# Patient Record
Sex: Female | Born: 1940
Health system: Southern US, Community
[De-identification: ages and names within clinical notes are randomized; demographics above are authoritative.]

## PROBLEM LIST (undated history)

## (undated) DIAGNOSIS — M1990 Unspecified osteoarthritis, unspecified site: Secondary | ICD-10-CM

## (undated) DIAGNOSIS — I10 Essential (primary) hypertension: Secondary | ICD-10-CM

## (undated) DIAGNOSIS — E079 Disorder of thyroid, unspecified: Secondary | ICD-10-CM

## (undated) DIAGNOSIS — Z8739 Personal history of other diseases of the musculoskeletal system and connective tissue: Secondary | ICD-10-CM

## (undated) DIAGNOSIS — I341 Nonrheumatic mitral (valve) prolapse: Secondary | ICD-10-CM

## (undated) DIAGNOSIS — K635 Polyp of colon: Secondary | ICD-10-CM

## (undated) DIAGNOSIS — E785 Hyperlipidemia, unspecified: Secondary | ICD-10-CM

## (undated) DIAGNOSIS — N289 Disorder of kidney and ureter, unspecified: Secondary | ICD-10-CM

## (undated) DIAGNOSIS — E0590 Thyrotoxicosis, unspecified without thyrotoxic crisis or storm: Secondary | ICD-10-CM

## (undated) HISTORY — DX: Disorder of kidney and ureter, unspecified: N28.9

## (undated) HISTORY — PX: CHOLECYSTECTOMY: SHX55

## (undated) HISTORY — DX: Unspecified osteoarthritis, unspecified site: M19.90

## (undated) HISTORY — DX: Hyperlipidemia, unspecified: E78.5

## (undated) HISTORY — DX: Polyp of colon: K63.5

## (undated) HISTORY — DX: Essential (primary) hypertension: I10

## (undated) HISTORY — DX: Disorder of thyroid, unspecified: E07.9

## (undated) HISTORY — DX: Nonrheumatic mitral (valve) prolapse: I34.1

---

## 1999-11-16 ENCOUNTER — Ambulatory Visit (HOSPITAL_COMMUNITY): Admission: RE | Admit: 1999-11-16 | Discharge: 1999-11-16 | Payer: Self-pay | Admitting: Gastroenterology

## 2001-05-20 ENCOUNTER — Encounter: Payer: Self-pay | Admitting: Internal Medicine

## 2001-05-20 ENCOUNTER — Encounter: Admission: RE | Admit: 2001-05-20 | Discharge: 2001-05-20 | Payer: Self-pay | Admitting: Internal Medicine

## 2002-04-05 ENCOUNTER — Other Ambulatory Visit: Admission: RE | Admit: 2002-04-05 | Discharge: 2002-04-05 | Payer: Self-pay | Admitting: Internal Medicine

## 2003-03-22 ENCOUNTER — Other Ambulatory Visit: Admission: RE | Admit: 2003-03-22 | Discharge: 2003-03-22 | Payer: Self-pay | Admitting: Internal Medicine

## 2004-03-27 ENCOUNTER — Other Ambulatory Visit: Admission: RE | Admit: 2004-03-27 | Discharge: 2004-03-27 | Payer: Self-pay | Admitting: Internal Medicine

## 2005-04-02 ENCOUNTER — Other Ambulatory Visit: Admission: RE | Admit: 2005-04-02 | Discharge: 2005-04-02 | Payer: Self-pay | Admitting: Internal Medicine

## 2006-04-10 ENCOUNTER — Other Ambulatory Visit: Admission: RE | Admit: 2006-04-10 | Discharge: 2006-04-10 | Payer: Self-pay | Admitting: Internal Medicine

## 2007-10-24 ENCOUNTER — Encounter: Admission: RE | Admit: 2007-10-24 | Discharge: 2007-10-24 | Payer: Self-pay | Admitting: Internal Medicine

## 2007-11-27 ENCOUNTER — Inpatient Hospital Stay (HOSPITAL_COMMUNITY): Admission: RE | Admit: 2007-11-27 | Discharge: 2007-11-28 | Payer: Self-pay | Admitting: Neurosurgery

## 2007-12-17 HISTORY — PX: NECK SURGERY: SHX720

## 2008-04-29 ENCOUNTER — Encounter: Admission: RE | Admit: 2008-04-29 | Discharge: 2008-04-29 | Payer: Self-pay | Admitting: Internal Medicine

## 2008-07-29 ENCOUNTER — Encounter: Admission: RE | Admit: 2008-07-29 | Discharge: 2008-07-29 | Payer: Self-pay | Admitting: Internal Medicine

## 2008-09-19 ENCOUNTER — Ambulatory Visit: Payer: Self-pay | Admitting: Internal Medicine

## 2008-10-17 ENCOUNTER — Ambulatory Visit: Payer: Self-pay | Admitting: Internal Medicine

## 2009-04-17 ENCOUNTER — Ambulatory Visit: Payer: Self-pay | Admitting: Internal Medicine

## 2009-05-16 ENCOUNTER — Ambulatory Visit: Payer: Self-pay | Admitting: Internal Medicine

## 2009-08-08 ENCOUNTER — Ambulatory Visit: Payer: Self-pay | Admitting: Internal Medicine

## 2009-11-07 ENCOUNTER — Other Ambulatory Visit: Admission: RE | Admit: 2009-11-07 | Discharge: 2009-11-07 | Payer: Self-pay | Admitting: Internal Medicine

## 2009-11-07 ENCOUNTER — Ambulatory Visit: Payer: Self-pay | Admitting: Internal Medicine

## 2009-11-07 LAB — HM PAP SMEAR

## 2009-12-16 HISTORY — PX: BACK SURGERY: SHX140

## 2010-05-08 ENCOUNTER — Encounter: Payer: Self-pay | Admitting: Gastroenterology

## 2010-05-08 ENCOUNTER — Ambulatory Visit: Payer: Self-pay | Admitting: Internal Medicine

## 2010-05-08 ENCOUNTER — Encounter: Admission: RE | Admit: 2010-05-08 | Discharge: 2010-05-08 | Payer: Self-pay | Admitting: Internal Medicine

## 2010-05-18 ENCOUNTER — Ambulatory Visit: Payer: Self-pay | Admitting: Internal Medicine

## 2010-05-20 ENCOUNTER — Encounter: Admission: RE | Admit: 2010-05-20 | Discharge: 2010-05-20 | Payer: Self-pay | Admitting: Internal Medicine

## 2010-06-08 ENCOUNTER — Encounter: Payer: Self-pay | Admitting: Gastroenterology

## 2010-06-08 ENCOUNTER — Inpatient Hospital Stay (HOSPITAL_COMMUNITY): Admission: RE | Admit: 2010-06-08 | Discharge: 2010-06-12 | Payer: Self-pay | Admitting: Neurosurgery

## 2010-12-06 ENCOUNTER — Encounter: Payer: Self-pay | Admitting: Gastroenterology

## 2010-12-11 ENCOUNTER — Ambulatory Visit: Payer: Self-pay | Admitting: Internal Medicine

## 2010-12-11 ENCOUNTER — Encounter: Payer: Self-pay | Admitting: Gastroenterology

## 2010-12-20 ENCOUNTER — Ambulatory Visit
Admission: RE | Admit: 2010-12-20 | Discharge: 2010-12-20 | Payer: Self-pay | Source: Home / Self Care | Attending: Internal Medicine | Admitting: Internal Medicine

## 2010-12-25 ENCOUNTER — Encounter: Payer: Self-pay | Admitting: Gastroenterology

## 2010-12-25 ENCOUNTER — Encounter
Admission: RE | Admit: 2010-12-25 | Discharge: 2010-12-25 | Payer: Self-pay | Source: Home / Self Care | Attending: Internal Medicine | Admitting: Internal Medicine

## 2010-12-26 ENCOUNTER — Encounter
Admission: RE | Admit: 2010-12-26 | Discharge: 2010-12-26 | Payer: Self-pay | Source: Home / Self Care | Attending: Internal Medicine | Admitting: Internal Medicine

## 2010-12-26 ENCOUNTER — Encounter: Payer: Self-pay | Admitting: Gastroenterology

## 2010-12-28 ENCOUNTER — Encounter (INDEPENDENT_AMBULATORY_CARE_PROVIDER_SITE_OTHER): Payer: Self-pay | Admitting: *Deleted

## 2010-12-28 ENCOUNTER — Telehealth (INDEPENDENT_AMBULATORY_CARE_PROVIDER_SITE_OTHER): Payer: Self-pay | Admitting: *Deleted

## 2010-12-28 DIAGNOSIS — R933 Abnormal findings on diagnostic imaging of other parts of digestive tract: Secondary | ICD-10-CM | POA: Insufficient documentation

## 2011-01-03 ENCOUNTER — Encounter: Payer: Self-pay | Admitting: Gastroenterology

## 2011-01-03 ENCOUNTER — Ambulatory Visit (HOSPITAL_COMMUNITY)
Admission: RE | Admit: 2011-01-03 | Discharge: 2011-01-03 | Payer: Self-pay | Source: Home / Self Care | Attending: Gastroenterology | Admitting: Gastroenterology

## 2011-01-16 HISTORY — PX: OTHER SURGICAL HISTORY: SHX169

## 2011-01-17 NOTE — Letter (Signed)
Summary: Date Range: 11-07-09 to 12-11-10/Mary Parke Simmers MD  Date Range: 11-07-09 to 12-11-10/Mary Parke Simmers MD   Imported By: Phillis Knack 01/02/2011 15:19:48  _____________________________________________________________________  External Attachment:    Type:   Image     Comment:   External Document

## 2011-01-17 NOTE — Miscellaneous (Signed)
Summary: rx  Clinical Lists Changes  Medications: Added new medication of CIPROFLOXACIN HCL 500 MG  TABS (CIPROFLOXACIN HCL) Take 1 twice a day for 3 days - Signed Rx of CIPROFLOXACIN HCL 500 MG  TABS (CIPROFLOXACIN HCL) Take 1 twice a day for 3 days;  #6 x 0;  Signed;  Entered by: Milus Banister MD;  Authorized by: Milus Banister MD;  Method used: Print then Give to Patient    Prescriptions: CIPROFLOXACIN HCL 500 MG  TABS (CIPROFLOXACIN HCL) Take 1 twice a day for 3 days  #6 x 0   Entered and Authorized by:   Milus Banister MD   Signed by:   Milus Banister MD on 01/03/2011   Method used:   Print then Give to Patient   RxID:   [id number]

## 2011-01-17 NOTE — Progress Notes (Signed)
Summary: EUS  Phone Note Outgoing Call Call back at Home Phone [phone]   Call placed by: Christian Mate CMA Deborra Medina),  December 28, 2010 1:27 PM Summary of Call: pt scheduled for EUS, reviewed  meds and instructed   she will call with any questions Initial call taken by: Christian Mate CMA ([place]),  December 28, 2010 1:28 PM  New Problems: NONSPECIFIC ABN FINDING RAD & OTH EXAM GI TRACT (ICD-793.4)   New Problems: NONSPECIFIC ABN FINDING RAD & OTH EXAM GI TRACT (ICD-793.4)

## 2011-01-17 NOTE — Procedures (Addendum)
Summary: EUS  ENDOSCOPIC ULTRASOUND PROCEDURE REPORT  PATIENT:  [name], [name]  MR#:  [id number] BIRTHDATE:   1941/12/12   GENDER:   female ENDOSCOPIST:   Milus Banister, MD REFERRED BY: Emeline General, M.D. PROCEDURE DATE:  01/03/2011 PROCEDURE:  Upper EUS w/FNA ASA CLASS:   Class II INDICATIONS: weight loss, anorexia; large solid/cystic mass in pancreatic head  (non-dilated CBD) MEDICATIONS:    Fentanyl 125 mcg IV, Versed 10 mg IV, cipro 400mg  IV  DESCRIPTION OF PROCEDURE:   After the risks benefits and alternatives of the procedure were  explained, informed consent was obtained. The patient was then placed in the left, lateral, decubitus postion and IV sedation was administered. Throughout the procedure, the patient's blood pressure, pulse and oxygen saturations were monitored continuously.  Under direct visualization, the  endoscope was introduced through the mouth and advanced to the second portion of the duodenum.  Water was used as necessary to provide an acoustic interface.  Upon completion of the imaging, water was removed and the patient was sent to the recovery room in satisfactory condition. <<PROCEDUREIMAGES>>              <<OLD IMAGES>>  Endoscopic findings: 1. Normal esophagus, stomach, duodenum  EUS findings: 1. Large solid/cystic mass in region of head of pancreas. This measured at least 7cm across. The solid component was heterogenous, nearly isoechoic, irregular. The cystic component had several thin septea.  The large size of the mass blocked complete visualization of the surrounding blood vessels.  The mass was more vascular than is typical for adenocarcinoma, was sampled with 2 passes with a 22 gauge BS EUS FNA needle. 2. The main pancreatic duct was dilated up to 7.61mm in body of pancreas. 3. Very limited views of liver, spleen, portal and splenic vessels were normal  Impression: Large cystic/solid mass in head of pancreas without significant biliary obstruction or  clear metastatic disease.  The solid component of the mass was not typical appearing for adenocarcinoma (more hyperechoic, more vascular).  FNA performed, await final report.  I am suspicious this is a large IPMN.  I have discussed this with Dr. Renold Genta and will be setting up referral to Dr. Stark Klein to consider resection.  She will complete 3 days of cipro.   _______________________________ Milus Banister, MD  Appended Document: EUS she needs referral to Stark Klein at [facility] [facility] for pancreatic head mass, consider resection  Appended Document: EUS Blanca at [place] not available to speak with I have left her a voicemail asking her to call me with an appointment date and time.  I have faxed the patient records to [facility] [place].  Appended Document: Orders Update/CCS    Clinical Lists Changes  Orders: Added new Test order of [facility] ([facility]) - Signed

## 2011-01-17 NOTE — Letter (Signed)
Summary: Medical Hx/Mary Parke Simmers MD  Medical Hx/Mary Parke Simmers MD   Imported By: Phillis Knack 01/02/2011 15:26:43  _____________________________________________________________________  External Attachment:    Type:   Image     Comment:   External Document

## 2011-01-17 NOTE — Letter (Signed)
Summary: EGD Instructions  [facility]  [place], [place] [address]   Phone: [phone]  Fax: [phone]       Alexandra Martinec    12-Jun-1941    MRN: [id number]       Procedure Day /Date:01/03/11 THURS       Arrival Time: 130 pm     Procedure Time:230 pm     Location of Procedure:                     [facility] ( Outpatient Registration)    PREPARATION FOR ENDOSCOPY   On 01/03/11  THE DAY OF THE PROCEDURE:  1.   No solid foods, milk or milk products are allowed after midnight the night before your procedure.  2.   Do not drink anything colored red or purple.  Avoid juices with pulp.  No orange juice.  3.  You may drink clear liquids until 1030 am , which is 4 hours before your procedure.                                                                                                CLEAR LIQUIDS INCLUDE: Water Jello Ice Popsicles Tea (sugar ok, no milk/cream) Powdered fruit flavored drinks Coffee (sugar ok, no milk/cream) Gatorade Juice: apple, white grape, white cranberry  Lemonade Clear bullion, consomm, broth Carbonated beverages (any kind) Strained chicken noodle soup Hard Candy   MEDICATION INSTRUCTIONS  Unless otherwise instructed, you should take regular prescription medications with a small sip of water as early as possible the morning of your procedure.             OTHER INSTRUCTIONS  You will need a responsible adult at least 71 years of age to accompany you and drive you home.   This person must remain in the waiting room during your procedure.  Wear loose fitting clothing that is easily removed.  Leave jewelry and other valuables at home.  However, you may wish to bring a book to read or an iPod/MP3 player to listen to music as you wait for your procedure to start.  Remove all body piercing jewelry and leave at home.  Total time from sign-in until discharge is approximately 2-3 hours.  You should go  home directly after your procedure and rest.  You can resume normal activities the day after your procedure.  The day of your procedure you should not:   Drive   Make legal decisions   Operate machinery   Drink alcohol   Return to work  You will receive specific instructions about eating, activities and medications before you leave.    The above instructions have been reviewed and explained to me by   _______________________    I fully understand and can verbalize these instructions _____________________________ Date _________

## 2011-01-17 NOTE — Procedures (Signed)
Summary: Endoscopic Ultrasound  Patient: [name] Note: All result statuses are Final unless otherwise noted.  Tests: (1) Endoscopic Ultrasound (EUS)  EUS Endoscopic Ultrasound                             DONE     [facility]     [place], [place]  [id number]           ENDOSCOPIC ULTRASOUND PROCEDURE REPORT           PATIENT:  Gomez, Sierra  MR#:  [id number]     BIRTHDATE:  [birth year]  GENDER:  female     ENDOSCOPIST:  Milus Banister, MD     REFERRED BY:  Emeline General, M.D.     PROCEDURE DATE:  01/03/2011     PROCEDURE:  Upper EUS w/FNA     ASA CLASS:  Class II     INDICATIONS:  weight loss, anorexia; large solid/cystic mass in     pancreatic head  (non-dilated CBD)     MEDICATIONS:   Fentanyl 125 mcg IV, Versed 10 mg IV, cipro 400mg      IV           DESCRIPTION OF PROCEDURE:   After the risks benefits and     alternatives of the procedure were  explained, informed consent     was obtained. The patient was then placed in the left, lateral,     decubitus postion and IV sedation was administered. Throughout the     procedure, the patient's blood pressure, pulse and oxygen     saturations were monitored continuously.  Under direct     visualization, the  endoscope was introduced through the mouth and     advanced to the second portion of the duodenum.  Water was used as     necessary to provide an acoustic interface.  Upon completion of     the imaging, water was removed and the patient was sent to the     recovery room in satisfactory condition.     <<PROCEDUREIMAGES>>           Endoscopic findings:     1. Normal esophagus, stomach, duodenum           EUS findings:     1. Large solid/cystic mass in region of head of pancreas. This     measured at least 7cm across. The solid component was     heterogenous, nearly isoechoic, irregular. The cystic component     had several thin septea.  The large size of the mass blocked     complete  visualization of the surrounding blood vessels.  The mass     was more vascular than is typical for adenocarcinoma, was sampled     with 2 passes with a 22 gauge BS EUS FNA needle.     2. The main pancreatic duct was dilated up to 7.48mm in body of     pancreas.     3. Very limited views of liver, spleen, portal and splenic vessels     were normal           Impression:     Large cystic/solid mass in head of pancreas without significant     biliary obstruction or clear metastatic disease.  The solid     component of the mass was not typical appearing for adenocarcinoma     (  more hyperechoic, more vascular).  FNA performed, await final     report.  I am suspicious this is a large IPMN.  I have discussed     this with Dr. Renold Genta and will be setting up referral to Dr. Stark Klein to consider resection.  She will complete 3 days of cipro.           ______________________________     Milus Banister, MD           n.     eSIGNED:   Milus Banister at 01/03/2011 03:57 PM           Ashby Dawes, [id number]-  Note: An exclamation mark (!) indicates a result that was not dispersed into the flowsheet. Document Creation Date: 01/06/2011 8:24 PM _______________________________________________________________________  (1) Order result status: Final Collection or observation date-time: 01/03/2011 15:35 Requested date-time:  Receipt date-time:  Reported date-time:  Referring Physician:   Ordering Physician: Owens Loffler [phone]) Specimen Source:  Source: Tawanna Cooler Order Number: [id number] Lab site:

## 2011-01-18 ENCOUNTER — Other Ambulatory Visit: Payer: Self-pay | Admitting: General Surgery

## 2011-01-18 ENCOUNTER — Encounter (HOSPITAL_COMMUNITY): Payer: Medicare Other

## 2011-01-18 DIAGNOSIS — K869 Disease of pancreas, unspecified: Secondary | ICD-10-CM | POA: Insufficient documentation

## 2011-01-18 DIAGNOSIS — Z01812 Encounter for preprocedural laboratory examination: Secondary | ICD-10-CM | POA: Insufficient documentation

## 2011-01-18 LAB — COMPREHENSIVE METABOLIC PANEL
ALT: 12 U/L (ref 0–35)
AST: 19 U/L (ref 0–37)
Albumin: 3.7 g/dL (ref 3.5–5.2)
Alkaline Phosphatase: 83 U/L (ref 39–117)
BUN: 20 mg/dL (ref 6–23)
CO2: 28 mEq/L (ref 19–32)
Calcium: 10.7 mg/dL — ABNORMAL HIGH (ref 8.4–10.5)
Chloride: 102 mEq/L (ref 96–112)
Creatinine, Ser: 1.12 mg/dL (ref 0.4–1.2)
GFR calc Af Amer: 58 mL/min — ABNORMAL LOW (ref >60)
GFR calc non Af Amer: 48 mL/min — ABNORMAL LOW (ref >60)
Glucose, Bld: 97 mg/dL (ref 70–99)
Potassium: 4 mEq/L (ref 3.5–5.1)
Sodium: 139 mEq/L (ref 135–145)
Total Bilirubin: 0.8 mg/dL (ref 0.3–1.2)
Total Protein: 6.8 g/dL (ref 6.0–8.3)

## 2011-01-18 LAB — CBC
HCT: 39.3 % (ref 36.0–46.0)
Hemoglobin: 13.7 g/dL (ref 12.0–15.0)
MCH: 30.2 pg (ref 26.0–34.0)
MCHC: 34.9 g/dL (ref 30.0–36.0)
MCV: 86.6 fL (ref 78.0–100.0)
Platelets: 198 10*3/uL (ref 150–400)
RBC: 4.54 MIL/uL (ref 3.87–5.11)
RDW: 13.2 % (ref 11.5–15.5)
WBC: 7 10*3/uL (ref 4.0–10.5)

## 2011-01-18 LAB — DIFFERENTIAL
Basophils Absolute: 0 10*3/uL (ref 0.0–0.1)
Basophils Relative: 0 % (ref 0–1)
Eosinophils Absolute: 0.1 10*3/uL (ref 0.0–0.7)
Eosinophils Relative: 1 % (ref 0–5)
Lymphocytes Relative: 35 % (ref 12–46)
Lymphs Abs: 2.5 10*3/uL (ref 0.7–4.0)
Monocytes Absolute: 0.7 10*3/uL (ref 0.1–1.0)
Monocytes Relative: 9 % (ref 3–12)
Neutro Abs: 3.8 10*3/uL (ref 1.7–7.7)
Neutrophils Relative %: 54 % (ref 43–77)

## 2011-01-18 LAB — PROTIME-INR
INR: 0.96 (ref 0.00–1.49)
Prothrombin Time: 13 seconds (ref 11.6–15.2)

## 2011-01-18 LAB — SURGICAL PCR SCREEN
MRSA, PCR: NEGATIVE
Staphylococcus aureus: NEGATIVE

## 2011-01-18 LAB — APTT: aPTT: 30 seconds (ref 24–37)

## 2011-01-19 LAB — ABO/RH: ABO/RH(D): B POS

## 2011-01-19 LAB — CEA: CEA: 1.4 ng/mL (ref 0.0–5.0)

## 2011-01-19 LAB — CANCER ANTIGEN 19-9: CA 19-9: 201 U/mL — ABNORMAL HIGH (ref <35.0)

## 2011-01-22 ENCOUNTER — Inpatient Hospital Stay (HOSPITAL_COMMUNITY): Payer: Medicare Other

## 2011-01-22 ENCOUNTER — Other Ambulatory Visit: Payer: Self-pay | Admitting: General Surgery

## 2011-01-22 ENCOUNTER — Inpatient Hospital Stay (HOSPITAL_COMMUNITY)
Admission: RE | Admit: 2011-01-22 | Discharge: 2011-02-04 | DRG: 406 | Disposition: A | Discharge status: Skilled Nursing Facility | Payer: Medicare Other | Attending: General Surgery | Admitting: General Surgery

## 2011-01-22 DIAGNOSIS — F3289 Other specified depressive episodes: Secondary | ICD-10-CM | POA: Diagnosis present

## 2011-01-22 DIAGNOSIS — I10 Essential (primary) hypertension: Secondary | ICD-10-CM | POA: Diagnosis present

## 2011-01-22 DIAGNOSIS — Z981 Arthrodesis status: Secondary | ICD-10-CM

## 2011-01-22 DIAGNOSIS — R7309 Other abnormal glucose: Secondary | ICD-10-CM | POA: Diagnosis not present

## 2011-01-22 DIAGNOSIS — E8809 Other disorders of plasma-protein metabolism, not elsewhere classified: Secondary | ICD-10-CM | POA: Diagnosis not present

## 2011-01-22 DIAGNOSIS — K560 Paralytic ileus: Secondary | ICD-10-CM | POA: Diagnosis not present

## 2011-01-22 DIAGNOSIS — K8010 Calculus of gallbladder with chronic cholecystitis without obstruction: Secondary | ICD-10-CM | POA: Diagnosis present

## 2011-01-22 DIAGNOSIS — K869 Disease of pancreas, unspecified: Secondary | ICD-10-CM | POA: Diagnosis present

## 2011-01-22 DIAGNOSIS — D136 Benign neoplasm of pancreas: Principal | ICD-10-CM | POA: Diagnosis present

## 2011-01-22 DIAGNOSIS — E876 Hypokalemia: Secondary | ICD-10-CM | POA: Diagnosis not present

## 2011-01-22 DIAGNOSIS — E039 Hypothyroidism, unspecified: Secondary | ICD-10-CM | POA: Diagnosis present

## 2011-01-22 DIAGNOSIS — Z5189 Encounter for other specified aftercare: Secondary | ICD-10-CM

## 2011-01-22 DIAGNOSIS — Z01812 Encounter for preprocedural laboratory examination: Secondary | ICD-10-CM

## 2011-01-22 DIAGNOSIS — M109 Gout, unspecified: Secondary | ICD-10-CM | POA: Diagnosis present

## 2011-01-22 DIAGNOSIS — D62 Acute posthemorrhagic anemia: Secondary | ICD-10-CM | POA: Diagnosis not present

## 2011-01-22 DIAGNOSIS — I959 Hypotension, unspecified: Secondary | ICD-10-CM | POA: Diagnosis not present

## 2011-01-22 DIAGNOSIS — F329 Major depressive disorder, single episode, unspecified: Secondary | ICD-10-CM | POA: Diagnosis present

## 2011-01-22 LAB — PROTIME-INR
INR: 1.23 (ref 0.00–1.49)
Prothrombin Time: 15.7 seconds — ABNORMAL HIGH (ref 11.6–15.2)

## 2011-01-22 LAB — CBC
HCT: 27.7 % — ABNORMAL LOW (ref 36.0–46.0)
Hemoglobin: 9.5 g/dL — ABNORMAL LOW (ref 12.0–15.0)
MCH: 29.7 pg (ref 26.0–34.0)
MCHC: 34.3 g/dL (ref 30.0–36.0)
MCV: 86.6 fL (ref 78.0–100.0)
Platelets: 138 10*3/uL — ABNORMAL LOW (ref 150–400)
RBC: 3.2 MIL/uL — ABNORMAL LOW (ref 3.87–5.11)
RDW: 13.1 % (ref 11.5–15.5)
WBC: 10.2 10*3/uL (ref 4.0–10.5)

## 2011-01-22 LAB — COMPREHENSIVE METABOLIC PANEL
ALT: 21 U/L (ref 0–35)
AST: 33 U/L (ref 0–37)
Albumin: 2.2 g/dL — ABNORMAL LOW (ref 3.5–5.2)
Alkaline Phosphatase: 50 U/L (ref 39–117)
BUN: 16 mg/dL (ref 6–23)
CO2: 26 mEq/L (ref 19–32)
Calcium: 8.6 mg/dL (ref 8.4–10.5)
Chloride: 110 mEq/L (ref 96–112)
Creatinine, Ser: 1 mg/dL (ref 0.4–1.2)
GFR calc Af Amer: >60 mL/min (ref >60)
GFR calc non Af Amer: 55 mL/min — ABNORMAL LOW (ref >60)
Glucose, Bld: 140 mg/dL — ABNORMAL HIGH (ref 70–99)
Potassium: 3.4 mEq/L — ABNORMAL LOW (ref 3.5–5.1)
Sodium: 140 mEq/L (ref 135–145)
Total Bilirubin: 0.4 mg/dL (ref 0.3–1.2)
Total Protein: 3.8 g/dL — ABNORMAL LOW (ref 6.0–8.3)

## 2011-01-22 LAB — MAGNESIUM: Magnesium: 1.4 mg/dL — ABNORMAL LOW (ref 1.5–2.5)

## 2011-01-22 LAB — GLUCOSE, CAPILLARY: Glucose-Capillary: 116 mg/dL — ABNORMAL HIGH (ref 70–99)

## 2011-01-22 LAB — HEMOGLOBIN A1C
Hgb A1c MFr Bld: 5.8 % — ABNORMAL HIGH (ref <5.7)
Mean Plasma Glucose: 120 mg/dL — ABNORMAL HIGH (ref <117)

## 2011-01-22 LAB — APTT: aPTT: 28 seconds (ref 24–37)

## 2011-01-22 LAB — PHOSPHORUS: Phosphorus: 3.5 mg/dL (ref 2.3–4.6)

## 2011-01-23 LAB — CBC
HCT: 27.3 % — ABNORMAL LOW (ref 36.0–46.0)
HCT: 23.1 % — ABNORMAL LOW (ref 36.0–46.0)
Hemoglobin: 9.3 g/dL — ABNORMAL LOW (ref 12.0–15.0)
Hemoglobin: 7.8 g/dL — ABNORMAL LOW (ref 12.0–15.0)
MCH: 29.8 pg (ref 26.0–34.0)
MCH: 30.1 pg (ref 26.0–34.0)
MCHC: 34.1 g/dL (ref 30.0–36.0)
MCHC: 33.8 g/dL (ref 30.0–36.0)
MCV: 87.5 fL (ref 78.0–100.0)
MCV: 89.2 fL (ref 78.0–100.0)
Platelets: 131 10*3/uL — ABNORMAL LOW (ref 150–400)
Platelets: 112 10*3/uL — ABNORMAL LOW (ref 150–400)
RBC: 3.12 MIL/uL — ABNORMAL LOW (ref 3.87–5.11)
RBC: 2.59 MIL/uL — ABNORMAL LOW (ref 3.87–5.11)
RDW: 13.2 % (ref 11.5–15.5)
RDW: 13.3 % (ref 11.5–15.5)
WBC: 7.5 10*3/uL (ref 4.0–10.5)
WBC: 7 10*3/uL (ref 4.0–10.5)

## 2011-01-23 LAB — COMPREHENSIVE METABOLIC PANEL
ALT: 21 U/L (ref 0–35)
AST: 34 U/L (ref 0–37)
Albumin: 2 g/dL — ABNORMAL LOW (ref 3.5–5.2)
Alkaline Phosphatase: 45 U/L (ref 39–117)
BUN: 12 mg/dL (ref 6–23)
CO2: 28 mEq/L (ref 19–32)
Calcium: 8.5 mg/dL (ref 8.4–10.5)
Chloride: 107 mEq/L (ref 96–112)
Creatinine, Ser: 0.78 mg/dL (ref 0.4–1.2)
GFR calc Af Amer: >60 mL/min (ref >60)
GFR calc non Af Amer: >60 mL/min (ref >60)
Glucose, Bld: 104 mg/dL — ABNORMAL HIGH (ref 70–99)
Potassium: 3.6 mEq/L (ref 3.5–5.1)
Sodium: 140 mEq/L (ref 135–145)
Total Bilirubin: 0.7 mg/dL (ref 0.3–1.2)
Total Protein: 3.8 g/dL — ABNORMAL LOW (ref 6.0–8.3)

## 2011-01-23 LAB — GLUCOSE, CAPILLARY
Glucose-Capillary: 101 mg/dL — ABNORMAL HIGH (ref 70–99)
Glucose-Capillary: 107 mg/dL — ABNORMAL HIGH (ref 70–99)
Glucose-Capillary: 107 mg/dL — ABNORMAL HIGH (ref 70–99)
Glucose-Capillary: 113 mg/dL — ABNORMAL HIGH (ref 70–99)
Glucose-Capillary: 82 mg/dL (ref 70–99)
Glucose-Capillary: 95 mg/dL (ref 70–99)

## 2011-01-23 LAB — PHOSPHORUS: Phosphorus: 3.4 mg/dL (ref 2.3–4.6)

## 2011-01-23 LAB — PROTIME-INR
INR: 1.19 (ref 0.00–1.49)
Prothrombin Time: 15.3 seconds — ABNORMAL HIGH (ref 11.6–15.2)

## 2011-01-23 LAB — MAGNESIUM: Magnesium: 1.5 mg/dL (ref 1.5–2.5)

## 2011-01-23 LAB — APTT: aPTT: 31 seconds (ref 24–37)

## 2011-01-24 LAB — CBC
HCT: 21.5 % — ABNORMAL LOW (ref 36.0–46.0)
HCT: 30.2 % — ABNORMAL LOW (ref 36.0–46.0)
Hemoglobin: 7.4 g/dL — ABNORMAL LOW (ref 12.0–15.0)
Hemoglobin: 10.3 g/dL — ABNORMAL LOW (ref 12.0–15.0)
MCH: 30.6 pg (ref 26.0–34.0)
MCH: 30.1 pg (ref 26.0–34.0)
MCHC: 34.4 g/dL (ref 30.0–36.0)
MCHC: 34.1 g/dL (ref 30.0–36.0)
MCV: 88.8 fL (ref 78.0–100.0)
MCV: 88.3 fL (ref 78.0–100.0)
Platelets: 99 10*3/uL — ABNORMAL LOW (ref 150–400)
Platelets: 106 10*3/uL — ABNORMAL LOW (ref 150–400)
RBC: 2.42 MIL/uL — ABNORMAL LOW (ref 3.87–5.11)
RBC: 3.42 MIL/uL — ABNORMAL LOW (ref 3.87–5.11)
RDW: 13.6 % (ref 11.5–15.5)
RDW: 13.7 % (ref 11.5–15.5)
WBC: 5.8 10*3/uL (ref 4.0–10.5)
WBC: 6.5 10*3/uL (ref 4.0–10.5)

## 2011-01-24 LAB — COMPREHENSIVE METABOLIC PANEL
ALT: 21 U/L (ref 0–35)
AST: 37 U/L (ref 0–37)
Albumin: 2.6 g/dL — ABNORMAL LOW (ref 3.5–5.2)
Alkaline Phosphatase: 37 U/L — ABNORMAL LOW (ref 39–117)
BUN: 8 mg/dL (ref 6–23)
CO2: 26 mEq/L (ref 19–32)
Calcium: 8.9 mg/dL (ref 8.4–10.5)
Chloride: 112 mEq/L (ref 96–112)
Creatinine, Ser: 0.7 mg/dL (ref 0.4–1.2)
GFR calc Af Amer: >60 mL/min (ref >60)
GFR calc non Af Amer: >60 mL/min (ref >60)
Glucose, Bld: 101 mg/dL — ABNORMAL HIGH (ref 70–99)
Potassium: 4.2 mEq/L (ref 3.5–5.1)
Sodium: 140 mEq/L (ref 135–145)
Total Bilirubin: 0.5 mg/dL (ref 0.3–1.2)
Total Protein: 4.1 g/dL — ABNORMAL LOW (ref 6.0–8.3)

## 2011-01-24 LAB — GLUCOSE, CAPILLARY
Glucose-Capillary: 106 mg/dL — ABNORMAL HIGH (ref 70–99)
Glucose-Capillary: 110 mg/dL — ABNORMAL HIGH (ref 70–99)
Glucose-Capillary: 112 mg/dL — ABNORMAL HIGH (ref 70–99)
Glucose-Capillary: 96 mg/dL (ref 70–99)
Glucose-Capillary: 83 mg/dL (ref 70–99)
Glucose-Capillary: 117 mg/dL — ABNORMAL HIGH (ref 70–99)

## 2011-01-25 ENCOUNTER — Inpatient Hospital Stay (HOSPITAL_COMMUNITY): Payer: Medicare Other

## 2011-01-25 LAB — CROSSMATCH
ABO/RH(D): B POS
Antibody Screen: NEGATIVE
Unit division: 0
Unit division: 0
Unit division: 0
Unit division: 0

## 2011-01-25 LAB — CBC
HCT: 31.5 % — ABNORMAL LOW (ref 36.0–46.0)
Hemoglobin: 10.7 g/dL — ABNORMAL LOW (ref 12.0–15.0)
MCH: 29.8 pg (ref 26.0–34.0)
MCHC: 34 g/dL (ref 30.0–36.0)
MCV: 87.7 fL (ref 78.0–100.0)
Platelets: 121 10*3/uL — ABNORMAL LOW (ref 150–400)
RBC: 3.59 MIL/uL — ABNORMAL LOW (ref 3.87–5.11)
RDW: 13.4 % (ref 11.5–15.5)
WBC: 6.9 10*3/uL (ref 4.0–10.5)

## 2011-01-25 LAB — PHOSPHORUS: Phosphorus: 2 mg/dL — ABNORMAL LOW (ref 2.3–4.6)

## 2011-01-25 LAB — BASIC METABOLIC PANEL
BUN: 7 mg/dL (ref 6–23)
CO2: 25 mEq/L (ref 19–32)
Calcium: 9 mg/dL (ref 8.4–10.5)
Chloride: 109 mEq/L (ref 96–112)
Creatinine, Ser: 0.83 mg/dL (ref 0.4–1.2)
GFR calc Af Amer: >60 mL/min (ref >60)
GFR calc non Af Amer: >60 mL/min (ref >60)
Glucose, Bld: 108 mg/dL — ABNORMAL HIGH (ref 70–99)
Potassium: 4 mEq/L (ref 3.5–5.1)
Sodium: 140 mEq/L (ref 135–145)

## 2011-01-25 LAB — GLUCOSE, CAPILLARY
Glucose-Capillary: 102 mg/dL — ABNORMAL HIGH (ref 70–99)
Glucose-Capillary: 115 mg/dL — ABNORMAL HIGH (ref 70–99)
Glucose-Capillary: 139 mg/dL — ABNORMAL HIGH (ref 70–99)
Glucose-Capillary: 107 mg/dL — ABNORMAL HIGH (ref 70–99)
Glucose-Capillary: 102 mg/dL — ABNORMAL HIGH (ref 70–99)
Glucose-Capillary: 108 mg/dL — ABNORMAL HIGH (ref 70–99)

## 2011-01-25 LAB — MAGNESIUM: Magnesium: 1.8 mg/dL (ref 1.5–2.5)

## 2011-01-26 LAB — CBC
HCT: 32.3 % — ABNORMAL LOW (ref 36.0–46.0)
Hemoglobin: 11 g/dL — ABNORMAL LOW (ref 12.0–15.0)
MCH: 30.1 pg (ref 26.0–34.0)
MCHC: 34.1 g/dL (ref 30.0–36.0)
MCV: 88.3 fL (ref 78.0–100.0)
Platelets: 131 10*3/uL — ABNORMAL LOW (ref 150–400)
RBC: 3.66 MIL/uL — ABNORMAL LOW (ref 3.87–5.11)
RDW: 13.3 % (ref 11.5–15.5)
WBC: 6.4 10*3/uL (ref 4.0–10.5)

## 2011-01-26 LAB — BASIC METABOLIC PANEL
BUN: 4 mg/dL — ABNORMAL LOW (ref 6–23)
CO2: 26 mEq/L (ref 19–32)
Calcium: 9.1 mg/dL (ref 8.4–10.5)
Chloride: 111 mEq/L (ref 96–112)
Creatinine, Ser: 0.77 mg/dL (ref 0.4–1.2)
GFR calc Af Amer: >60 mL/min (ref >60)
GFR calc non Af Amer: >60 mL/min (ref >60)
Glucose, Bld: 106 mg/dL — ABNORMAL HIGH (ref 70–99)
Potassium: 4.3 mEq/L (ref 3.5–5.1)
Sodium: 140 mEq/L (ref 135–145)

## 2011-01-26 LAB — GLUCOSE, CAPILLARY
Glucose-Capillary: 104 mg/dL — ABNORMAL HIGH (ref 70–99)
Glucose-Capillary: 106 mg/dL — ABNORMAL HIGH (ref 70–99)
Glucose-Capillary: 107 mg/dL — ABNORMAL HIGH (ref 70–99)
Glucose-Capillary: 109 mg/dL — ABNORMAL HIGH (ref 70–99)
Glucose-Capillary: 112 mg/dL — ABNORMAL HIGH (ref 70–99)
Glucose-Capillary: 116 mg/dL — ABNORMAL HIGH (ref 70–99)

## 2011-01-26 LAB — MAGNESIUM: Magnesium: 1.7 mg/dL (ref 1.5–2.5)

## 2011-01-26 LAB — PHOSPHORUS: Phosphorus: 2.7 mg/dL (ref 2.3–4.6)

## 2011-01-27 LAB — GLUCOSE, CAPILLARY
Glucose-Capillary: 100 mg/dL — ABNORMAL HIGH (ref 70–99)
Glucose-Capillary: 102 mg/dL — ABNORMAL HIGH (ref 70–99)
Glucose-Capillary: 103 mg/dL — ABNORMAL HIGH (ref 70–99)
Glucose-Capillary: 105 mg/dL — ABNORMAL HIGH (ref 70–99)
Glucose-Capillary: 107 mg/dL — ABNORMAL HIGH (ref 70–99)
Glucose-Capillary: 108 mg/dL — ABNORMAL HIGH (ref 70–99)
Glucose-Capillary: 99 mg/dL (ref 70–99)

## 2011-01-28 LAB — COMPREHENSIVE METABOLIC PANEL
ALT: 12 U/L (ref 0–35)
AST: 17 U/L (ref 0–37)
Albumin: 2.4 g/dL — ABNORMAL LOW (ref 3.5–5.2)
Alkaline Phosphatase: 57 U/L (ref 39–117)
BUN: 5 mg/dL — ABNORMAL LOW (ref 6–23)
CO2: 25 mEq/L (ref 19–32)
Calcium: 9.5 mg/dL (ref 8.4–10.5)
Chloride: 110 mEq/L (ref 96–112)
Creatinine, Ser: 0.74 mg/dL (ref 0.4–1.2)
GFR calc Af Amer: >60 mL/min (ref >60)
GFR calc non Af Amer: >60 mL/min (ref >60)
Glucose, Bld: 105 mg/dL — ABNORMAL HIGH (ref 70–99)
Potassium: 4.5 mEq/L (ref 3.5–5.1)
Sodium: 140 mEq/L (ref 135–145)
Total Bilirubin: 0.4 mg/dL (ref 0.3–1.2)
Total Protein: 4.6 g/dL — ABNORMAL LOW (ref 6.0–8.3)

## 2011-01-28 LAB — GLUCOSE, CAPILLARY
Glucose-Capillary: 108 mg/dL — ABNORMAL HIGH (ref 70–99)
Glucose-Capillary: 101 mg/dL — ABNORMAL HIGH (ref 70–99)
Glucose-Capillary: 113 mg/dL — ABNORMAL HIGH (ref 70–99)
Glucose-Capillary: 100 mg/dL — ABNORMAL HIGH (ref 70–99)
Glucose-Capillary: 90 mg/dL (ref 70–99)

## 2011-01-28 LAB — CBC
HCT: 33 % — ABNORMAL LOW (ref 36.0–46.0)
Hemoglobin: 11.1 g/dL — ABNORMAL LOW (ref 12.0–15.0)
MCH: 30 pg (ref 26.0–34.0)
MCHC: 33.6 g/dL (ref 30.0–36.0)
MCV: 89.2 fL (ref 78.0–100.0)
Platelets: 141 10*3/uL — ABNORMAL LOW (ref 150–400)
RBC: 3.7 MIL/uL — ABNORMAL LOW (ref 3.87–5.11)
RDW: 13 % (ref 11.5–15.5)
WBC: 5.5 10*3/uL (ref 4.0–10.5)

## 2011-01-28 LAB — PREALBUMIN: Prealbumin: 11.7 mg/dL — ABNORMAL LOW (ref 17.0–34.0)

## 2011-01-29 LAB — GLUCOSE, CAPILLARY
Glucose-Capillary: 101 mg/dL — ABNORMAL HIGH (ref 70–99)
Glucose-Capillary: 101 mg/dL — ABNORMAL HIGH (ref 70–99)
Glucose-Capillary: 102 mg/dL — ABNORMAL HIGH (ref 70–99)
Glucose-Capillary: 114 mg/dL — ABNORMAL HIGH (ref 70–99)
Glucose-Capillary: 116 mg/dL — ABNORMAL HIGH (ref 70–99)
Glucose-Capillary: 60 mg/dL — ABNORMAL LOW (ref 70–99)
Glucose-Capillary: 96 mg/dL (ref 70–99)

## 2011-01-29 LAB — CBC
HCT: 32.3 % — ABNORMAL LOW (ref 36.0–46.0)
Hemoglobin: 10.8 g/dL — ABNORMAL LOW (ref 12.0–15.0)
MCH: 29.7 pg (ref 26.0–34.0)
MCHC: 33.4 g/dL (ref 30.0–36.0)
MCV: 88.7 fL (ref 78.0–100.0)
Platelets: 162 10*3/uL (ref 150–400)
RBC: 3.64 MIL/uL — ABNORMAL LOW (ref 3.87–5.11)
RDW: 13.1 % (ref 11.5–15.5)
WBC: 4.8 10*3/uL (ref 4.0–10.5)

## 2011-01-29 LAB — PHOSPHORUS: Phosphorus: 3.2 mg/dL (ref 2.3–4.6)

## 2011-01-29 LAB — BASIC METABOLIC PANEL
BUN: 7 mg/dL (ref 6–23)
CO2: 25 mEq/L (ref 19–32)
Calcium: 9.3 mg/dL (ref 8.4–10.5)
Chloride: 110 mEq/L (ref 96–112)
Creatinine, Ser: 0.83 mg/dL (ref 0.4–1.2)
GFR calc Af Amer: >60 mL/min (ref >60)
GFR calc non Af Amer: >60 mL/min (ref >60)
Glucose, Bld: 98 mg/dL (ref 70–99)
Potassium: 4 mEq/L (ref 3.5–5.1)
Sodium: 140 mEq/L (ref 135–145)

## 2011-01-29 LAB — MAGNESIUM: Magnesium: 1.6 mg/dL (ref 1.5–2.5)

## 2011-01-30 LAB — CBC
HCT: 31.8 % — ABNORMAL LOW (ref 36.0–46.0)
Hemoglobin: 10.6 g/dL — ABNORMAL LOW (ref 12.0–15.0)
MCH: 29.8 pg (ref 26.0–34.0)
MCHC: 33.3 g/dL (ref 30.0–36.0)
MCV: 89.3 fL (ref 78.0–100.0)
Platelets: 175 10*3/uL (ref 150–400)
RBC: 3.56 MIL/uL — ABNORMAL LOW (ref 3.87–5.11)
RDW: 13 % (ref 11.5–15.5)
WBC: 5 10*3/uL (ref 4.0–10.5)

## 2011-01-30 LAB — GLUCOSE, CAPILLARY
Glucose-Capillary: 110 mg/dL — ABNORMAL HIGH (ref 70–99)
Glucose-Capillary: 118 mg/dL — ABNORMAL HIGH (ref 70–99)
Glucose-Capillary: 102 mg/dL — ABNORMAL HIGH (ref 70–99)
Glucose-Capillary: 109 mg/dL — ABNORMAL HIGH (ref 70–99)

## 2011-01-30 LAB — COMPREHENSIVE METABOLIC PANEL
ALT: 13 U/L (ref 0–35)
AST: 16 U/L (ref 0–37)
Albumin: 2.4 g/dL — ABNORMAL LOW (ref 3.5–5.2)
Alkaline Phosphatase: 70 U/L (ref 39–117)
BUN: 7 mg/dL (ref 6–23)
CO2: 25 mEq/L (ref 19–32)
Calcium: 9.4 mg/dL (ref 8.4–10.5)
Chloride: 111 mEq/L (ref 96–112)
Creatinine, Ser: 0.83 mg/dL (ref 0.4–1.2)
GFR calc Af Amer: >60 mL/min (ref >60)
GFR calc non Af Amer: >60 mL/min (ref >60)
Glucose, Bld: 104 mg/dL — ABNORMAL HIGH (ref 70–99)
Potassium: 4 mEq/L (ref 3.5–5.1)
Sodium: 141 mEq/L (ref 135–145)
Total Bilirubin: 0.4 mg/dL (ref 0.3–1.2)
Total Protein: 4.7 g/dL — ABNORMAL LOW (ref 6.0–8.3)

## 2011-01-31 LAB — BASIC METABOLIC PANEL
BUN: 5 mg/dL — ABNORMAL LOW (ref 6–23)
CO2: 28 mEq/L (ref 19–32)
Calcium: 9.9 mg/dL (ref 8.4–10.5)
Chloride: 111 mEq/L (ref 96–112)
Creatinine, Ser: 0.91 mg/dL (ref 0.4–1.2)
GFR calc Af Amer: >60 mL/min (ref >60)
GFR calc non Af Amer: >60 mL/min (ref >60)
Glucose, Bld: 94 mg/dL (ref 70–99)
Potassium: 4.4 mEq/L (ref 3.5–5.1)
Sodium: 146 mEq/L — ABNORMAL HIGH (ref 135–145)

## 2011-01-31 LAB — CBC
HCT: 33 % — ABNORMAL LOW (ref 36.0–46.0)
Hemoglobin: 11 g/dL — ABNORMAL LOW (ref 12.0–15.0)
MCH: 29.6 pg (ref 26.0–34.0)
MCHC: 33.3 g/dL (ref 30.0–36.0)
MCV: 88.7 fL (ref 78.0–100.0)
Platelets: 197 10*3/uL (ref 150–400)
RBC: 3.72 MIL/uL — ABNORMAL LOW (ref 3.87–5.11)
RDW: 12.9 % (ref 11.5–15.5)
WBC: 5.9 10*3/uL (ref 4.0–10.5)

## 2011-01-31 LAB — GLUCOSE, CAPILLARY
Glucose-Capillary: 98 mg/dL (ref 70–99)
Glucose-Capillary: 110 mg/dL — ABNORMAL HIGH (ref 70–99)
Glucose-Capillary: 129 mg/dL — ABNORMAL HIGH (ref 70–99)
Glucose-Capillary: 105 mg/dL — ABNORMAL HIGH (ref 70–99)

## 2011-02-01 LAB — GLUCOSE, CAPILLARY
Glucose-Capillary: 124 mg/dL — ABNORMAL HIGH (ref 70–99)
Glucose-Capillary: 102 mg/dL — ABNORMAL HIGH (ref 70–99)
Glucose-Capillary: 96 mg/dL (ref 70–99)
Glucose-Capillary: 100 mg/dL — ABNORMAL HIGH (ref 70–99)

## 2011-02-02 LAB — GLUCOSE, CAPILLARY
Glucose-Capillary: 105 mg/dL — ABNORMAL HIGH (ref 70–99)
Glucose-Capillary: 113 mg/dL — ABNORMAL HIGH (ref 70–99)
Glucose-Capillary: 136 mg/dL — ABNORMAL HIGH (ref 70–99)
Glucose-Capillary: 111 mg/dL — ABNORMAL HIGH (ref 70–99)

## 2011-02-02 LAB — POTASSIUM: Potassium: 3.7 mEq/L (ref 3.5–5.1)

## 2011-02-03 LAB — GLUCOSE, CAPILLARY
Glucose-Capillary: 113 mg/dL — ABNORMAL HIGH (ref 70–99)
Glucose-Capillary: 123 mg/dL — ABNORMAL HIGH (ref 70–99)
Glucose-Capillary: 119 mg/dL — ABNORMAL HIGH (ref 70–99)
Glucose-Capillary: 117 mg/dL — ABNORMAL HIGH (ref 70–99)

## 2011-02-03 NOTE — Op Note (Signed)
NAME:  [name], [name]                  ACCOUNT NO.:  [id number]  MEDICAL RECORD NO.:  [id number]           PATIENT TYPE:  I  LOCATION:  0011                         FACILITY:  [facility]  PHYSICIAN:  Stark Klein, MD       DATE OF BIRTH:  1941-12-16  DATE OF PROCEDURE: DATE OF DISCHARGE:                              OPERATIVE REPORT   PREOPERATIVE DIAGNOSIS:  Pancreatic head mass.  POSTOPERATIVE DIAGNOSIS:  Pancreatic head mass.  PROCEDURE:  Pancreaticoduodenectomy, On-Q pain pump placement, pancreatic stent, biopsy of mesenteric nodule.  SURGEON:  Stark Klein, MD  ASSISTANT:  Darene Lamer. Hoxworth, MD  ANESTHESIA:  General and local.  FINDINGS:  Large cystic and solid pancreatic head mass peeled easily off the lateral border of the portal vein.  SPECIMEN:  Pancreaticoduodenectomy, gallbladder, omentum, and mesenteric nodule to Pathology.  FINDINGS:  Pancreatic duct margin clear of malignancy.  ESTIMATED BLOOD LOSS:  500 mL.  COMPLICATIONS:  None known.  PROCEDURE:  Ms. Mcalpine was identified in the holding area and taken to operating room where she was placed supine on the operating room table. General anesthesia was induced.  A central line had been placed in the preop holding area, and an arterial line was placed and a Foley catheter was placed.  The patient's abdomen was then prepped and draped in a sterile fashion.  Time-out was performed according to surgical safety check list.  When all was correct, we continued.  A midline incision was made from the xiphoid to just above the umbilicus.  The subcutaneous tissues were divided with the cautery, and the fascia was opened in the midline.  The peritoneum was held up with 2 tonsils and opened with the Metzenbaum scissors.  The peritoneum was opened up to the length of the fascial incision.  The liver was palpated as well as the diaphragm. There was no evidence of mesenteric nodules or liver nodules.  The incision was teed  off to the right with an incision as well with #10 blade.  All the muscle layers were opened with the cautery.  The skin flap was then sown up to the abdominal wall for retraction.  The Bookwalter was placed then for visualization.    The mass was noted to be mobile and elevated off the back of the retroperitoneum.  The gallbladder was clasped with a Kelly clamp and taken off the liver with cautery.  The Overholt was used to assist.  The cystic artery was divided with the Harmonic scalpel to stretch out the cystic duct.  The duodenum was then Kocherized and came up easily off the IVC.  Attention was then directed to the porta hepatis.  The common bile duct was skeletonized at the distal end and taken off the portal vein.  The gastroduodenal artery was located and isolated.  This was test-clamped, and a Doppler was used to confirm that there was flow to the right and left hepatic arteries.  The gastroduodenal artery was then doubly tied with 2-0 silks and then ligated, which I then divided.  A 4-0 Prolene was then used to suture ligate the gastroduodenal  stump, and this was then clipped as well with a locking hemoclip.  The anterior surface of the portal vein was immediately visible.  The hepatic artery was taken up off the anterior surface of portal vein.    The lesser sac was entered, and the posterior surface of the stomach was not adherent to the pancreas.  The inferior border of the pancreas was then opened up with the Overholt and the cautery.  The middle colic vein was identified going down to the superior mesenteric vein.  A Kelly clamp passed easily underneath the pancreas.  Decision was made to definitely proceed with a Whipple.  The ligament of Treitz was identified and 10 cm past the ligament of Treitz was isolated and stapled with a GIA.  The staple line of the distal jejunum was oversewn with a 3-0 PDS.  The Harmonic scalpel was then used to take down the mesenteric  attachments to the proximal jejunum, and the ligament Treitz was taken down with the cautery.  Once all the attachments were taken on this side, the duodenum was made sure it was fully Kocherized on the opposite side and the proximal jejunum pulled through to the right upper quadrant.  The stomach was then divided just proximal to the antrum with 2 loads of the GIA stapler. The proximal staple line was oversewn with the 3-0 PDS.  The hepatoduodenal ligament was taken down.  The common bile duct was clamped with a bulldog, and this was divided at a grossly negative site. Once this was fully flipped up, it was evident that there was a replaced right hepatic artery just lateral to the portal vein.  This did curve back toward the SMA at the top of the pancreas and did not go through the pancreas.    At this point, the pancreas was divided with a #10 blade while protecting the portal vein with the Kelly clamp.  The inferior border of the pancreas did require a silk stitch for hemostasis.  The other side was cauterized.  Progressively from caudal to cranial, the pancreas was taken off the SMA and the portal vein by isolating a small portion with the Overholt and then using a purple Hem-o-Lok.  The Harmonic scalpel was then used to divide the pancreatic parenchyma at the uncinate.  This was rotated under, and then was painstakingly divided in this way.  The smaller veins coming off the portal vein were clamped with green Hem-o- Loks.  Once the pancreas was completely off the portal vein, this was marked with a short stitch on the common bile duct, a long stitch on the pancreatic duct and a single stitch on the retroperitoneal margin.  This was passed off and sent for the pancreatic duct margin frozen section.   The right upper quadrant was then irrigated and packed with a sponge. This was then reexamined, and the distal jejunum was pulled through the area where the duodenum used to go through  and this laid in place.  The appropriate site for a choledochojejunostomy was identified and the jejunum was opened with the cautery.  This was approximately 1.2 cm in diameter.  This anastomosis was created with interrupted 4-0 PDS sutures.  The medial and lateral stitch were placed, and then the posterior corner stitches were taken with the knots on the outside.  An additional 2 stitches were placed posteriorly, full-thickness with tying down on the inside of the common bile duct.  These were all tied down, and the inner sutures were  cut.  The 2 corner sutures were left in place.  The anterior layer of the anastomosis was then created with an additional 4 sutures of interrupted 4-0 PDS and tied down.  After this was created, some anti-tension sutures were placed securing the jejunum up to the gallbladder fossa and the retroperitoneum.  The frozen section on the pancreatic duct was still not back, so the gastrojejunostomy was created next.  The jejunum was identified around 20 cm past the new ligament of Treitz.  This was secured to the posterior wall of the stomach with two 3-0 Vicryl sutures.  A gastrotomy and a jejunotomy were made with the cautery, and the 75-mm GIA was used to create anastomosis. The NG tube was pulled through and placed into the afferent limb of the jejunum.  This was secured into place.  A 3-0 PDS was then used to close the defect.  By this time, the frozen section margin had come back as negative for malignancy, and so the pancreaticojejunostomy was created. There was a small opening made at the side of the duct, which is approximately 6 mm.  The posterior layer was taken with interrupted 2-0 silk sutures.  These were tied down.  A 5-French pediatric feeding tube was used as a pancreatic stent, and this was placed.  Five interrupted 5- 0 PDS sutures were then used to create the pancreaticojejunostomy.  The anterior layer of 2-0 silk was then placed.  This was seen  to be hemostatic.  The right upper quadrant was irrigated, and gloves were changed.  Tisseel was placed over the pancreatic and biliary anastomoses.  Two drains were placed into the right upper quadrant with the lateral-most drain going posteriorly and the medial drain lying over the top of the biliary and pancreatic anastomoses.  The On-Q pain pump tunnelers were then placed on both sides of the incision.    The abdomen was then copiously irrigated, and the omentum was seen to be bleeding. This was then taken just for hemostasis.  There was still a reasonable portion of omentum still in place.  At this point, three #1 looped PDS sutures were used to close the fascia.  The lateral portion of the incision was done in 2 layers and the midline incision was done in 1 layer.  The skin was then irrigated and then closed with staples.  The drains were secured with 2-0 nylon sutures.  The On-Q pain pump catheters were advanced through the tunneler sheaths and secured in place with Steri-Strips.  The wounds were then cleaned, dried, and dressed with Covaderm, and the On-Q pain pump catheters were dressed with Tegaderm.  Needle, sponge, and instrument counts were correct x3. The patient was awakened from anesthesia and taken to the PACU in stable condition.     Stark Klein, MD     FB/MEDQ  D:  01/22/2011  T:  01/22/2011  Job:  [id number]  Electronically Signed by Stark Klein MD on 02/03/2011 12:49:14 AM

## 2011-02-04 LAB — GLUCOSE, CAPILLARY
Glucose-Capillary: 104 mg/dL — ABNORMAL HIGH (ref 70–99)
Glucose-Capillary: 102 mg/dL — ABNORMAL HIGH (ref 70–99)

## 2011-02-21 NOTE — Discharge Summary (Signed)
  NAMEElynn, [name] [place]                  ACCOUNT NO.:  [id number]  MEDICAL RECORD NO.:  [id number]           PATIENT TYPE:  I  LOCATION:  [id number]                         FACILITY[facility]  [facility]  PHYSICIAN:  Stark Klein, MD       DATE OF BIRTH:  Jul 10, 1941  DATE OF ADMISSION:  01/22/2011 DATE OF DISCHARGE:  02/04/2011                              DISCHARGE SUMMARY   PRINCIPAL DIAGNOSES:  Serous microcystic adenoma of the pancreas, chronic cholecystitis and cholelithiasis.  DISCHARGE MEDICATIONS: 1. Sertraline 50 mg once a day. 2. Allopurinol 100 mg once a day. 3. Atenolol 50 mg once a day. 4. Amlodipine 10 mg once a day. 5. Lasix 40 mg once a day. 6. Synthroid 112 mcg once a day. 7. Percocet 5/325 mg 1-2 tablets p.o. q.4 h. p.r.n. pain. 8. Pantoprazole 40 mg p.o. once a day.  DISCHARGE CONDITION:  Improved.  PRINCIPAL PROCEDURES:  Pancreaticoduodenectomy, pancreatic stent on January 22, 2011.  PRINCIPAL LABORATORY DATA:  [name] did have a low hemoglobin of 7.4 secondary to bleeding and hemodilution, this came up to 11 prior to discharge with a 2-unit blood transfusion.  White count 5.9.  She did have some elevated blood sugars at first, which are now in the 100-120 range.  Creatinine has been normal and stayed normal at 0.9 prior to discharge.  She had some brief hypokalemia that resolved with as low as 3.4, now at 3.7, and her phosphorus was also low at 1.2 up to 3.2 prior to discharge.  HOSPITAL COURSE:  [name] was admitted to the ICU following her Whipple and had low urine output the first night.  She got a bolus of albumin, then her hematocrit continued to drop.  She did receive 2 units of packed cells for this.  She was kept on a beta-blocker for cardiac prophylaxis and for hypertension and kept on her Synthroid for hypothyroidism.  She did quite well other than being fairly weak.  She had no issues.  Her ileus resolved and she was advanced to a carbohydrate controlled  diet without difficulty.  She was able to void without the Foley.  She will be discharged to [facility] facility in improved condition.  She was going there because she has no one to help her at home and will need to be there until she gets back on her feet.    Stark Klein, MD    FB/MEDQ  D:  02/04/2011  T:  02/04/2011  Job:  [id number]  Electronically Signed by Stark Klein MD on 02/20/2011 12:21:07 PM

## 2011-03-03 LAB — TYPE AND SCREEN
ABO/RH(D): B POS
Antibody Screen: NEGATIVE

## 2011-03-03 LAB — CBC
HCT: 38.2 % (ref 36.0–46.0)
Hemoglobin: 13.1 g/dL (ref 12.0–15.0)
MCH: 31 pg (ref 26.0–34.0)
MCHC: 34.3 g/dL (ref 30.0–36.0)
MCV: 90.5 fL (ref 78.0–100.0)
Platelets: 274 10*3/uL (ref 150–400)
RBC: 4.22 MIL/uL (ref 3.87–5.11)
RDW: 14.4 % (ref 11.5–15.5)
WBC: 11.6 10*3/uL — ABNORMAL HIGH (ref 4.0–10.5)

## 2011-03-03 LAB — BASIC METABOLIC PANEL
BUN: 26 mg/dL — ABNORMAL HIGH (ref 6–23)
CO2: 24 mEq/L (ref 19–32)
Calcium: 9.7 mg/dL (ref 8.4–10.5)
Chloride: 102 mEq/L (ref 96–112)
Creatinine, Ser: 1.48 mg/dL — ABNORMAL HIGH (ref 0.4–1.2)
GFR calc Af Amer: 42 mL/min — ABNORMAL LOW (ref >60)
GFR calc non Af Amer: 35 mL/min — ABNORMAL LOW (ref >60)
Glucose, Bld: 103 mg/dL — ABNORMAL HIGH (ref 70–99)
Potassium: 3.4 mEq/L — ABNORMAL LOW (ref 3.5–5.1)
Sodium: 137 mEq/L (ref 135–145)

## 2011-03-03 LAB — SURGICAL PCR SCREEN
MRSA, PCR: NEGATIVE
Staphylococcus aureus: NEGATIVE

## 2011-03-03 LAB — ABO/RH: ABO/RH(D): B POS

## 2011-03-04 ENCOUNTER — Ambulatory Visit: Payer: Medicare Other | Admitting: Internal Medicine

## 2011-03-15 DIAGNOSIS — I10 Essential (primary) hypertension: Secondary | ICD-10-CM

## 2011-03-15 DIAGNOSIS — Z483 Aftercare following surgery for neoplasm: Secondary | ICD-10-CM

## 2011-03-18 ENCOUNTER — Ambulatory Visit (INDEPENDENT_AMBULATORY_CARE_PROVIDER_SITE_OTHER): Payer: Medicare Other | Admitting: Internal Medicine

## 2011-03-18 DIAGNOSIS — D136 Benign neoplasm of pancreas: Secondary | ICD-10-CM

## 2011-03-18 DIAGNOSIS — R6889 Other general symptoms and signs: Secondary | ICD-10-CM

## 2011-03-18 DIAGNOSIS — R7301 Impaired fasting glucose: Secondary | ICD-10-CM

## 2011-03-18 DIAGNOSIS — I10 Essential (primary) hypertension: Secondary | ICD-10-CM

## 2011-04-02 ENCOUNTER — Ambulatory Visit (INDEPENDENT_AMBULATORY_CARE_PROVIDER_SITE_OTHER): Payer: Medicare Other | Admitting: Internal Medicine

## 2011-04-02 DIAGNOSIS — I10 Essential (primary) hypertension: Secondary | ICD-10-CM

## 2011-04-15 ENCOUNTER — Ambulatory Visit (INDEPENDENT_AMBULATORY_CARE_PROVIDER_SITE_OTHER): Payer: Medicare Other | Admitting: Internal Medicine

## 2011-04-15 DIAGNOSIS — E039 Hypothyroidism, unspecified: Secondary | ICD-10-CM

## 2011-04-15 DIAGNOSIS — I10 Essential (primary) hypertension: Secondary | ICD-10-CM

## 2011-04-24 ENCOUNTER — Encounter (INDEPENDENT_AMBULATORY_CARE_PROVIDER_SITE_OTHER): Payer: Self-pay | Admitting: General Surgery

## 2011-04-30 NOTE — Op Note (Signed)
NAMESereen, [name] [name]                  ACCOUNT NO.:  [id number]   MEDICAL RECORD NO.:  [id number]          PATIENT TYPE:  INP   LOCATION:  2899                         FACILITY:  [facility]   PHYSICIAN:  Marchia Meiers. Vertell Limber, M.D.  DATE OF BIRTH:  [birth year]   DATE OF PROCEDURE:  11/27/2007  DATE OF DISCHARGE:                               OPERATIVE REPORT   PREOPERATIVE DIAGNOSIS:  Herniated cervical disk with  spondylosis,degenerative disk disease and radiculopathy, C5-6 and C6-7  levels.   POSTOPERATIVE DIAGNOSIS:  Herniated cervical disk with  spondylosis,degenerative disk disease and radiculopathy, C5-6 and C6-7  levels.   PROCEDURE:  Anterior cervical decompression and fusion, C5-6 and C6-7,  with allograft bone graft along with bone autograft and anterior  cervical plate.   SURGEON:  Marchia Meiers. Vertell Limber, M.D.   ASSISTANTS:  1. Verdis Prime, R.N.  2. Leeroy Cha, M.D.   ANESTHESIA:  General endotracheal anesthesia.   ESTIMATED BLOOD LOSS:  Minimal.   COMPLICATIONS:  None.   DISPOSITION:  To Recovery.   INDICATIONS:  [name] with bad left arm pain  and left arm weakness.  She has a large amount of spondylitic nerve root  compression, most marked at the C6-7 level on the left, but also to a  lesser degree at the C5-6 level on the left.  It was elected to take her  to surgery for anterior cervical decompression and fusion at the C5-6  and C6-7 levels.   PROCEDURE:  [name] was brought to the operating room.  Following the  satisfactory and uncomplicated induction of general endotracheal  anesthesia and placement of intravenous lines, the patient was placed in  supine position on the operating table.  Her neck was placed in neutral  alignment.  She was placed in 5 pounds of halter traction.  Her anterior  neck was then prepped and draped in the usual sterile fashion.  The area  of planned incision was infiltrated with 0.25% Marcaine and 0.5%  lidocaine with 1:200,000 epinephrine.  Incision was made from the  midline to the anterior border of the sternocleidomastoid muscle and  carried sharply through the platysma layer.  Subplatysmal dissection was  performed, exposing the anterior border of the sternocleidomastoid  muscle.  Using blunt dissection, the carotid sheath was kept lateral,  trachea and esophagus kept medial, exposing the anterior cervical spine.  A bent spinal needle was placed was felt at what was felt to be the C5-6  level and this was confirmed on intraoperative x-ray.  Subsequently, the  longus colli muscles were taken down from the anterior cervical spine  from C5 through C7 bilaterally using electrocautery and Key elevator.  A  self-retaining Shadowline retractor was placed along with up and down  retractor.  Ventral osteophytes were removed with a Leksell rongeur.  Bone was cleaned of investing soft tissue and saved for later use with  bone grafting.  Subsequently, the interspaces at C5-6 and C6-7 were then  incised with a 15 blade.  Disk material was removed in a piecemeal  fashion and endplates  were stripped of residual disk material.  Disk  distraction pins were placed initially at C6-C7 and using gentle  distraction, the interspace was opened.  Endplates were decorticated  with a high-speed drill and uncinate spurs were drilled down with a  smaller drill bit.  Bone was retained for later use with bone grafting.  Under loupe magnification, the endplates were stripped of residual disk  material.  The uncinate spurs were removed with a 2-mm gold-tipped  Kerrison rongeur, as was the posterior longitudinal ligament.  Both the  C7 nerve roots were widely decompressed as they extended out the neural  foramina.  Hemostasis was assured with Gelfoam soaked in thrombin and  after trial-sizing, a 7-mm allograft bone was selected, fashioned with a  high-speed drill, pack with morselized bone autograft and inserted  into  the interspace and countersunk appropriately.  Attention was then turned  to the C5-6 level, where a similar decompression was performed.  Endplates were decorticated with a high-speed drill and posterior  longitudinal ligament was removed along and the C6 nerve roots and  spinal cord dura were decompressed widely.  Hemostasis was again assured  and after trial-sizing, a similarly sized bone graft was fashioned,  packed with autograft and inserted in the interspace and countersunk  appropriately.  A 30-mm Trestle anterior cervical plate was then affixed  to the anterior cervical spine after removing the traction weight.  The  14-mm variable-angle screws were placed, 2 at C5, two at C6 and 2 at C7;  all screws had excellent purchase.  Locking mechanisms were engaged.  Final x-ray demonstrated well-positioned upper aspect of the construct  and correct levels.  Hemostasis was assured.  Soft tissues were  inspected and found to be in good repair.  The platysma layer was closed  with 3-0 Vicryl sutures, subcutaneous tissues were approximated with 3-0  Vicryl subcuticular stitch and wound was dressed with Dermabond.  The  patient was extubated in the operating room and taken to Recovery,  having tolerated the procedure well.      Marchia Meiers. Vertell Limber, M.D.  Electronically Signed     JDS/MEDQ  D:  11/27/2007  T:  11/29/2007  Job:  [id number]

## 2011-05-27 ENCOUNTER — Telehealth: Payer: Self-pay | Admitting: Internal Medicine

## 2011-05-28 ENCOUNTER — Telehealth: Payer: Self-pay | Admitting: Internal Medicine

## 2011-05-28 ENCOUNTER — Ambulatory Visit: Payer: Medicare Other | Admitting: Internal Medicine

## 2011-05-28 NOTE — Telephone Encounter (Signed)
Cortisone injections are OK now. I would hold off on hip replacement surgery for a few months

## 2011-05-28 NOTE — Telephone Encounter (Signed)
Advised pt too soon to have hip surgery.  See MJB in about 4 wks.

## 2011-06-11 ENCOUNTER — Other Ambulatory Visit: Payer: Self-pay | Admitting: Internal Medicine

## 2011-08-09 ENCOUNTER — Encounter: Payer: Self-pay | Admitting: Internal Medicine

## 2011-08-15 ENCOUNTER — Other Ambulatory Visit: Payer: Medicare Other | Admitting: Internal Medicine

## 2011-08-15 DIAGNOSIS — E785 Hyperlipidemia, unspecified: Secondary | ICD-10-CM

## 2011-08-15 DIAGNOSIS — E119 Type 2 diabetes mellitus without complications: Secondary | ICD-10-CM

## 2011-08-15 LAB — CBC WITH DIFFERENTIAL/PLATELET
Basophils Absolute: 0 10*3/uL (ref 0.0–0.1)
Basophils Relative: 0 % (ref 0–1)
Eosinophils Absolute: 0.1 10*3/uL (ref 0.0–0.7)
Eosinophils Relative: 1 % (ref 0–5)
HCT: 39.6 % (ref 36.0–46.0)
Hemoglobin: 12.7 g/dL (ref 12.0–15.0)
Lymphocytes Relative: 15 % (ref 12–46)
Lymphs Abs: 1.1 10*3/uL (ref 0.7–4.0)
MCH: 29.5 pg (ref 26.0–34.0)
MCHC: 32.1 g/dL (ref 30.0–36.0)
MCV: 92.1 fL (ref 78.0–100.0)
Monocytes Absolute: 0.7 10*3/uL (ref 0.1–1.0)
Monocytes Relative: 9 % (ref 3–12)
Neutro Abs: 5.6 10*3/uL (ref 1.7–7.7)
Neutrophils Relative %: 75 % (ref 43–77)
Platelets: 170 10*3/uL (ref 150–400)
RBC: 4.3 MIL/uL (ref 3.87–5.11)
RDW: 13.8 % (ref 11.5–15.5)
WBC: 7.4 10*3/uL (ref 4.0–10.5)

## 2011-08-15 LAB — COMPREHENSIVE METABOLIC PANEL
ALT: 30 U/L (ref 0–35)
AST: 21 U/L (ref 0–37)
Albumin: 4.1 g/dL (ref 3.5–5.2)
Alkaline Phosphatase: 79 U/L (ref 39–117)
BUN: 17 mg/dL (ref 6–23)
CO2: 27 mEq/L (ref 19–32)
Calcium: 10 mg/dL (ref 8.4–10.5)
Chloride: 107 mEq/L (ref 96–112)
Creat: 0.88 mg/dL (ref 0.50–1.10)
Glucose, Bld: 96 mg/dL (ref 70–99)
Potassium: 4.6 mEq/L (ref 3.5–5.3)
Sodium: 144 mEq/L (ref 135–145)
Total Bilirubin: 0.7 mg/dL (ref 0.3–1.2)
Total Protein: 6.1 g/dL (ref 6.0–8.3)

## 2011-08-15 LAB — LIPID PANEL
Cholesterol: 183 mg/dL (ref 0–200)
HDL: 71 mg/dL (ref >39)
LDL Cholesterol: 91 mg/dL (ref 0–99)
Total CHOL/HDL Ratio: 2.6 Ratio
Triglycerides: 103 mg/dL (ref <150)
VLDL: 21 mg/dL (ref 0–40)

## 2011-08-15 LAB — TSH: TSH: 1.141 u[IU]/mL (ref 0.350–4.500)

## 2011-08-15 LAB — HEMOGLOBIN A1C
Hgb A1c MFr Bld: 5.4 % (ref <5.7)
Mean Plasma Glucose: 108 mg/dL (ref <117)

## 2011-08-16 ENCOUNTER — Ambulatory Visit (INDEPENDENT_AMBULATORY_CARE_PROVIDER_SITE_OTHER): Payer: Medicare Other | Admitting: Internal Medicine

## 2011-08-16 ENCOUNTER — Encounter: Payer: Self-pay | Admitting: Internal Medicine

## 2011-08-16 DIAGNOSIS — I10 Essential (primary) hypertension: Secondary | ICD-10-CM

## 2011-08-16 DIAGNOSIS — D136 Benign neoplasm of pancreas: Secondary | ICD-10-CM

## 2011-08-16 DIAGNOSIS — M1990 Unspecified osteoarthritis, unspecified site: Secondary | ICD-10-CM

## 2011-08-17 ENCOUNTER — Encounter: Payer: Self-pay | Admitting: Internal Medicine

## 2011-08-17 DIAGNOSIS — M1990 Unspecified osteoarthritis, unspecified site: Secondary | ICD-10-CM | POA: Insufficient documentation

## 2011-08-17 DIAGNOSIS — D136 Benign neoplasm of pancreas: Secondary | ICD-10-CM | POA: Insufficient documentation

## 2011-08-17 DIAGNOSIS — I10 Essential (primary) hypertension: Secondary | ICD-10-CM | POA: Insufficient documentation

## 2011-08-17 NOTE — Progress Notes (Signed)
  Subjective:    Patient ID: [name], female    DOB: 1941-02-27, 70 y.o.   MRN: [id number]  HPI  and 70 year old white female with history of hypertension hyperlipidemia osteoarthritis of her hips had mellitus polyps renal insufficiency right thyroid nodule remote history of mitral valve prolapse in today for recheck visit. She had a pancreaticoduodenectomy and pancreatic stent placed February 2012. She had a Whipple procedure. Has taken a long time to get her strength back. She is now on Pancrease which helped a lot of her postprandial discomfort. Whipple was required because of a serous microcystic adenoma of the pancreas. She lost a tremendous amount of weight. 2008 she weighed 185 pounds but by January 2012 had lost to 132 pounds.  She ambulates with a walker. Has severe osteoarthritis of hips and knees and is looking at joint replacement sometime after the first of the year. Dr. Natale Milch with her orthopedist. Patient has had a recent lab work including CBC CVAT TSH hemoglobin A1c and fasting lipid panel all of which is reviewed with her today and is entirely within normal limits. Patient had colonoscopy 2006 Pneumovax vaccine 2006 tetanus immunization 2003. Gets annual influenza immunization. Last mammogram may 2011. Also had lumbar fusion L4-L5 June 2011. Had anterior cervical decompression C5-C6 C6-C7 with fusion 2008.    Review of Systems     Objective:   Physical Exam chest clear to auscultation; cardiac exam regular rate and rhythm normal S1 and [id number] click heard; extremities without edema. She appears frail and thin but is doing remarkably well        Assessment & Plan:  Hypertension  Hyperlipidemia  Osteoarthritis hips and knees  History of adenomatous polyps  Status post Whipple procedure for benign tumor  Patient is to return February 2013 for reevaluation with anticipation of joint replacement surgery February or March 2013

## 2011-09-23 LAB — COMPREHENSIVE METABOLIC PANEL
ALT: 20
AST: 20
Albumin: 3.9
Alkaline Phosphatase: 51
BUN: 27 — ABNORMAL HIGH
CO2: 25
Calcium: 9.5
Chloride: 108
Creatinine, Ser: 1.56 — ABNORMAL HIGH
GFR calc Af Amer: 40 — ABNORMAL LOW
GFR calc non Af Amer: 33 — ABNORMAL LOW
Glucose, Bld: 91
Potassium: 4.2
Sodium: 140
Total Bilirubin: 0.7
Total Protein: 6.2

## 2011-09-23 LAB — CBC
HCT: 35.5 — ABNORMAL LOW
Hemoglobin: 12.2
MCHC: 34.4
MCV: 90
Platelets: 255
RBC: 3.95
RDW: 12.8
WBC: 8.3

## 2011-10-02 ENCOUNTER — Other Ambulatory Visit: Payer: Self-pay | Admitting: Internal Medicine

## 2011-10-16 ENCOUNTER — Encounter: Payer: Self-pay | Admitting: Internal Medicine

## 2011-10-17 ENCOUNTER — Encounter: Payer: Self-pay | Admitting: Internal Medicine

## 2011-11-21 ENCOUNTER — Ambulatory Visit (INDEPENDENT_AMBULATORY_CARE_PROVIDER_SITE_OTHER): Payer: Medicare Other | Admitting: Internal Medicine

## 2011-11-21 DIAGNOSIS — Z23 Encounter for immunization: Secondary | ICD-10-CM

## 2011-12-04 ENCOUNTER — Other Ambulatory Visit: Payer: Self-pay | Admitting: Internal Medicine

## 2011-12-23 DIAGNOSIS — M169 Osteoarthritis of hip, unspecified: Secondary | ICD-10-CM | POA: Diagnosis not present

## 2011-12-23 NOTE — Progress Notes (Signed)
H&P performed 12/23/2011 Dictation # [id number]

## 2011-12-24 NOTE — H&P (Signed)
NAMESherby, [name]                  ACCOUNT NO.:  [id number]  MEDICAL RECORD NO.:  [id number]  LOCATION:  PERIO                        FACILITY:  [facility]  PHYSICIAN:  Gaynelle Arabian, M.D.    DATE OF BIRTH:  01-28-1941  DATE OF ADMISSION:  01/15/2012 DATE OF DISCHARGE:                             HISTORY & PHYSICAL   ADMITTING DIAGNOSIS:  End-stage osteoarthritis, right hip.  HISTORY OF PRESENT ILLNESS:  This is a 71 year old lady with a history of end-stage osteoarthritis of her right hip that has failed conservative treatment.  After discussion of treatments, benefits, risks, and options, the patient is now scheduled for total hip arthroplasty of the right hip.  Note that her medical doctor is Dr. Emeline General and her specialty physician surgeon is Dr. Stark Klein. She plans on going home after the surgery as long as she is doing well.  PAST MEDICAL HISTORY:  Drug allergy to CODEINE with headache, but she can take hydrocodone and oxycodone without difficulty.  She is not latex allergic.  CURRENT MEDICATIONS: 1. Amlodipine 10 mg a day. 2. Allopurinol 100 mg a day. 3. Atenolol 50 mg a day. 4. Sertraline 50 mg a day. 5. Synthroid 112 mcg a day. 6. Zenpep DR 10,000 units capsule 1-4 a day with meals and snacks.  SERIOUS MEDICAL ILLNESSES: 1. Hypertension. 2. Gout. 3. Depression. 4. Hypothyroidism. 5. Also history of valvular heart disease. 6. Hypercholesterolemia, but no medications for that currently.  PREVIOUS SURGERIES:  Cervical fusion, lumbar laminectomy, and abdominal surgery to remove a mass from stomach and pancreas which was noncancerous.  FAMILY HISTORY:  Positive for heart attack and dementia.  SOCIAL HISTORY:  The patient is widowed.  She is retired.  She does not smoke or drink.  She plans on going home after surgery.  She lives with her niece.  REVIEW OF SYSTEMS:  CENTRAL NERVOUS SYSTEM:  Negative for headache, blurred vision, or dizziness.  PULMONARY:   Negative for shortness of breath, PND, and orthopnea.  CARDIOVASCULAR:  Positive for hypertension and elevated cholesterol.  GI:  Positive for history of mass on the pancreas and stomach, noncancerous.  No sequelae from that.  Negative for ulcers or hepatitis.  GU:  Negative for urinary tract difficulty. MUSCULOSKELETAL:  Positive in HPI.  PHYSICAL EXAMINATION:  VITAL SIGNS:  Blood pressure 160/84, pulse 62 and regular, respirations 16. HEENT:  Head, normocephalic.  Nose patent.  Ears patent.  Pupils equal, round, and reactive to light.  Throat without injection. NECK:  Supple without adenopathy.  Carotids 2+ without bruit. CHEST:  Clear to auscultation.  No rales or rhonchi.  Respirations 16. HEART:  Regular rate and rhythm at 62 beats per minute without murmur. ABDOMEN:  Soft.  Active bowel sounds.  No masses, organomegaly. NEUROLOGIC:  Patient alert and oriented to time, place, and person. Cranial nerves II through XII grossly intact. EXTREMITIES:  Right hip with markedly diminished range of motion with 5 degrees from full extension, further flexion to 85 degrees, external rotation of 25 degrees, internal rotation of -5.  Sensation and circulation are intact.  PLAN:  Total hip arthroplasty, right hip.     Judith Part. Shell Yandow,  P.A.   ______________________________ Gaynelle Arabian, M.D.    SJC/MEDQ  D:  12/23/2011  T:  12/24/2011  Job:  [id number]

## 2011-12-31 ENCOUNTER — Other Ambulatory Visit: Payer: Self-pay | Admitting: Internal Medicine

## 2012-01-01 ENCOUNTER — Encounter (HOSPITAL_COMMUNITY): Payer: Self-pay | Admitting: Pharmacy Technician

## 2012-01-02 ENCOUNTER — Ambulatory Visit: Payer: Medicare Other | Admitting: Internal Medicine

## 2012-01-06 ENCOUNTER — Ambulatory Visit (INDEPENDENT_AMBULATORY_CARE_PROVIDER_SITE_OTHER): Payer: Medicare Other | Admitting: Internal Medicine

## 2012-01-06 ENCOUNTER — Encounter: Payer: Self-pay | Admitting: Internal Medicine

## 2012-01-06 DIAGNOSIS — Z9889 Other specified postprocedural states: Secondary | ICD-10-CM

## 2012-01-06 DIAGNOSIS — Z8639 Personal history of other endocrine, nutritional and metabolic disease: Secondary | ICD-10-CM | POA: Diagnosis not present

## 2012-01-06 DIAGNOSIS — Z8679 Personal history of other diseases of the circulatory system: Secondary | ICD-10-CM | POA: Diagnosis not present

## 2012-01-06 DIAGNOSIS — M1990 Unspecified osteoarthritis, unspecified site: Secondary | ICD-10-CM | POA: Diagnosis not present

## 2012-01-06 DIAGNOSIS — D136 Benign neoplasm of pancreas: Secondary | ICD-10-CM

## 2012-01-06 DIAGNOSIS — I10 Essential (primary) hypertension: Secondary | ICD-10-CM | POA: Diagnosis not present

## 2012-01-06 DIAGNOSIS — F32A Depression, unspecified: Secondary | ICD-10-CM

## 2012-01-06 DIAGNOSIS — Z862 Personal history of diseases of the blood and blood-forming organs and certain disorders involving the immune mechanism: Secondary | ICD-10-CM | POA: Diagnosis not present

## 2012-01-06 DIAGNOSIS — F329 Major depressive disorder, single episode, unspecified: Secondary | ICD-10-CM

## 2012-01-06 NOTE — Patient Instructions (Signed)
Continue same medications. Return in 6 months for physical examination.

## 2012-01-06 NOTE — Progress Notes (Signed)
  Subjective:    Patient ID: [name], female    DOB: 1941-07-05, 71 y.o.   MRN: [id number]  HPI 71 year old white female to have right hip replacement soon. Patient ambulates with a walker. Long-standing history of osteoarthritis of the hips. Says plans to have left hip replacement some 6 or 8 months from now. Currently her niece is attending [employer] and living with her which is helpful. History of hypertension, adenomatous colon polyps, renal insufficiency. History of hyperlipidemia but that seemed to resolve with weight loss status post pancreatic surgery for microcystic adenoma. She underwent pancreaticoduodenectomy with cholecystectomy February 2012. History of lumbar fusion L4-L5 June 2011. History of anterior cervical decompression/fusion C5-C6 and C6-C7 December 2008. Has been slow to recover from pancreatic surgery. Ambulates with a walker because of hip issues. Feels more steady with a walker. Had been 185 pounds August 2010 and lost down to 118 pounds April 2012 2 months after her surgery. Able to equal foods nail with less intolerance. Has gained 7 a half pounds since April 2012. History of gout. Maintained on allopurinol. History of mild depression maintained on Zoloft 50 mg daily. History of thyroid replacement because of history of thyroid nodule. She's been on this for many years even prior to my seeing her as a new patient in 45. History of mitral valve prolapse diagnosed by Dr. Mcarthur Rossetti and has been on Tenormin for many years.    Review of Systems     Objective:   Physical Exam neck no carotid bruits; no thyromegaly; chest clear to auscultation; cardiac exam regular rate and rhythm normal S1 and S2 without clicks or murmurs; extremities without edema        Assessment & Plan:  Osteoarthritis both hips to have right hip replacement in the near future.  History of lumbar disc disease  History of cervical disc disease  History of microcystic pancreatic adenoma resected via  Whipple procedure with cholecystectomy February 2012.  History of hypertension  History of hyperlipidemia prior to extensive weight loss with pancreatic surgery  History of renal insufficiency  History of gout  History of depression  Plan: Recommend EKG prior to surgery along with preop labs. Patient will return in 6 months for physical examination and fasting lab work.

## 2012-01-07 ENCOUNTER — Ambulatory Visit (INDEPENDENT_AMBULATORY_CARE_PROVIDER_SITE_OTHER): Payer: Medicare Other | Admitting: General Surgery

## 2012-01-07 ENCOUNTER — Encounter (INDEPENDENT_AMBULATORY_CARE_PROVIDER_SITE_OTHER): Payer: Self-pay | Admitting: General Surgery

## 2012-01-07 DIAGNOSIS — D136 Benign neoplasm of pancreas: Secondary | ICD-10-CM

## 2012-01-07 DIAGNOSIS — K8681 Exocrine pancreatic insufficiency: Secondary | ICD-10-CM | POA: Insufficient documentation

## 2012-01-07 DIAGNOSIS — K8689 Other specified diseases of pancreas: Secondary | ICD-10-CM | POA: Diagnosis not present

## 2012-01-07 NOTE — Assessment & Plan Note (Signed)
Taking pancreatic enzymes.    Titrated to 4-6 caps Creon per day. Usually one with meals unless she is eating something spicy or greasy.    Gassiness and loose stools are mostly gone.

## 2012-01-07 NOTE — Patient Instructions (Signed)
Continue pancreatic enzymes as you are doing  Good luck with hip replacement!!

## 2012-01-07 NOTE — Assessment & Plan Note (Signed)
Benign mass. No need for follow up scans.

## 2012-01-07 NOTE — Progress Notes (Signed)
HISTORY: Pt doing well.  Her pancreatic enzymes have helped significantly.  She is no longer having GI distress.  She is having a hip replacement next week.  She has no abdominal pain.  She does experience nausea if she tries to eat too much.      EXAM: Head: Normocephalic and atraumatic.  Eyes:  Conjunctivae are normal. Pupils are equal, round, and reactive to light. No scleral icterus.  Neck:  Normal range of motion. Neck supple. No tracheal deviation present. No thyromegaly present.  Resp: No respiratory distress, normal effort. Abd:  Abdomen is soft, non distended and non tender. No masses are palpable.  There is no rebound and no guarding.  Neurological: Alert and oriented to person, place, and time. Coordination normal.  Skin: Skin is warm and dry. No rash noted. No diaphoretic. No erythema. No pallor.  Psychiatric: Normal mood and affect. Normal behavior. Judgment and thought content normal.      ASSESSMENT AND PLAN:   Pancreatic adenoma Benign mass. No need for follow up scans.    Exocrine pancreatic insufficiency Taking pancreatic enzymes.    Titrated to 4-6 caps Creon per day. Usually one with meals unless she is eating something spicy or greasy.    Gassiness and loose stools are mostly gone.       Milus Height, MD Surgical Oncology, [facility]  Elby Showers, MD, MD Elby Showers, MD

## 2012-01-08 ENCOUNTER — Encounter (HOSPITAL_COMMUNITY): Payer: Self-pay

## 2012-01-08 ENCOUNTER — Other Ambulatory Visit: Payer: Self-pay

## 2012-01-08 ENCOUNTER — Encounter (HOSPITAL_COMMUNITY)
Admission: RE | Admit: 2012-01-08 | Discharge: 2012-01-08 | Disposition: A | Discharge status: Home / Self Care | Payer: Medicare Other | Source: Ambulatory Visit | Attending: Orthopedic Surgery | Admitting: Orthopedic Surgery

## 2012-01-08 ENCOUNTER — Ambulatory Visit (HOSPITAL_COMMUNITY)
Admission: RE | Admit: 2012-01-08 | Discharge: 2012-01-08 | Disposition: A | Discharge status: Home / Self Care | Payer: Medicare Other | Source: Ambulatory Visit | Attending: Orthopedic Surgery | Admitting: Orthopedic Surgery

## 2012-01-08 ENCOUNTER — Other Ambulatory Visit: Payer: Self-pay | Admitting: Internal Medicine

## 2012-01-08 DIAGNOSIS — Z01818 Encounter for other preprocedural examination: Secondary | ICD-10-CM | POA: Insufficient documentation

## 2012-01-08 DIAGNOSIS — I700 Atherosclerosis of aorta: Secondary | ICD-10-CM | POA: Diagnosis not present

## 2012-01-08 DIAGNOSIS — Z01811 Encounter for preprocedural respiratory examination: Secondary | ICD-10-CM | POA: Diagnosis not present

## 2012-01-08 DIAGNOSIS — Z01812 Encounter for preprocedural laboratory examination: Secondary | ICD-10-CM | POA: Insufficient documentation

## 2012-01-08 DIAGNOSIS — M169 Osteoarthritis of hip, unspecified: Secondary | ICD-10-CM | POA: Insufficient documentation

## 2012-01-08 DIAGNOSIS — Z01810 Encounter for preprocedural cardiovascular examination: Secondary | ICD-10-CM | POA: Diagnosis not present

## 2012-01-08 DIAGNOSIS — M1610 Unilateral primary osteoarthritis, unspecified hip: Secondary | ICD-10-CM | POA: Insufficient documentation

## 2012-01-08 LAB — COMPREHENSIVE METABOLIC PANEL
ALT: 14 U/L (ref 0–35)
AST: 18 U/L (ref 0–37)
Albumin: 4 g/dL (ref 3.5–5.2)
Alkaline Phosphatase: 83 U/L (ref 39–117)
BUN: 25 mg/dL — ABNORMAL HIGH (ref 6–23)
CO2: 23 mEq/L (ref 19–32)
Calcium: 10.3 mg/dL (ref 8.4–10.5)
Chloride: 108 mEq/L (ref 96–112)
Creatinine, Ser: 0.83 mg/dL (ref 0.50–1.10)
GFR calc Af Amer: 80 mL/min — ABNORMAL LOW (ref >90)
GFR calc non Af Amer: 69 mL/min — ABNORMAL LOW (ref >90)
Glucose, Bld: 88 mg/dL (ref 70–99)
Potassium: 3.4 mEq/L — ABNORMAL LOW (ref 3.5–5.1)
Sodium: 142 mEq/L (ref 135–145)
Total Bilirubin: 0.4 mg/dL (ref 0.3–1.2)
Total Protein: 6.8 g/dL (ref 6.0–8.3)

## 2012-01-08 LAB — CBC
HCT: 40.3 % (ref 36.0–46.0)
Hemoglobin: 13.7 g/dL (ref 12.0–15.0)
MCH: 29.5 pg (ref 26.0–34.0)
MCHC: 34 g/dL (ref 30.0–36.0)
MCV: 86.7 fL (ref 78.0–100.0)
Platelets: 165 10*3/uL (ref 150–400)
RBC: 4.65 MIL/uL (ref 3.87–5.11)
RDW: 13.1 % (ref 11.5–15.5)
WBC: 8.7 10*3/uL (ref 4.0–10.5)

## 2012-01-08 LAB — URINE MICROSCOPIC-ADD ON

## 2012-01-08 LAB — DIFFERENTIAL
Basophils Absolute: 0 10*3/uL (ref 0.0–0.1)
Basophils Relative: 0 % (ref 0–1)
Eosinophils Absolute: 0 10*3/uL (ref 0.0–0.7)
Eosinophils Relative: 0 % (ref 0–5)
Lymphocytes Relative: 17 % (ref 12–46)
Lymphs Abs: 1.4 10*3/uL (ref 0.7–4.0)
Monocytes Absolute: 0.6 10*3/uL (ref 0.1–1.0)
Monocytes Relative: 7 % (ref 3–12)
Neutro Abs: 6.6 10*3/uL (ref 1.7–7.7)
Neutrophils Relative %: 76 % (ref 43–77)

## 2012-01-08 LAB — SURGICAL PCR SCREEN
MRSA, PCR: NEGATIVE
Staphylococcus aureus: NEGATIVE

## 2012-01-08 LAB — URINALYSIS, ROUTINE W REFLEX MICROSCOPIC
Bilirubin Urine: NEGATIVE
Glucose, UA: NEGATIVE mg/dL
Hgb urine dipstick: NEGATIVE
Ketones, ur: NEGATIVE mg/dL
Nitrite: NEGATIVE
Protein, ur: NEGATIVE mg/dL
Specific Gravity, Urine: 1.014 (ref 1.005–1.030)
Urobilinogen, UA: 0.2 mg/dL (ref 0.0–1.0)
pH: 6 (ref 5.0–8.0)

## 2012-01-08 LAB — PROTIME-INR
INR: 0.98 (ref 0.00–1.49)
Prothrombin Time: 13.2 seconds (ref 11.6–15.2)

## 2012-01-08 LAB — APTT: aPTT: 30 seconds (ref 24–37)

## 2012-01-08 MED ORDER — ALLOPURINOL 100 MG PO TABS: 100.0000 mg | ORAL_TABLET | Freq: Every day | ORAL | Status: DC

## 2012-01-08 NOTE — Patient Instructions (Signed)
[place]  01/08/2012   Your procedure is scheduled on:  Wednesday 01/15/2012 at 130pm  Report to [facility] at 1130 AM.  Call this number if you have problems the morning of surgery: [phone]   Remember:   Do not eat food: after midnight.  May have clear liquids:up to 6 Hours before arrival.  Clear liquids include soda, tea, black coffee, apple or grape juice, broth.  Take these medicines the morning of surgery with A SIP OF WATER: Norvasc, Atenolol   Do not wear jewelry, make-up or nail polish.  Do not wear lotions, powders, or perfumes.   Do not shave 48 hours prior to surgery.(women only- shaving legs)  Do not bring valuables to the hospital.  Contacts, dentures or bridgework may not be worn into surgery.  Leave suitcase in the car. After surgery it may be brought to your room.  For patients admitted to the hospital, checkout time is 11:00 AM the day of discharge.   Patients discharged the day of surgery will not be allowed to drive home.  Name and phone number of your driver:   Special Instructions: CHG Shower Use Special Wash: 1/2 bottle night before surgery and 1/2 bottle morning of surgery.   Please read over the following fact sheets that you were given: MRSA Information

## 2012-01-15 ENCOUNTER — Inpatient Hospital Stay (HOSPITAL_COMMUNITY): Payer: Medicare Other

## 2012-01-15 ENCOUNTER — Encounter (HOSPITAL_COMMUNITY): Payer: Self-pay | Admitting: Anesthesiology

## 2012-01-15 ENCOUNTER — Encounter (HOSPITAL_COMMUNITY): Payer: Self-pay

## 2012-01-15 ENCOUNTER — Inpatient Hospital Stay (HOSPITAL_COMMUNITY): Payer: Medicare Other | Admitting: Anesthesiology

## 2012-01-15 ENCOUNTER — Encounter (HOSPITAL_COMMUNITY): Payer: Self-pay | Admitting: *Deleted

## 2012-01-15 ENCOUNTER — Encounter (HOSPITAL_COMMUNITY)
Admission: RE | Disposition: A | Discharge status: Skilled Nursing Facility | Payer: Self-pay | Source: Ambulatory Visit | Attending: Orthopedic Surgery

## 2012-01-15 ENCOUNTER — Inpatient Hospital Stay (HOSPITAL_COMMUNITY)
Admission: RE | Admit: 2012-01-15 | Discharge: 2012-01-20 | DRG: 470 | Disposition: A | Discharge status: Skilled Nursing Facility | Payer: Medicare Other | Source: Ambulatory Visit | Attending: Orthopedic Surgery | Admitting: Orthopedic Surgery

## 2012-01-15 DIAGNOSIS — D62 Acute posthemorrhagic anemia: Secondary | ICD-10-CM | POA: Diagnosis not present

## 2012-01-15 DIAGNOSIS — M1610 Unilateral primary osteoarthritis, unspecified hip: Secondary | ICD-10-CM | POA: Diagnosis not present

## 2012-01-15 DIAGNOSIS — E7800 Pure hypercholesterolemia, unspecified: Secondary | ICD-10-CM | POA: Diagnosis present

## 2012-01-15 DIAGNOSIS — D126 Benign neoplasm of colon, unspecified: Secondary | ICD-10-CM | POA: Diagnosis not present

## 2012-01-15 DIAGNOSIS — I10 Essential (primary) hypertension: Secondary | ICD-10-CM | POA: Diagnosis present

## 2012-01-15 DIAGNOSIS — Z9289 Personal history of other medical treatment: Secondary | ICD-10-CM

## 2012-01-15 DIAGNOSIS — I059 Rheumatic mitral valve disease, unspecified: Secondary | ICD-10-CM | POA: Diagnosis present

## 2012-01-15 DIAGNOSIS — E039 Hypothyroidism, unspecified: Secondary | ICD-10-CM | POA: Diagnosis present

## 2012-01-15 DIAGNOSIS — Z981 Arthrodesis status: Secondary | ICD-10-CM

## 2012-01-15 DIAGNOSIS — F3289 Other specified depressive episodes: Secondary | ICD-10-CM | POA: Diagnosis present

## 2012-01-15 DIAGNOSIS — F329 Major depressive disorder, single episode, unspecified: Secondary | ICD-10-CM | POA: Diagnosis present

## 2012-01-15 DIAGNOSIS — M25559 Pain in unspecified hip: Secondary | ICD-10-CM | POA: Diagnosis not present

## 2012-01-15 DIAGNOSIS — R031 Nonspecific low blood-pressure reading: Secondary | ICD-10-CM | POA: Diagnosis not present

## 2012-01-15 DIAGNOSIS — M169 Osteoarthritis of hip, unspecified: Secondary | ICD-10-CM | POA: Diagnosis present

## 2012-01-15 DIAGNOSIS — Z96649 Presence of unspecified artificial hip joint: Secondary | ICD-10-CM | POA: Diagnosis not present

## 2012-01-15 DIAGNOSIS — Z471 Aftercare following joint replacement surgery: Secondary | ICD-10-CM | POA: Diagnosis not present

## 2012-01-15 DIAGNOSIS — Z79899 Other long term (current) drug therapy: Secondary | ICD-10-CM

## 2012-01-15 DIAGNOSIS — S79919A Unspecified injury of unspecified hip, initial encounter: Secondary | ICD-10-CM | POA: Diagnosis not present

## 2012-01-15 DIAGNOSIS — D649 Anemia, unspecified: Secondary | ICD-10-CM | POA: Diagnosis not present

## 2012-01-15 DIAGNOSIS — Z5189 Encounter for other specified aftercare: Secondary | ICD-10-CM | POA: Diagnosis not present

## 2012-01-15 DIAGNOSIS — M109 Gout, unspecified: Secondary | ICD-10-CM | POA: Diagnosis present

## 2012-01-15 DIAGNOSIS — Z86010 Personal history of colonic polyps: Secondary | ICD-10-CM | POA: Diagnosis not present

## 2012-01-15 DIAGNOSIS — M1990 Unspecified osteoarthritis, unspecified site: Secondary | ICD-10-CM | POA: Diagnosis not present

## 2012-01-15 DIAGNOSIS — D136 Benign neoplasm of pancreas: Secondary | ICD-10-CM | POA: Diagnosis not present

## 2012-01-15 HISTORY — PX: TOTAL HIP ARTHROPLASTY: SHX124

## 2012-01-15 SURGERY — ARTHROPLASTY, HIP, TOTAL,POSTERIOR APPROACH
Anesthesia: General | Site: Hip | Laterality: Right | Wound class: Clean

## 2012-01-15 MED ORDER — SERTRALINE HCL 50 MG PO TABS
50.0000 mg | ORAL_TABLET | Freq: Every day | ORAL | Status: DC
  Administered 2012-01-15 – 2012-01-20 (×6): 50 mg via ORAL
  Filled 2012-01-15 (×6): qty 1

## 2012-01-15 MED ORDER — METOCLOPRAMIDE HCL 5 MG/ML IJ SOLN: 5.0000 mg | Freq: Three times a day (TID) | INTRAMUSCULAR | Status: DC | PRN

## 2012-01-15 MED ORDER — LIDOCAINE HCL (CARDIAC) 20 MG/ML IV SOLN
INTRAVENOUS | Status: DC | PRN
  Administered 2012-01-15 (×2): 40 mg via INTRAVENOUS

## 2012-01-15 MED ORDER — FENTANYL CITRATE 0.05 MG/ML IJ SOLN
INTRAMUSCULAR | Status: DC | PRN
  Administered 2012-01-15 (×7): 50 ug via INTRAVENOUS

## 2012-01-15 MED ORDER — POTASSIUM CHLORIDE IN NACL 20-0.45 MEQ/L-% IV SOLN
INTRAVENOUS | Status: DC
  Administered 2012-01-15: 18:00:00 via INTRAVENOUS
  Filled 2012-01-15 (×4): qty 1000

## 2012-01-15 MED ORDER — ACETAMINOPHEN 10 MG/ML IV SOLN
1000.0000 mg | Freq: Four times a day (QID) | INTRAVENOUS | Status: AC
  Administered 2012-01-15 – 2012-01-16 (×4): 1000 mg via INTRAVENOUS
  Filled 2012-01-15 (×6): qty 100

## 2012-01-15 MED ORDER — PROMETHAZINE HCL 25 MG/ML IJ SOLN: 6.2500 mg | INTRAMUSCULAR | Status: DC | PRN

## 2012-01-15 MED ORDER — ONDANSETRON HCL 4 MG/2ML IJ SOLN
INTRAMUSCULAR | Status: DC | PRN
  Administered 2012-01-15: 4 mg via INTRAVENOUS

## 2012-01-15 MED ORDER — PANCRELIPASE (LIP-PROT-AMYL) 12000-38000 UNITS PO CPEP
1.0000 | ORAL_CAPSULE | Freq: Three times a day (TID) | ORAL | Status: DC
  Administered 2012-01-16 – 2012-01-20 (×14): 1 via ORAL
  Filled 2012-01-15 (×15): qty 1

## 2012-01-15 MED ORDER — CEFAZOLIN SODIUM 1-5 GM-% IV SOLN
1.0000 g | INTRAVENOUS | Status: AC
  Administered 2012-01-15: 1 g via INTRAVENOUS

## 2012-01-15 MED ORDER — METHOCARBAMOL 500 MG PO TABS
500.0000 mg | ORAL_TABLET | Freq: Four times a day (QID) | ORAL | Status: DC | PRN
  Administered 2012-01-16 – 2012-01-18 (×3): 500 mg via ORAL
  Filled 2012-01-15 (×3): qty 1

## 2012-01-15 MED ORDER — HYDROMORPHONE HCL PF 1 MG/ML IJ SOLN
0.2500 mg | INTRAMUSCULAR | Status: DC | PRN
  Administered 2012-01-15 (×2): 0.5 mg via INTRAVENOUS

## 2012-01-15 MED ORDER — NEOSTIGMINE METHYLSULFATE 1 MG/ML IJ SOLN
INTRAMUSCULAR | Status: DC | PRN
  Administered 2012-01-15: 3 mg via INTRAVENOUS

## 2012-01-15 MED ORDER — DOCUSATE SODIUM 100 MG PO CAPS
100.0000 mg | ORAL_CAPSULE | Freq: Two times a day (BID) | ORAL | Status: DC
  Administered 2012-01-15 – 2012-01-20 (×10): 100 mg via ORAL
  Filled 2012-01-15 (×11): qty 1

## 2012-01-15 MED ORDER — MORPHINE SULFATE 2 MG/ML IJ SOLN: 1.0000 mg | INTRAMUSCULAR | Status: DC | PRN

## 2012-01-15 MED ORDER — RIVAROXABAN 10 MG PO TABS
10.0000 mg | ORAL_TABLET | Freq: Every day | ORAL | Status: DC
  Administered 2012-01-16 – 2012-01-20 (×5): 10 mg via ORAL
  Filled 2012-01-15 (×5): qty 1

## 2012-01-15 MED ORDER — GLYCOPYRROLATE 0.2 MG/ML IJ SOLN
INTRAMUSCULAR | Status: DC | PRN
  Administered 2012-01-15: .2 mg via INTRAVENOUS
  Administered 2012-01-15: .4 mg via INTRAVENOUS

## 2012-01-15 MED ORDER — BUPIVACAINE LIPOSOME 1.3 % IJ SUSP
20.0000 mL | Freq: Once | INTRAMUSCULAR | Status: AC
  Administered 2012-01-15: 20 mL
  Filled 2012-01-15: qty 20

## 2012-01-15 MED ORDER — LACTATED RINGERS IV SOLN
INTRAVENOUS | Status: DC
  Administered 2012-01-15: 1000 mL via INTRAVENOUS

## 2012-01-15 MED ORDER — METHOCARBAMOL 100 MG/ML IJ SOLN
500.0000 mg | Freq: Four times a day (QID) | INTRAMUSCULAR | Status: DC | PRN
  Administered 2012-01-15: 500 mg via INTRAVENOUS
  Filled 2012-01-15: qty 5

## 2012-01-15 MED ORDER — ACETAMINOPHEN 10 MG/ML IV SOLN
INTRAVENOUS | Status: DC | PRN
  Administered 2012-01-15: 1000 mg via INTRAVENOUS

## 2012-01-15 MED ORDER — LACTATED RINGERS IV SOLN
INTRAVENOUS | Status: DC | PRN
  Administered 2012-01-15: 13:00:00 via INTRAVENOUS

## 2012-01-15 MED ORDER — OXYCODONE HCL 5 MG PO TABS
5.0000 mg | ORAL_TABLET | ORAL | Status: DC | PRN
  Administered 2012-01-15 – 2012-01-17 (×3): 5 mg via ORAL
  Administered 2012-01-18 – 2012-01-19 (×2): 10 mg via ORAL
  Filled 2012-01-15: qty 1
  Filled 2012-01-15: qty 2
  Filled 2012-01-15 (×2): qty 1
  Filled 2012-01-15: qty 2

## 2012-01-15 MED ORDER — METOCLOPRAMIDE HCL 10 MG PO TABS: 5.0000 mg | ORAL_TABLET | Freq: Three times a day (TID) | ORAL | Status: DC | PRN

## 2012-01-15 MED ORDER — MENTHOL 3 MG MT LOZG
1.0000 | LOZENGE | OROMUCOSAL | Status: DC | PRN
  Filled 2012-01-15: qty 9

## 2012-01-15 MED ORDER — AMLODIPINE BESYLATE 10 MG PO TABS
10.0000 mg | ORAL_TABLET | Freq: Every day | ORAL | Status: DC
  Filled 2012-01-15: qty 1

## 2012-01-15 MED ORDER — POLYETHYLENE GLYCOL 3350 17 G PO PACK
17.0000 g | PACK | Freq: Every day | ORAL | Status: DC | PRN
  Administered 2012-01-19: 17 g via ORAL
  Filled 2012-01-15 (×2): qty 1

## 2012-01-15 MED ORDER — PROPOFOL 10 MG/ML IV BOLUS
INTRAVENOUS | Status: DC | PRN
  Administered 2012-01-15: 150 mg via INTRAVENOUS

## 2012-01-15 MED ORDER — ROCURONIUM BROMIDE 100 MG/10ML IV SOLN
INTRAVENOUS | Status: DC | PRN
  Administered 2012-01-15: 30 mg via INTRAVENOUS

## 2012-01-15 MED ORDER — ALLOPURINOL 100 MG PO TABS
100.0000 mg | ORAL_TABLET | Freq: Every day | ORAL | Status: DC
  Administered 2012-01-15 – 2012-01-20 (×6): 100 mg via ORAL
  Filled 2012-01-15 (×6): qty 1

## 2012-01-15 MED ORDER — ONDANSETRON HCL 4 MG/2ML IJ SOLN
4.0000 mg | Freq: Four times a day (QID) | INTRAMUSCULAR | Status: DC | PRN
  Administered 2012-01-16 (×2): 4 mg via INTRAVENOUS
  Filled 2012-01-15 (×2): qty 2

## 2012-01-15 MED ORDER — CHLORHEXIDINE GLUCONATE 4 % EX LIQD: 60.0000 mL | Freq: Once | CUTANEOUS | Status: DC

## 2012-01-15 MED ORDER — ALLOPURINOL 100 MG PO TABS: 100.0000 mg | ORAL_TABLET | Freq: Every day | ORAL | Status: DC

## 2012-01-15 MED ORDER — DIPHENHYDRAMINE HCL 12.5 MG/5ML PO ELIX: 12.5000 mg | ORAL_SOLUTION | ORAL | Status: DC | PRN

## 2012-01-15 MED ORDER — TEMAZEPAM 15 MG PO CAPS: 15.0000 mg | ORAL_CAPSULE | Freq: Every evening | ORAL | Status: DC | PRN

## 2012-01-15 MED ORDER — ONDANSETRON HCL 4 MG PO TABS: 4.0000 mg | ORAL_TABLET | Freq: Four times a day (QID) | ORAL | Status: DC | PRN

## 2012-01-15 MED ORDER — ACETAMINOPHEN 325 MG PO TABS: 650.0000 mg | ORAL_TABLET | Freq: Four times a day (QID) | ORAL | Status: DC | PRN

## 2012-01-15 MED ORDER — PHENOL 1.4 % MT LIQD
1.0000 | OROMUCOSAL | Status: DC | PRN
  Filled 2012-01-15: qty 177

## 2012-01-15 MED ORDER — CEFAZOLIN SODIUM 1-5 GM-% IV SOLN
1.0000 g | Freq: Four times a day (QID) | INTRAVENOUS | Status: AC
  Administered 2012-01-15 – 2012-01-16 (×3): 1 g via INTRAVENOUS
  Filled 2012-01-15 (×3): qty 50

## 2012-01-15 MED ORDER — FLEET ENEMA 7-19 GM/118ML RE ENEM: 1.0000 | ENEMA | Freq: Once | RECTAL | Status: AC | PRN

## 2012-01-15 MED ORDER — ATENOLOL 50 MG PO TABS
50.0000 mg | ORAL_TABLET | Freq: Every day | ORAL | Status: DC
  Filled 2012-01-15: qty 1

## 2012-01-15 MED ORDER — LEVOTHYROXINE SODIUM 112 MCG PO TABS
112.0000 ug | ORAL_TABLET | Freq: Every day | ORAL | Status: DC
  Administered 2012-01-16 – 2012-01-20 (×5): 112 ug via ORAL
  Filled 2012-01-15 (×5): qty 1

## 2012-01-15 MED ORDER — ACETAMINOPHEN 650 MG RE SUPP: 650.0000 mg | Freq: Four times a day (QID) | RECTAL | Status: DC | PRN

## 2012-01-15 MED ORDER — HYDROMORPHONE HCL PF 1 MG/ML IJ SOLN
INTRAMUSCULAR | Status: DC | PRN
  Administered 2012-01-15 (×2): 0.5 mg via INTRAVENOUS

## 2012-01-15 MED ORDER — BISACODYL 10 MG RE SUPP: 10.0000 mg | Freq: Every day | RECTAL | Status: DC | PRN

## 2012-01-15 MED ORDER — EPHEDRINE SULFATE 50 MG/ML IJ SOLN
INTRAMUSCULAR | Status: DC | PRN
  Administered 2012-01-15: 5 mg via INTRAVENOUS

## 2012-01-15 SURGICAL SUPPLY — 51 items
BAG SPEC THK2 15X12 ZIP CLS (MISCELLANEOUS) ×1
BAG ZIPLOCK 12X15 (MISCELLANEOUS) ×2 IMPLANT
BIT DRILL 2.8X128 (BIT) ×2 IMPLANT
BLADE EXTENDED COATED 6.5IN (ELECTRODE) ×2 IMPLANT
BLADE SAW SAG 73X25 THK (BLADE) ×1
BLADE SAW SGTL 73X25 THK (BLADE) ×1 IMPLANT
CLOSURE STERI STRIP 1/2 X4 (GAUZE/BANDAGES/DRESSINGS) ×1 IMPLANT
CLOTH BEACON ORANGE TIMEOUT ST (SAFETY) ×2 IMPLANT
DECANTER SPIKE VIAL GLASS SM (MISCELLANEOUS) ×2 IMPLANT
DRAPE INCISE IOBAN 66X45 STRL (DRAPES) ×2 IMPLANT
DRAPE ORTHO SPLIT 77X108 STRL (DRAPES) ×4
DRAPE POUCH INSTRU U-SHP 10X18 (DRAPES) ×2 IMPLANT
DRAPE SURG ORHT 6 SPLT 77X108 (DRAPES) ×2 IMPLANT
DRAPE U-SHAPE 47X51 STRL (DRAPES) ×2 IMPLANT
DRSG ADAPTIC 3X8 NADH LF (GAUZE/BANDAGES/DRESSINGS) ×2 IMPLANT
DRSG MEPILEX BORDER 4X4 (GAUZE/BANDAGES/DRESSINGS) ×2 IMPLANT
DRSG MEPILEX BORDER 4X8 (GAUZE/BANDAGES/DRESSINGS) ×2 IMPLANT
DURAPREP 26ML APPLICATOR (WOUND CARE) ×2 IMPLANT
ELECT REM PT RETURN 9FT ADLT (ELECTROSURGICAL) ×2
ELECTRODE REM PT RTRN 9FT ADLT (ELECTROSURGICAL) ×1 IMPLANT
EVACUATOR 1/8 PVC DRAIN (DRAIN) ×2 IMPLANT
FACESHIELD LNG OPTICON STERILE (SAFETY) ×8 IMPLANT
GLOVE BIO SURGEON STRL SZ7.5 (GLOVE) ×2 IMPLANT
GLOVE BIO SURGEON STRL SZ8 (GLOVE) ×2 IMPLANT
GLOVE BIOGEL PI IND STRL 8 (GLOVE) ×2 IMPLANT
GLOVE BIOGEL PI INDICATOR 8 (GLOVE) ×2
GOWN STRL NON-REIN LRG LVL3 (GOWN DISPOSABLE) ×2 IMPLANT
GOWN STRL REIN XL XLG (GOWN DISPOSABLE) ×2 IMPLANT
IMMOBILIZER KNEE 20 (SOFTGOODS)
IMMOBILIZER KNEE 20 THIGH 36 (SOFTGOODS) IMPLANT
KIT BASIN OR (CUSTOM PROCEDURE TRAY) ×2 IMPLANT
MANIFOLD NEPTUNE II (INSTRUMENTS) ×2 IMPLANT
NDL SAFETY ECLIPSE 18X1.5 (NEEDLE) ×1 IMPLANT
NEEDLE HYPO 18GX1.5 SHARP (NEEDLE) ×2
NS IRRIG 1000ML POUR BTL (IV SOLUTION) ×2 IMPLANT
PACK TOTAL JOINT (CUSTOM PROCEDURE TRAY) ×2 IMPLANT
PASSER SUT SWANSON 36MM LOOP (INSTRUMENTS) ×2 IMPLANT
POSITIONER SURGICAL ARM (MISCELLANEOUS) ×2 IMPLANT
SPONGE GAUZE 4X4 12PLY (GAUZE/BANDAGES/DRESSINGS) ×2 IMPLANT
STRIP CLOSURE SKIN 1/2X4 (GAUZE/BANDAGES/DRESSINGS) ×4 IMPLANT
SUT ETHIBOND NAB CT1 #1 30IN (SUTURE) ×4 IMPLANT
SUT MNCRL AB 4-0 PS2 18 (SUTURE) ×2 IMPLANT
SUT VIC AB 1 CT1 27 (SUTURE) ×6
SUT VIC AB 1 CT1 27XBRD ANTBC (SUTURE) ×3 IMPLANT
SUT VIC AB 2-0 CT1 27 (SUTURE) ×6
SUT VIC AB 2-0 CT1 TAPERPNT 27 (SUTURE) ×3 IMPLANT
SYR 50ML LL SCALE MARK (SYRINGE) ×2 IMPLANT
TOWEL OR 17X26 10 PK STRL BLUE (TOWEL DISPOSABLE) ×4 IMPLANT
TOWEL OR NON WOVEN STRL DISP B (DISPOSABLE) ×2 IMPLANT
TRAY FOLEY CATH 14FRSI W/METER (CATHETERS) ×2 IMPLANT
WATER STERILE IRR 1500ML POUR (IV SOLUTION) ×2 IMPLANT

## 2012-01-15 NOTE — Anesthesia Preprocedure Evaluation (Signed)
Anesthesia Evaluation  Patient identified by MRN, date of birth, ID band Patient awake    Reviewed: Allergy & Precautions, H&P , NPO status , Patient's Chart, lab work & pertinent test results  Airway Mallampati: II TM Distance: >3 FB Neck ROM: Full    Dental No notable dental hx.    Pulmonary neg pulmonary ROS,  clear to auscultation  Pulmonary exam normal       Cardiovascular hypertension, Pt. on medications and Pt. on home beta blockers Regular Normal    Neuro/Psych Negative Neurological ROS  Negative Psych ROS   GI/Hepatic negative GI ROS, Neg liver ROS,   Endo/Other  Negative Endocrine ROS  Renal/GU negative Renal ROS  Genitourinary negative   Musculoskeletal negative musculoskeletal ROS (+)   Abdominal   Peds negative pediatric ROS (+)  Hematology negative hematology ROS (+)   Anesthesia Other Findings   Reproductive/Obstetrics negative OB ROS                           Anesthesia Physical Anesthesia Plan  ASA: II  Anesthesia Plan: General   Post-op Pain Management:    Induction: Intravenous  Airway Management Planned:   Additional Equipment:   Intra-op Plan:   Post-operative Plan: Extubation in OR  Informed Consent: I have reviewed the patients History and Physical, chart, labs and discussed the procedure including the risks, benefits and alternatives for the proposed anesthesia with the patient or authorized representative who has indicated his/her understanding and acceptance.   Dental advisory given  Plan Discussed with: CRNA  Anesthesia Plan Comments:         Anesthesia Quick Evaluation

## 2012-01-15 NOTE — Anesthesia Postprocedure Evaluation (Signed)
  Anesthesia Post-op Note  Patient: [name]  Procedure(s) Performed:  TOTAL HIP ARTHROPLASTY  Patient Location: PACU  Anesthesia Type: General  Level of Consciousness: awake and alert   Airway and Oxygen Therapy: Patient Spontanous Breathing  Post-op Pain: mild  Post-op Assessment: Post-op Vital signs reviewed, Patient's Cardiovascular Status Stable, Respiratory Function Stable, Patent Airway and No signs of Nausea or vomiting  Post-op Vital Signs: stable  Complications: No apparent anesthesia complications

## 2012-01-15 NOTE — Progress Notes (Signed)
PORTABLE AP PELVIS AND AP RIGHT HIP X-RAYS DONE

## 2012-01-15 NOTE — Interval H&P Note (Signed)
History and Physical Interval Note:  01/15/2012 2:03 PM  [name]  has presented today for surgery, with the diagnosis of Osteoarthritis of the Right Hip  The various methods of treatment have been discussed with the patient and family. After consideration of risks, benefits and other options for treatment, the patient has consented to  Procedure(s): TOTAL HIP ARTHROPLASTY as a surgical intervention .  The patients' history has been reviewed, patient examined, no change in status, stable for surgery.  I have reviewed the patients' chart and labs.  Questions were answered to the patient's satisfaction.     Gearlean Alf

## 2012-01-15 NOTE — Progress Notes (Deleted)
Portable ap pelvis and ap right hip x-rays done

## 2012-01-15 NOTE — Progress Notes (Signed)
X-ray results- left hip prosthesis without acute abnormality

## 2012-01-15 NOTE — Progress Notes (Signed)
Dr. Delma Post in- made aware of patient's heart rates being in the 12s

## 2012-01-15 NOTE — Op Note (Signed)
Pre-operative diagnosis- Osteoarthritis Right hip  Post-operative diagnosis- Osteoarthritis  Right hip  Procedure-  RightTotal Hip Arthroplasty  Surgeon- Dione Plover. Rosela Supak, MD  Assistant- Arlee Muslim, PA-C   Anesthesia  General  EBL- 300   Drain Hemovac   Complication- None  Condition-PACU - hemodynamically stable.   Brief Clinical Note-  [name] is a 71 y.o. female with end stage arthritis of her right hip with progressively worsening pain and dysfunction. Pain occurs with activity and rest including pain at night. She has tried analgesics, protected weight bearing and rest without benefit. Pain is too severe to attempt physical therapy. Radiographs demonstrate bone on bone arthritis with subchondral cyst formation. She has severe erosion of her femoral head and superior acetabulum with massive osteophytes and near ankylosis of the hip joint. She presents now for right THA.  Procedure in detail-   The patient is brought into the operating room and placed on the operating table. After successful administration of General  anesthesia, the patient is placed in the  Left lateral decubitus position with the  Right side up and held in place with the hip positioner. The lower extremity is isolated from the perineum with plastic drapes and time-out is performed by the surgical team. The lower extremity is then prepped and draped in the usual sterile fashion. A short posterolateral incision is made with a ten blade through the subcutaneous tissue to the level of the fascia lata which is incised in line with the skin incision. The sciatic nerve is palpated and protected and the short external rotators and capsule are isolated from the femur. The hip is then dislocated and the center of the femoral head is marked. A trial prosthesis is placed such that the trial head corresponds to the center of the patients' native femoral head. The resection level is marked on the femoral neck and the resection  is made with an oscillating saw. The femoral head is removed and femoral retractors placed to gain access to the femoral canal.      The canal finder is passed into the femoral canal and the canal is thoroughly irrigated with sterile saline to remove the fatty contents. Axial reaming is performed to 13.5  mm, proximal reaming to 18D  and the sleeve machined to a small. A  18D small trial sleeve is placed into the proximal femur.      The femur is then retracted anteriorly to gain acetabular exposure. Acetabular retractors are placed and the labrum and osteophytes are removed, Acetabular reaming is performed to 53  mm and a 54  mm Pinnacle acetabular shell is placed in anatomic position with excellent purchase. A massive rim of osteophytes was removed from the rim of the acetabulum circumferentially.  Additional dome screws were placed. An apex hole eliminator is placed and the permanent 36 mm neutral + 4 Marathon liner is placed into the acetabular shell.      The trial femur is then placed into the femoral canal. The size is 18 x 13  stem with a 36 + 8  neck and a 36 + 0 head with the neck version matching the patients' native anteversion. The hip is reduced with excellent stability with full extension and full external rotation, 70 degrees flexion with 40 degrees adduction and 90 degrees internal rotation and 90 degrees of flexion with 70 degrees of internal rotation. The operative leg is placed on top of the non-operative leg and the leg lengths are found to be equal.  The trials are then removed and the permanent implant of the same size is impacted into the femoral canal. The ceramic femoral head of the same size as the trial is placed and the hip is reduced with the same stability parameters. The operative leg is again placed on top of the non-operative leg and the leg lengths are found to be equal.      The wound is then copiously irrigated with saline solution and the capsule and short external rotators  are re-attached to the femur through drill holes with Ethibond suture. The fascia lata is closed over a hemovac drain with #1 vicryl suture and the fascia lata, gluteal muscles and subcutaneous tissues are injected with Exparel 78ml diluted with saline 30ml. The subcutaneous tissues are closed with #1 and2-0 vicryl and the subcuticular layer closed with running 4-0 Monocryl. The drain is hooked to suction, incision cleaned and dried, and steri-srips and a bulky sterile dressing applied. The limb is placed into a knee immobilizer and the patient is awakened and transported to recovery in stable condition.      Please note that a surgical assistant was a medical necessity for this procedure in order to perform it in a safe and expeditious manner. The assistant was necessary to provide retraction to the vital neurovascular structures and to retract and position the limb to allow for anatomic placement of the prosthetic components.  Dione Plover Sierra Kisner, MD    01/15/2012, 3:43 PM

## 2012-01-15 NOTE — Preoperative (Signed)
Beta Blockers   Reason not to administer Beta Blockers:Not Applicable, pt not on home bb

## 2012-01-15 NOTE — Anesthesia Procedure Notes (Signed)
Procedure Name: Intubation Date/Time: 01/15/2012 2:34 PM Performed by: Heide Scales CATHERINE PAYNE Pre-anesthesia Checklist: Patient identified, Emergency Drugs available, Suction available and Patient being monitored Patient Re-evaluated:Patient Re-evaluated prior to inductionOxygen Delivery Method: Circle System Utilized Preoxygenation: Pre-oxygenation with 100% oxygen Intubation Type: IV induction Ventilation: Mask ventilation without difficulty Laryngoscope Size: Mac and 3 Grade View: Grade III Tube type: Oral Tube size: 7.5 mm Number of attempts: 1 Airway Equipment and Method: stylet Placement Confirmation: positive ETCO2,  CO2 detector and breath sounds checked- equal and bilateral Secured at: 21 cm Tube secured with: Tape Dental Injury: Teeth and Oropharynx as per pre-operative assessment

## 2012-01-15 NOTE — Transfer of Care (Signed)
Immediate Anesthesia Transfer of Care Note  Patient: [name]  Procedure(s) Performed:  TOTAL HIP ARTHROPLASTY  Patient Location: PACU  Anesthesia Type: General  Level of Consciousness: awake, alert , oriented and patient cooperative  Airway & Oxygen Therapy: Patient Spontanous Breathing and Patient connected to face mask oxygen  Post-op Assessment: Report given to PACU RN and Post -op Vital signs reviewed and stable  Post vital signs: Reviewed and stable  Complications: No apparent anesthesia complications

## 2012-01-16 ENCOUNTER — Encounter (HOSPITAL_COMMUNITY): Payer: Self-pay | Admitting: Orthopedic Surgery

## 2012-01-16 LAB — CBC
HCT: 26.1 % — ABNORMAL LOW (ref 36.0–46.0)
Hemoglobin: 8.9 g/dL — ABNORMAL LOW (ref 12.0–15.0)
MCH: 29.8 pg (ref 26.0–34.0)
MCHC: 34.1 g/dL (ref 30.0–36.0)
MCV: 87.3 fL (ref 78.0–100.0)
Platelets: 110 10*3/uL — ABNORMAL LOW (ref 150–400)
RBC: 2.99 MIL/uL — ABNORMAL LOW (ref 3.87–5.11)
RDW: 13.2 % (ref 11.5–15.5)
WBC: 7.7 10*3/uL (ref 4.0–10.5)

## 2012-01-16 LAB — BASIC METABOLIC PANEL
BUN: 19 mg/dL (ref 6–23)
CO2: 24 mEq/L (ref 19–32)
Calcium: 8.8 mg/dL (ref 8.4–10.5)
Chloride: 107 mEq/L (ref 96–112)
Creatinine, Ser: 0.79 mg/dL (ref 0.50–1.10)
GFR calc Af Amer: >90 mL/min (ref >90)
GFR calc non Af Amer: 82 mL/min — ABNORMAL LOW (ref >90)
Glucose, Bld: 115 mg/dL — ABNORMAL HIGH (ref 70–99)
Potassium: 4.3 mEq/L (ref 3.5–5.1)
Sodium: 135 mEq/L (ref 135–145)

## 2012-01-16 MED ORDER — AMLODIPINE BESYLATE 10 MG PO TABS
10.0000 mg | ORAL_TABLET | Freq: Every day | ORAL | Status: DC
  Administered 2012-01-19 – 2012-01-20 (×2): 10 mg via ORAL
  Filled 2012-01-16 (×4): qty 1

## 2012-01-16 MED ORDER — SODIUM CHLORIDE 0.9 % IV BOLUS (SEPSIS)
500.0000 mL | Freq: Once | INTRAVENOUS | Status: AC
  Administered 2012-01-16: 500 mL via INTRAVENOUS

## 2012-01-16 MED ORDER — ATENOLOL 50 MG PO TABS
50.0000 mg | ORAL_TABLET | Freq: Every day | ORAL | Status: DC
  Administered 2012-01-18 – 2012-01-20 (×3): 50 mg via ORAL
  Filled 2012-01-16 (×4): qty 1

## 2012-01-16 MED ORDER — POLYSACCHARIDE IRON 150 MG PO CAPS
150.0000 mg | ORAL_CAPSULE | Freq: Every day | ORAL | Status: DC
  Administered 2012-01-16 – 2012-01-20 (×5): 150 mg via ORAL
  Filled 2012-01-16 (×5): qty 1

## 2012-01-16 MED ORDER — SODIUM CHLORIDE 0.9 % IV BOLUS (SEPSIS)
250.0000 mL | Freq: Once | INTRAVENOUS | Status: AC
  Administered 2012-01-16: 250 mL via INTRAVENOUS

## 2012-01-16 MED FILL — Sodium Chloride Inj 0.9%: INTRAMUSCULAR | Qty: 50 | Status: AC

## 2012-01-16 NOTE — Progress Notes (Signed)
Physical Therapy Treatment Patient Details Name: [name] MRN: [id number] DOB: 10-25-1941 Today's Date: 01/16/2012 Time: [id number] Charge; TE, TA PT Assessment/Plan  PT - Assessment/Plan Comments on Treatment Session: Pt continues to have low BP so did not attempt ambulation this afternoon for pt safety.  Pt assist back to bed and performed exercises.  Pt only able to recall 1/3 hip precautions and verbalized PWB without cues. PT Plan: Discharge plan remains appropriate;Frequency remains appropriate PT Frequency: 7X/week Follow Up Recommendations: Home health PT;Supervision for mobility/OOB Equipment Recommended: None recommended by PT PT Goals  Acute Rehab PT Goals PT Goal Formulation: With patient Time For Goal Achievement: 7 days Pt will go Supine/Side to Sit: with modified independence PT Goal: Supine/Side to Sit - Progress: Goal set today Pt will go Sit to Supine/Side: with modified independence PT Goal: Sit to Supine/Side - Progress: Progressing toward goal Pt will go Sit to Stand: with modified independence PT Goal: Sit to Stand - Progress: Progressing toward goal Pt will go Stand to Sit: with modified independence PT Goal: Stand to Sit - Progress: Progressing toward goal Pt will Ambulate: >150 feet;with supervision;with rolling walker PT Goal: Ambulate - Progress: Goal set today Pt will Perform Home Exercise Program: with supervision, verbal cues required/provided PT Goal: Perform Home Exercise Program - Progress: Progressing toward goal  PT Treatment Precautions/Restrictions  Precautions Precautions: Posterior Hip Restrictions Weight Bearing Restrictions: Yes RLE Weight Bearing: Partial weight bearing RLE Partial Weight Bearing Percentage or Pounds: 25-50% Mobility (including Balance) Bed Mobility Bed Mobility: Yes Supine to Sit: 3: Mod assist;HOB elevated (Comment degrees) Supine to Sit Details (indicate cue type and reason): assist for R LE and trunk, verbal  cues for technique Sitting - Scoot to Edge of Bed: 4: Min assist Sitting - Scoot to [employer] of Bed Details (indicate cue type and reason): assist with bed pad to help R hip to EOB Sit to Supine: 4: Min assist Sit to Supine - Details (indicate cue type and reason): assist for R LE, verbal cues for technique Transfers Transfers: Yes Sit to Stand: 4: Min assist;With upper extremity assist;From chair/3-in-1;With armrests Sit to Stand Details (indicate cue type and reason): min/guard, verbal cue for R LE forward and hand placement, vc for straightening R LE Stand to Sit: With upper extremity assist;4: Min assist;To bed Stand to Sit Details:   min/guard, verbal cue for R LE forward and hand placement  Stand Pivot Transfers: 4: Min assist Stand Pivot Transfer Details (indicate cue type and reason): min/guard, verbal cue for turning keeping precautions Ambulation/Gait Ambulation/Gait: No (deferred again 2* low BP (86/40))    Exercise  Total Joint Exercises Ankle Circles/Pumps: AROM;Both;20 reps;Supine Quad Sets: AROM;Strengthening;Both;20 reps;Supine Gluteal Sets: AROM;Strengthening;Both;Other reps (comment);Supine (20 reps) Short Arc Quad: AAROM;Strengthening;Right;Other reps (comment);Supine (20 reps) Heel Slides: AAROM;Strengthening;Right;20 reps;Supine (within precautions) Hip ABduction/ADduction: AAROM;Strengthening;Right;20 reps;Supine End of Session PT - End of Session Equipment Utilized During Treatment: Gait belt Activity Tolerance: Patient tolerated treatment well Patient left: in bed;with call bell in reach;with family/visitor present Nurse Communication: Other (comment) (notified of BP) General Behavior During Session: [facility] for tasks performed Cognition: [facility] for tasks performed  Lashae Wollenberg,KATHrine E 01/16/2012, 3:09 PM Pager: [phone]

## 2012-01-16 NOTE — Progress Notes (Signed)
Utilization review completed.  

## 2012-01-16 NOTE — Evaluation (Signed)
Physical Therapy Evaluation Patient Details Name: [name] MRN: [id number] DOB: [birth year] Today's Date: 01/16/2012 Time: [id number] Charge: EVII  Problem List:  Patient Active Problem List  Diagnoses  . NONSPECIFIC ABN FINDING RAD & OTH EXAM GI TRACT  . Hyperlipidemia  . Hypertension  . Osteoarthritis  . Pancreatic adenoma  . Exocrine pancreatic insufficiency  . Osteoarthritis of hip    Past Medical History:  Past Medical History  Diagnosis Date  . Hypertension   . Hyperlipidemia   . Thyroid disease   . Colon polyp   . Renal insufficiency   . Arthritis   . MVP (mitral valve prolapse)    Past Surgical History:  Past Surgical History  Procedure Date  . Back surgery 2011    LOWER BACK FUSION L4-5  . Neck surgery 2009    NECK FUSION C5-6; C6-7  . Cholecystectomy   . Microcystic adenoma 02/12    pancreas    PT Assessment/Plan/Recommendation PT Assessment Clinical Impression Statement: Pt s/p R THR.  Pt would benefit from acute PT services to improve independence with transfers and ambulation and increasing knowledge of mobility while maintaining PWB and posterior hip precautions.  Pt with low BP this morning so deferred ambulation for pt safety.  Pt would like to d/c home and reports her neighbor can stay with her while her niece is working.  Pt will likely progress well but understands if she needs for assist that d/c may be to SNF.  Will continue to assess d/c needs. PT Recommendation/Assessment: Patient will need skilled PT in the acute care venue PT Problem List: Decreased strength;Decreased activity tolerance;Decreased mobility;Decreased knowledge of precautions;Decreased safety awareness;Decreased knowledge of use of DME PT Therapy Diagnosis : Difficulty walking PT Plan PT Frequency: 7X/week PT Treatment/Interventions: DME instruction;Gait training;Functional mobility training;Therapeutic activities;Therapeutic exercise;Neuromuscular re-education;Patient/family  education PT Recommendation Follow Up Recommendations: Home health PT;Supervision for mobility/OOB (vs. SNF) Equipment Recommended: None recommended by PT PT Goals  Acute Rehab PT Goals PT Goal Formulation: With patient Time For Goal Achievement: 7 days Pt will go Supine/Side to Sit: with modified independence PT Goal: Supine/Side to Sit - Progress: Goal set today Pt will go Sit to Supine/Side: with modified independence PT Goal: Sit to Supine/Side - Progress: Goal set today Pt will go Sit to Stand: with modified independence PT Goal: Sit to Stand - Progress: Goal set today Pt will go Stand to Sit: with modified independence PT Goal: Stand to Sit - Progress: Goal set today Pt will Ambulate: >150 feet;with supervision;with rolling walker PT Goal: Ambulate - Progress: Goal set today Pt will Perform Home Exercise Program: with supervision, verbal cues required/provided PT Goal: Perform Home Exercise Program - Progress: Goal set today  PT Evaluation Precautions/Restrictions  Precautions Precautions: Posterior Hip Restrictions Weight Bearing Restrictions: Yes RLE Weight Bearing: Partial weight bearing RLE Partial Weight Bearing Percentage or Pounds: 25-50% Prior Functioning  Home Living Lives With:  (niece) Receives Help From: Neighbor Type of Home: House Home Layout: One level Home Access: Level entry Home Adaptive Equipment: Walker - rolling;Raised toilet seat with rails;Shower chair with back;Hand-held shower hose;Other (comment) (3 prong cane) Additional Comments: Pt reports she has a housekeeper Prior Function Level of Independence: Requires assistive device for independence Comments: uses RW for mobility Cognition Cognition Arousal/Alertness: Awake/alert Overall Cognitive Status: Appears within functional limits for tasks assessed Orientation Level: Oriented X4 Sensation/Coordination   Extremity Assessment RLE Assessment RLE Assessment: Not tested (limited movement  against gravity observed) LLE Assessment LLE Assessment: Within Functional Limits (  per functional observation) Mobility (including Balance) Bed Mobility Bed Mobility: Yes Supine to Sit: 3: Mod assist;HOB elevated (Comment degrees) Supine to Sit Details (indicate cue type and reason): assist for R LE and trunk, verbal cues for technique Sitting - Scoot to Edge of Bed: 4: Min assist Sitting - Scoot to [employer] of Bed Details (indicate cue type and reason): assist with bed pad to help R hip to EOB Transfers Transfers: Yes Sit to Stand: 4: Min assist;From elevated surface;With upper extremity assist;From bed Sit to Stand Details (indicate cue type and reason): min/guard, verbal cue for R LE forward and hand placement Stand to Sit: With upper extremity assist;To chair/3-in-1;With armrests;4: Min assist Stand to Sit Details: min/guard, verbal cue for R LE forward and hand placement Stand Pivot Transfers: 4: Min assist Stand Pivot Transfer Details (indicate cue type and reason): min/guard, verbal cue for turning keeping precautions Ambulation/Gait Ambulation/Gait: No (deferred 2* low BP this morning)    Exercise    End of Session PT - End of Session Equipment Utilized During Treatment: Gait belt Activity Tolerance: Patient tolerated treatment well Patient left: in chair;with call bell in reach Nurse Communication:  (hip precautions handout in room) General Behavior During Session: [facility] for tasks performed Cognition: [facility] for tasks performed  Braun Rocca,KATHrine E 01/16/2012, 11:56 AM Pager: [phone]

## 2012-01-16 NOTE — Progress Notes (Signed)
Subjective: 1 Day Post-Op Procedure(s) (LRB): TOTAL HIP ARTHROPLASTY (Right) Patient reports pain as mild.   Patient seen in rounds with Dr. Wynelle Link. Patient has complaints of nausea this AM.  Also noted to have low pressures.  Will treat nausea and give fluids.  Monitor I&O's. We will start therapy today. Plan is to go home after hospital stay but will also look into SNF as a backup.  Objective: Vital signs in last 24 hours: Temp:  [96.4 F (35.8 C)-98.2 F (36.8 C)] 98.2 F (36.8 C) (01/31 0145) Pulse Rate:  [45-68] 59  (01/31 0653) Resp:  [7-20] 12  (01/31 0530) BP: (72-136)/(40-76) 81/59 mmHg (01/31 0653) SpO2:  [99 %-100 %] 100 % (01/31 0420) Weight:  [55.339 kg (122 lb)] 55.339 kg (122 lb) (01/30 1941)  Intake/Output from previous day:  Intake/Output Summary (Last 24 hours) at 01/16/12 0741 Last data filed at 01/16/12 [id number]  Gross per 24 hour  Intake 3330.5 ml  Output   1625 ml  Net 1705.5 ml    Intake/Output this shift: UOP 300 this shift  Labs:  [facility] 01/16/12 0439  HGB 8.9*    Basename 01/16/12 0439  WBC 7.7  RBC 2.99*  HCT 26.1*  PLT 110*    Basename 01/16/12 0439  NA 135  K 4.3  CL 107  CO2 24  BUN 19  CREATININE 0.79  GLUCOSE 115*  CALCIUM 8.8   No results found for this basename: LABPT:2,INR:2 in the last 72 hours  Exam - Neurovascular intact Sensation intact distally Dressing - clean, dry, no drainage Motor function intact - moving foot and toes well on exam.  Hemovac pulled without difficulty.  Past Medical History  Diagnosis Date  . Hypertension   . Hyperlipidemia   . Thyroid disease   . Colon polyp   . Renal insufficiency   . Arthritis   . MVP (mitral valve prolapse)     Assessment/Plan: 1 Day Post-Op Procedure(s) (LRB): TOTAL HIP ARTHROPLASTY (Right) Principal Problem:  *Osteoarthritis of hip   Advance diet Up with therapy Discharge home with home health Discharge to SNF in not able to go home.  DVT Prophylaxis  - Xarelto  Protocol Partial-Weight Bearing 25-50% right Leg D/C Knee Immobilizer Hemovac Pulled Begin Therapy Hip Preacutions Keep foley until tomorrow. No vaccines.  [name] 01/16/2012, 7:41 AM

## 2012-01-16 NOTE — Progress Notes (Signed)
Blood pressure low and pt reports light headness and nausea. Notify Gill Clark,P.A. Meds, amlodipine and atenolol place on hold for today. Will continue to monitor blood pressure and give med for nausea.

## 2012-01-17 LAB — CBC
HCT: 19.6 % — ABNORMAL LOW (ref 36.0–46.0)
HCT: 28.4 % — ABNORMAL LOW (ref 36.0–46.0)
Hemoglobin: 6.7 g/dL — CL (ref 12.0–15.0)
Hemoglobin: 9.6 g/dL — ABNORMAL LOW (ref 12.0–15.0)
MCH: 29.9 pg (ref 26.0–34.0)
MCH: 28.8 pg (ref 26.0–34.0)
MCHC: 34.2 g/dL (ref 30.0–36.0)
MCHC: 33.8 g/dL (ref 30.0–36.0)
MCV: 87.5 fL (ref 78.0–100.0)
MCV: 85.3 fL (ref 78.0–100.0)
Platelets: 96 10*3/uL — ABNORMAL LOW (ref 150–400)
Platelets: 104 10*3/uL — ABNORMAL LOW (ref 150–400)
RBC: 2.24 MIL/uL — ABNORMAL LOW (ref 3.87–5.11)
RBC: 3.33 MIL/uL — ABNORMAL LOW (ref 3.87–5.11)
RDW: 13.2 % (ref 11.5–15.5)
RDW: 14.6 % (ref 11.5–15.5)
WBC: 8.7 10*3/uL (ref 4.0–10.5)
WBC: 9.2 10*3/uL (ref 4.0–10.5)

## 2012-01-17 LAB — BASIC METABOLIC PANEL
BUN: 16 mg/dL (ref 6–23)
CO2: 23 mEq/L (ref 19–32)
Calcium: 8.6 mg/dL (ref 8.4–10.5)
Chloride: 108 mEq/L (ref 96–112)
Creatinine, Ser: 0.87 mg/dL (ref 0.50–1.10)
GFR calc Af Amer: 76 mL/min — ABNORMAL LOW (ref >90)
GFR calc non Af Amer: 65 mL/min — ABNORMAL LOW (ref >90)
Glucose, Bld: 112 mg/dL — ABNORMAL HIGH (ref 70–99)
Potassium: 3.9 mEq/L (ref 3.5–5.1)
Sodium: 136 mEq/L (ref 135–145)

## 2012-01-17 LAB — PREPARE RBC (CROSSMATCH)

## 2012-01-17 MED ORDER — ACETAMINOPHEN 325 MG PO TABS
650.0000 mg | ORAL_TABLET | Freq: Once | ORAL | Status: AC
  Administered 2012-01-17: 650 mg via ORAL
  Filled 2012-01-17: qty 2

## 2012-01-17 MED ORDER — DIPHENHYDRAMINE HCL 25 MG PO CAPS
25.0000 mg | ORAL_CAPSULE | Freq: Once | ORAL | Status: AC
  Administered 2012-01-17: 25 mg via ORAL
  Filled 2012-01-17: qty 1

## 2012-01-17 NOTE — Progress Notes (Signed)
Update Note - Patient receiving blood to day.  The low HGB this morning was 6.7.  She very well may need more blood.  Patient agreeable to SNF and is requesting [facility] and states that she has been there before. CSW completed FL-2 and faxed to [facility].  She will not be ready on Saturday.  Dr. Wynelle Link states that she should be ready on Monday and will keep until then.  HGB to be rechecked.  Therapy note: Pt still requiring increased assist and has not yet ambulated so updated d/c recommendations to SNF. Patient was not even able to get OOB for any ambulation on Friday, so they will not be ready tomorrow.  Keep thru the weekend. Arlee Muslim, PA-C

## 2012-01-17 NOTE — Progress Notes (Signed)
Physical Therapy Treatment Patient Details Name: [name] MRN: [id number] DOB: August 10, 1941 Today's Date: 01/17/2012 Time: [id number] Charge: TE PT Assessment/Plan  PT - Assessment/Plan Comments on Treatment Session: Pt back in bed upon return visit and did not wish for OOB activity reporting sat up too long in recliner today but agreeable to exercises. PT Plan: Discharge plan remains appropriate Follow Up Recommendations: Skilled nursing facility Equipment Recommended: Defer to next venue PT Goals  Acute Rehab PT Goals PT Goal: Perform Home Exercise Program - Progress: Progressing toward goal  PT Treatment Precautions/Restrictions  Precautions Precautions: Posterior Hip Restrictions Weight Bearing Restrictions: Yes RLE Weight Bearing: Partial weight bearing RLE Partial Weight Bearing Percentage or Pounds: 25-50% Mobility (including Balance)      Exercise  Total Joint Exercises Ankle Circles/Pumps: AROM;Both;20 reps;Supine Quad Sets: AROM;Strengthening;Both;20 reps;Supine Gluteal Sets: AROM;Strengthening;Both;Other reps (comment);Supine (20 reps) Short Arc Quad: Strengthening;Right;Other reps (comment);Supine;AROM (20 reps) Heel Slides: AAROM;Strengthening;Right;20 reps;Supine (within precautions) Hip ABduction/ADduction: AAROM;Strengthening;Right;20 reps;Supine End of Session PT - End of Session Activity Tolerance: Patient tolerated treatment well Patient left: in bed;with call bell in reach;with family/visitor present General Behavior During Session: [facility] for tasks performed Cognition: [facility] for tasks performed  Carlean Crowl,KATHrine E 01/17/2012, 4:32 PM Pager: [phone]

## 2012-01-17 NOTE — Progress Notes (Signed)
OT Note:  Pt is receiving blood:  HgB 6.7.  Will likely see her tomorrow.  Tuttle, OTR/L [id number] 01/17/2012

## 2012-01-17 NOTE — Progress Notes (Signed)
Physical Therapy Treatment Patient Details Name: [name] MRN: [id number] DOB: 1941-11-03 Today's Date: 01/17/2012 Time: [id number] Charge: TA PT Assessment/Plan  PT - Assessment/Plan Comments on Treatment Session: Pt s/p one unit PRBC and better BP today.  Pt reports too much R hip pain to ambulate this morning but assist to recliner.  Pt still requiring increased assist and has not yet ambulated so updated d/c recommendations to SNF.  [name] brother also pulled me aside in hallway and reports her niece will not be able to care for her at home at current level. PT Plan: Discharge plan needs to be updated Follow Up Recommendations: Skilled nursing facility Equipment Recommended: Defer to next venue PT Goals  Acute Rehab PT Goals PT Goal: Supine/Side to Sit - Progress: Progressing toward goal PT Goal: Sit to Stand - Progress: Progressing toward goal PT Goal: Stand to Sit - Progress: Progressing toward goal  PT Treatment Precautions/Restrictions  Precautions Precautions: Posterior Hip Restrictions Weight Bearing Restrictions: Yes RLE Weight Bearing: Partial weight bearing RLE Partial Weight Bearing Percentage or Pounds: 25-50% Mobility (including Balance) Bed Mobility Bed Mobility: Yes Supine to Sit: 3: Mod assist;HOB elevated (Comment degrees) Supine to Sit Details (indicate cue type and reason):   assist for R LE and trunk, verbal cues for technique  Sitting - Scoot to Edge of Bed: 4: Min assist Sitting - Scoot to [employer] of Bed Details (indicate cue type and reason): assist with bed pad to help R hip to EOB Transfers Transfers: Yes Sit to Stand: 3: Mod assist;From elevated surface;With upper extremity assist;From bed Sit to Stand Details (indicate cue type and reason): +2 for safety, assist for weakness, verbal cues for placement of upper and lower extremities Stand to Sit: With upper extremity assist;4: Min assist;To chair/3-in-1;With armrests Stand to Sit Details: +2 for safety,  verbal cues for R LE forward and hand placement Stand Pivot Transfers: 4: Min assist Stand Pivot Transfer Details (indicate cue type and reason): with RW, verbal cues for sequence as pt took a couple steps to chair Ambulation/Gait Ambulation/Gait: No (pt reports too much pain to ambulate)    Exercise    End of Session PT - End of Session Equipment Utilized During Treatment: Gait belt Activity Tolerance: Patient limited by pain Patient left: in chair;with call bell in reach General Behavior During Session: [facility] for tasks performed Cognition: [facility] for tasks performed  Iza Preston,KATHrine E 01/17/2012, 12:00 PM Pager: [phone]

## 2012-01-17 NOTE — Progress Notes (Signed)
CSW met with patient. Patient is alert and oriented. Discussed need for SNF. Patient agreeable to SNF and is requesting [facility] and states that she has been there before. CSW completed FL-2 and faxed to [facility]. They can accept on Saturday if discharge summary is completed and faxed to them today. CSW communicated same to [place] on [address]. She will attempt to get a discharge summary completed. Psychosocial assessment completed and placed in shadow chart.  Chelsea Pedretti C. Lozano MSW, [place]

## 2012-01-17 NOTE — Progress Notes (Signed)
Subjective: 2 Days Post-Op Procedure(s) (LRB): TOTAL HIP ARTHROPLASTY (Right) Patient reports pain as mild.   Patient has complaints of dizziness yesterday but has not been up yet this morning.  She feels weak today.  HGB is down to 6.7.  Discussed blood.  Two units this morning and recheck level.  Plan is to go home after the hospital stay.  Objective: Vital signs in last 24 hours: Temp:  [97.9 F (36.6 C)-99.4 F (37.4 C)] 98 F (36.7 C) (02/01 0615) Pulse Rate:  [58-79] 79  (02/01 0615) Resp:  [14-16] 14  (02/01 0615) BP: (72-104)/(38-62) 94/57 mmHg (02/01 0615) SpO2:  [97 %-99 %] 99 % (02/01 0615)  Intake/Output from previous day:  Intake/Output Summary (Last 24 hours) at 01/17/12 0752 Last data filed at 01/17/12 0700  Gross per 24 hour  Intake 2192.25 ml  Output   1600 ml  Net 592.25 ml    Intake/Output this shift:    Labs:  Basename 01/17/12 0420 01/16/12 0439  HGB 6.7* 8.9*    Basename 01/17/12 0420 01/16/12 0439  WBC 8.7 7.7  RBC 2.24* 2.99*  HCT 19.6* 26.1*  PLT 96* 110*    Basename 01/17/12 0420 01/16/12 0439  NA 136 135  K 3.9 4.3  CL 108 107  CO2 23 24  BUN 16 19  CREATININE 0.87 0.79  GLUCOSE 112* 115*  CALCIUM 8.6 8.8   No results found for this basename: LABPT:2,INR:2 in the last 72 hours  Exam - Neurovascular intact Sensation intact distally Dressing/Incision - clean, dry, no drainage Motor function intact - moving foot and toes well on exam.   Past Medical History  Diagnosis Date  . Hypertension   . Hyperlipidemia   . Thyroid disease   . Colon polyp   . Renal insufficiency   . Arthritis   . MVP (mitral valve prolapse)     Assessment/Plan: 2 Days Post-Op Procedure(s) (LRB): TOTAL HIP ARTHROPLASTY (Right) Principal Problem:  *Osteoarthritis of hip  Advance diet Discharge home with home health Hold therapy until at least one unit of blood. Keep foley until after blood completed. Wants to go home but may need SNF if does not  progress.  Will see how see does. DVT Prophylaxis - Xarelto  Protocol Partial-Weight Bearing 25-50% right Leg  PERKINS, ALEXZANDREW 01/17/2012, 7:52 AM

## 2012-01-17 NOTE — Progress Notes (Signed)
  CARE MANAGEMENT NOTE 01/17/2012  Patient:  [name], [name]   Account Number:  [id number]  Date Initiated:  01/16/2012  Documentation initiated by:  Sherrin Daisy  Subjective/Objective Assessment:   dx osteoarthritis rt hip: total hip replacemnt     Action/Plan:   Pt initially voiced that she plans to go home with spouse as caregiver and use [facility] services.Plan changed to SNF nex day, CSW notified   Anticipated DC Date:  01/19/2012   Anticipated DC Plan:  SKILLED NURSING FACILITY  In-house referral  Clinical Social Worker      DC Planning Services  CM consult      [facility] Choice  NA   Choice offered to / List presented to:  NA   DME arranged  NA      DME agency  NA     [place] arranged  NA      [place] agency  NA   Status of service:  Completed, signed off

## 2012-01-18 LAB — TYPE AND SCREEN
ABO/RH(D): B POS
Antibody Screen: NEGATIVE
Unit division: 0
Unit division: 0

## 2012-01-18 LAB — CBC
HCT: 25.7 % — ABNORMAL LOW (ref 36.0–46.0)
Hemoglobin: 8.8 g/dL — ABNORMAL LOW (ref 12.0–15.0)
MCH: 29.3 pg (ref 26.0–34.0)
MCHC: 34.2 g/dL (ref 30.0–36.0)
MCV: 85.7 fL (ref 78.0–100.0)
Platelets: 105 10*3/uL — ABNORMAL LOW (ref 150–400)
RBC: 3 MIL/uL — ABNORMAL LOW (ref 3.87–5.11)
RDW: 14.6 % (ref 11.5–15.5)
WBC: 9 10*3/uL (ref 4.0–10.5)

## 2012-01-18 LAB — BASIC METABOLIC PANEL
BUN: 15 mg/dL (ref 6–23)
CO2: 25 mEq/L (ref 19–32)
Calcium: 8.6 mg/dL (ref 8.4–10.5)
Chloride: 105 mEq/L (ref 96–112)
Creatinine, Ser: 0.84 mg/dL (ref 0.50–1.10)
GFR calc Af Amer: 79 mL/min — ABNORMAL LOW (ref >90)
GFR calc non Af Amer: 68 mL/min — ABNORMAL LOW (ref >90)
Glucose, Bld: 106 mg/dL — ABNORMAL HIGH (ref 70–99)
Potassium: 3.8 mEq/L (ref 3.5–5.1)
Sodium: 135 mEq/L (ref 135–145)

## 2012-01-18 MED ORDER — ACETAMINOPHEN 10 MG/ML IV SOLN: 1000.0000 mg | Freq: Once | INTRAVENOUS | Status: DC

## 2012-01-18 NOTE — Progress Notes (Signed)
Subjective: Patient laying in bed comfortable denies any complaints or any problems   Objective: Vital signs in last 24 hours: Temp:  [97.9 F (36.6 C)-100.2 F (37.9 C)] 98.6 F (37 C) (02/02 0457) Pulse Rate:  [75-100] 81  (02/02 0457) Resp:  [14-20] 18  (02/02 0457) BP: (88-127)/(49-70) 104/62 mmHg (02/02 0457) SpO2:  [96 %-98 %] 98 % (02/02 0457)  Intake/Output from previous day: 02/01 0701 - 02/02 0700 In: 2582.1 [P.O.:1720; I.V.:250; Blood:612.1] Out: 1100 [Urine:1100] Intake/Output this shift:     Basename 01/18/12 0339 01/17/12 1720 01/17/12 0420 01/16/12 0439  HGB 8.8* 9.6* 6.7* 8.9*    Basename 01/18/12 0339 01/17/12 1720  WBC 9.0 9.2  RBC 3.00* 3.33*  HCT 25.7* 28.4*  PLT 105* 104*    Basename 01/18/12 0339 01/17/12 0420  NA 135 136  K 3.8 3.9  CL 105 108  CO2 25 23  BUN 15 16  CREATININE 0.84 0.87  GLUCOSE 106* 112*  CALCIUM 8.6 8.6   No results found for this basename: LABPT:2,INR:2 in the last 72 hours  Conscious alert appropriate laying in bed watching TV appears to be in no distress. Her right hip dressing is intact no drainage no erythema around the hip thigh and calf were soft and nontender her leg neuromotor vascularly intact.  Assessment/Plan: Postop day #3 status post right total hip arthroplasty stable Acute postoperative blood loss anemia received blood transfusion doing well Slow progress with physical therapy he will need skilled nursing facility next Monday Continue to monitor CBC Continue with physical therapy per total hip protocol   Zhane Bluitt W 01/18/2012, 8:20 AM

## 2012-01-18 NOTE — Evaluation (Signed)
Occupational Therapy Evaluation Patient Details Name: [name] MRN: [id number] DOB: October 14, 1941 Today's Date: 01/18/2012 11:45-12:11  Problem List:  Patient Active Problem List  Diagnoses  . NONSPECIFIC ABN FINDING RAD & OTH EXAM GI TRACT  . Hyperlipidemia  . Hypertension  . Osteoarthritis  . Pancreatic adenoma  . Exocrine pancreatic insufficiency  . Osteoarthritis of hip    Past Medical History:  Past Medical History  Diagnosis Date  . Hypertension   . Hyperlipidemia   . Thyroid disease   . Colon polyp   . Renal insufficiency   . Arthritis   . MVP (mitral valve prolapse)    Past Surgical History:  Past Surgical History  Procedure Date  . Back surgery 2011    LOWER BACK FUSION L4-5  . Neck surgery 2009    NECK FUSION C5-6; C6-7  . Cholecystectomy   . Microcystic adenoma 02/12    pancreas  . Total hip arthroplasty 01/15/2012    Procedure: TOTAL HIP ARTHROPLASTY;  Surgeon: Gearlean Alf, MD;  Location: [facility] ORS;  Service: Orthopedics;  Laterality: Right;    OT Assessment/Plan/Recommendation OT Assessment Clinical Impression Statement: This 71 yo female s/p RTHA. All education completed from an acute standpoint, will need HHOT to address tub/shower situation. D/C from acute OT. Pt to purchase sock aid on her own. OT Recommendation/Assessment: All further OT needs can be met in the next venue of care OT Problem List: Decreased strength;Impaired balance (sitting and/or standing);Decreased knowledge of use of DME or AE OT Therapy Diagnosis : Generalized weakness OT Recommendation Follow Up Recommendations: Home health OT;Other (comment) (really needs this due to different type of tub set up at home, hip precautions, and PWB status) Equipment Recommended: None recommended by OT Individuals Consulted Consulted and Agree with Results and Recommendations: Patient OT Goals    OT Evaluation Precautions/Restrictions  Precautions Precautions: Posterior Hip Required  Braces or Orthoses: No Restrictions Weight Bearing Restrictions: Yes RLE Weight Bearing: Partial weight bearing RLE Partial Weight Bearing Percentage or Pounds: 25-50% Prior Functioning Home Living Lives With: Other (Comment) (niece) Receives Help From: Family;Neighbor Type of Home: House Home Layout: One level Home Access: Level entry Bathroom Shower/Tub: Tub/shower unit;Curtain Bathroom Toilet: Handicapped height Bathroom Accessibility: Yes How Accessible: Accessible via walker Supreme: Bedside commode/3-in-1;Grab bars around toilet;Grab bars in shower;Hand-held shower hose;Quad cane;Reacher;Shower chair with back;Walker - rolling Prior Function Level of Independence: Requires assistive device for independence (RW and tub seat with back) Driving: Yes Vocation: Retired ADL ADL Eating/Feeding: Simulated;Independent Where Assessed - Eating/Feeding: Chair Grooming: Performed;Wash/dry hands;Supervision/safety Where Assessed - Grooming: Unsupported;Standing at sink Upper Body Bathing: Simulated;Set up Where Assessed - Upper Body Bathing: Unsupported;Sitting, chair Lower Body Bathing: Simulated;Moderate assistance Lower Body Bathing Details (indicate cue type and reason): lower legs and feet Where Assessed - Lower Body Bathing: Unsupported;Sit to stand from chair Upper Body Dressing: Simulated;Set up Where Assessed - Upper Body Dressing: Sitting, chair;Unsupported Lower Body Dressing: Simulated;Set up (with AE) Where Assessed - Lower Body Dressing: Sit to stand from chair;Unsupported Toilet Transfer: Performed;Supervision/safety Toilet Transfer Method: Counselling psychologist: Raised toilet seat with arms (or 3-in-1 over toilet) Toileting - Clothing Manipulation: Performed;Independent Where Assessed - Toileting Clothing Manipulation: Standing Toileting - Hygiene: Performed;Independent Where Assessed - Toileting Hygiene: Sit on 3-in-1 or  toilet Tub/Shower Transfer: Not assessed;Other (comment) (defer to [facility] due to set up at her house) Tub/Shower Transfer Method: Not assessed Equipment Used: Rolling walker;Sock aid;Reacher Ambulation Related to ADLs: Min guard A Vision/Perception  Cognition Cognition Arousal/Alertness: Awake/alert Overall Cognitive Status: Appears within functional limits for tasks assessed Orientation Level: Oriented X4 Sensation/Coordination   Extremity Assessment RUE Assessment RUE Assessment: Within Functional Limits LUE Assessment LUE Assessment: Within Functional Limits Mobility  Bed Mobility Bed Mobility: No Transfers Transfers: Yes Sit to Stand: With upper extremity assist;From chair/3-in-1 (minguard A) Stand to Sit: With upper extremity assist;With armrests;To chair/3-in-1 (minguard A) Exercises   End of Session OT - End of Session Equipment Utilized During Treatment: Other (comment) (RW) Activity Tolerance: Patient tolerated treatment well Patient left: in chair;with call bell in reach;Other (comment) (phone within reach) General Behavior During Session: [facility] for tasks performed Cognition: [facility] for tasks performed   Almon Register [id number] 01/18/2012, 12:24 PM

## 2012-01-18 NOTE — Progress Notes (Signed)
Physical Therapy Treatment Patient Details Name: LARENDA Gomez MRN: [id number] DOB: 25-Jul-1941 Today's Date: 01/18/2012  [id number]  PT Assessment/Plan  PT - Assessment/Plan Comments on Treatment Session: Pt doing much better today following blood transfusion yesterday.  BP remained stable throughout session and pt was able to tolerate 120' of ambulation.  Continues to require cues about hip precautions and WB status.   PT Plan: Discharge plan remains appropriate PT Frequency: 7X/week Follow Up Recommendations: Skilled nursing facility Equipment Recommended: None recommended by OT PT Goals  Acute Rehab PT Goals PT Goal Formulation: With patient Time For Goal Achievement: 7 days Pt will go Supine/Side to Sit: with modified independence PT Goal: Supine/Side to Sit - Progress: Progressing toward goal Pt will go Sit to Stand: with modified independence PT Goal: Sit to Stand - Progress: Progressing toward goal Pt will go Stand to Sit: with modified independence PT Goal: Stand to Sit - Progress: Progressing toward goal Pt will Ambulate: >150 feet;with supervision;with rolling walker PT Goal: Ambulate - Progress: Progressing toward goal  PT Treatment Precautions/Restrictions  Precautions Precautions: Posterior Hip Required Braces or Orthoses: No Restrictions Weight Bearing Restrictions: Yes RLE Weight Bearing: Partial weight bearing RLE Partial Weight Bearing Percentage or Pounds: 25-50% Mobility (including Balance) Bed Mobility Bed Mobility: Yes Supine to Sit: 3: Mod assist;HOB elevated (Comment degrees) Supine to Sit Details (indicate cue type and reason): Requires assist for R LE and some assist for trunk with cues for hand placement to assist trunk.  Sitting - Scoot to Edge of Bed: 4: Min assist Sitting - Scoot to [employer] of Bed Details (indicate cue type and reason): cues for technique, some assist with pad Transfers Transfers: Yes Sit to Stand: From elevated surface;With  upper extremity assist;From bed;4: Min assist Sit to Stand Details (indicate cue type and reason): Requires cues for R LE and hand placement.  Stand to Sit: 4: Min assist;With upper extremity assist;With armrests;To chair/3-in-1 Stand to Sit Details: min/guard for safety with cues for hand placement and LE management.  Ambulation/Gait Ambulation/Gait: Yes Ambulation/Gait Assistance: 4: Min assist Ambulation/Gait Assistance Details (indicate cue type and reason): Min/guard for safety with cues for sequencing/technique and for upright posture.   Ambulation Distance (Feet): 120 Feet Assistive device: Rolling walker Gait Pattern: Step-to pattern;Trunk flexed    Exercise    End of Session PT - End of Session Equipment Utilized During Treatment: Gait belt Activity Tolerance: Patient tolerated treatment well Patient left: in chair;with call bell in reach General Behavior During Session: [facility] for tasks performed Cognition: [facility] for tasks performed  Page, [name] 01/18/2012, 1:00 PM

## 2012-01-18 NOTE — Progress Notes (Signed)
Physical Therapy Treatment Patient Details Name: [name] MRN: [id number] DOB: 05/14/1941 Today's Date: 01/18/2012 Time: [id number]  1G PT Assessment/Plan  PT - Assessment/Plan Comments on Treatment Session: Pt continuing to perform well. BID ambulation today and pt tolerated sessions well.  PT Plan: Discharge plan remains appropriate PT Frequency: 7X/week Follow Up Recommendations: Skilled nursing facility Equipment Recommended: Defer to next venue PT Goals  Acute Rehab PT Goals PT Goal Formulation: With patient Time For Goal Achievement: 7 days Pt will go Supine/Side to Sit: with modified independence PT Goal: Supine/Side to Sit - Progress: Progressing toward goal Pt will go Sit to Stand: with modified independence PT Goal: Sit to Stand - Progress: Progressing toward goal Pt will go Stand to Sit: with modified independence PT Goal: Stand to Sit - Progress: Progressing toward goal Pt will Ambulate: >150 feet;with supervision;with rolling walker PT Goal: Ambulate - Progress: Progressing toward goal  PT Treatment Precautions/Restrictions  Precautions Precautions: Posterior Hip Precaution Comments: Pt able to recall 2/3 hip precautions and PWB status.  Required Braces or Orthoses: No Restrictions Weight Bearing Restrictions: Yes RLE Weight Bearing: Partial weight bearing RLE Partial Weight Bearing Percentage or Pounds: 25-50% Mobility (including Balance) Transfers Transfers: Yes Sit to Stand: 4: Min assist;With upper extremity assist;With armrests;From chair/3-in-1 Sit to Stand Details (indicate cue type and reason): VCs safety, technique, hand placement. Assist to rise, stabilize.  Stand to Sit: 4: Min assist;With upper extremity assist;With armrests;To chair/3-in-1 Stand to Sit Details: VCs safety, technique, hand placement. Assist to control descent.  Ambulation/Gait Ambulation/Gait: Yes Ambulation/Gait Assistance: 4: Min assist Ambulation/Gait Assistance Details  (indicate cue type and reason): VCs safety, technique, sequence, posture, R foot positioning (especially with turns).  Ambulation Distance (Feet): 120 Feet Assistive device: Rolling walker Gait Pattern: Step-to pattern;Trunk flexed    Exercise    End of Session PT - End of Session Equipment Utilized During Treatment: Gait belt Activity Tolerance: Patient tolerated treatment well Patient left: in chair;with call bell in reach;with family/visitor present General Behavior During Session: [facility] for tasks performed Cognition: [facility] for tasks performed  [name], [name] [facility] 01/18/2012, 2:45 PM [phone]

## 2012-01-19 LAB — CBC
HCT: 26.6 % — ABNORMAL LOW (ref 36.0–46.0)
Hemoglobin: 9 g/dL — ABNORMAL LOW (ref 12.0–15.0)
MCH: 29.4 pg (ref 26.0–34.0)
MCHC: 33.8 g/dL (ref 30.0–36.0)
MCV: 86.9 fL (ref 78.0–100.0)
Platelets: 127 10*3/uL — ABNORMAL LOW (ref 150–400)
RBC: 3.06 MIL/uL — ABNORMAL LOW (ref 3.87–5.11)
RDW: 14.6 % (ref 11.5–15.5)
WBC: 7.5 10*3/uL (ref 4.0–10.5)

## 2012-01-19 NOTE — Progress Notes (Signed)
Physical Therapy Treatment Patient Details Name: [name] MRN: [id number] DOB: [birth year] Today's Date: 01/19/2012 10:15-10:50 GE  PT Assessment/Plan  PT - Assessment/Plan Comments on Treatment Session: pt is continuing to improve with ambulation.  She will need continued PT at SNF to reach independence in bed mobility to return to home.  Nursing to ambulate with patient this afternoon as plan is for SNF PT Plan: Discharge plan remains appropriate Follow Up Recommendations: Skilled nursing facility PT Goals  Acute Rehab PT Goals PT Goal: Supine/Side to Sit - Progress: Progressing toward goal PT Goal: Sit to Supine/Side - Progress: Progressing toward goal PT Goal: Sit to Stand - Progress: Progressing toward goal PT Goal: Stand to Sit - Progress: Progressing toward goal PT Goal: Ambulate - Progress: Progressing toward goal PT Goal: Perform Home Exercise Program - Progress: Progressing toward goal  PT Treatment Precautions/Restrictions  Precautions Precautions: Posterior Hip Precaution Comments: Pt able to recall 2/3 hip precautions and PWB status.  Required Braces or Orthoses: No Restrictions Weight Bearing Restrictions: Yes RLE Weight Bearing: Partial weight bearing RLE Partial Weight Bearing Percentage or Pounds: 25-50% Mobility (including Balance) Bed Mobility Bed Mobility: Yes Supine to Sit: 3: Mod assist;HOB elevated (Comment degrees) Supine to Sit Details (indicate cue type and reason): pt has difficulty with bridging to clear hips to get to EOB Sitting - Scoot to Edge of Bed: 4: Min assist Sit to Supine: 4: Min assist Transfers Transfers: Yes Sit to Stand: 4: Min assist;With upper extremity assist;With armrests;From chair/3-in-1 Sit to Stand Details (indicate cue type and reason): verbal cues for for THR precautions and to keep PWB Stand to Sit: 4: Min assist;With upper extremity assist;With armrests;To chair/3-in-1 Stand to Sit Details: cues to keep shoulders back  and extend right leg out Ambulation/Gait Ambulation/Gait: Yes Ambulation/Gait Assistance: 5: Supervision Ambulation/Gait Assistance Details (indicate cue type and reason): cues for sequence an to limit weight bearing Ambulation Distance (Feet): 200 Feet Assistive device: Rolling walker Gait Pattern: Step-to pattern;Trunk flexed (pt has tendecy to step through which puts too much weight ) Stairs: No Wheelchair Mobility Wheelchair Mobility: No  Posture/Postural Control Posture/Postural Control: No significant limitations Balance Balance Assessed: No Exercise  Total Joint Exercises Ankle Circles/Pumps: AROM;10 reps;Both Quad Sets: AROM;Both;10 reps;Supine Gluteal Sets: AROM;Both;5 reps;Supine Short Arc Quad: AROM;Right;Supine;10 reps Heel Slides: AAROM;Right;5 reps;Supine Hip ABduction/ADduction: AAROM;Right;5 reps;Supine Marching in Standing: AROM;5 reps;Standing End of Session PT - End of Session Equipment Utilized During Treatment: Gait belt Activity Tolerance: Patient tolerated treatment well Patient left: in chair;with call bell in reach Nurse Communication:  (nursing tech to walk with patient this afternoon) General Behavior During Session: [facility] for tasks performed Cognition: [facility] for tasks performed  Norwood Levo 01/19/2012, 11:57 AM

## 2012-01-19 NOTE — Progress Notes (Signed)
[name]  MRN: [id number] DOB/Age: 09/13/1941 71 y.o. Physician: Rada Hay Procedure: Procedure(s) (LRB): TOTAL HIP ARTHROPLASTY (Right)     Subjective: Back in bed after trip to bathroom. Minimal pain. No c/o.   Vital Signs Temp:  [98.7 F (37.1 C)-99.3 F (37.4 C)] 99.3 F (37.4 C) (02/03 0605) Pulse Rate:  [73-82] 74  (02/03 0605) Resp:  [18] 18  (02/03 0605) BP: (100-130)/(59-70) 130/70 mmHg (02/03 0605) SpO2:  [97 %-99 %] 97 % (02/03 0605)  Lab Results  Basename 01/18/12 0339 01/17/12 1720  WBC 9.0 9.2  HGB 8.8* 9.6*  HCT 25.7* 28.4*  PLT 105* 104*   BMET  Basename 01/18/12 0339 01/17/12 0420  NA 135 136  K 3.8 3.9  CL 105 108  CO2 25 23  GLUCOSE 106* 112*  BUN 15 16  CREATININE 0.84 0.87  CALCIUM 8.6 8.6   INR  Date Value Range Status  01/08/2012 0.98  0.00-1.49 (no units) Final     Exam Right hip incision/dressing dry. Right LE NVI        Plan S/p R THA Cont PT/OT Rehab plans for after DC hopeful for [facility] for Dr.Kevin Supple 01/19/2012, 8:48 AM

## 2012-01-20 DIAGNOSIS — E039 Hypothyroidism, unspecified: Secondary | ICD-10-CM | POA: Diagnosis not present

## 2012-01-20 DIAGNOSIS — I10 Essential (primary) hypertension: Secondary | ICD-10-CM | POA: Diagnosis not present

## 2012-01-20 DIAGNOSIS — D136 Benign neoplasm of pancreas: Secondary | ICD-10-CM | POA: Diagnosis not present

## 2012-01-20 DIAGNOSIS — M25559 Pain in unspecified hip: Secondary | ICD-10-CM | POA: Diagnosis not present

## 2012-01-20 DIAGNOSIS — D62 Acute posthemorrhagic anemia: Secondary | ICD-10-CM | POA: Diagnosis not present

## 2012-01-20 DIAGNOSIS — Z86010 Personal history of colonic polyps: Secondary | ICD-10-CM | POA: Diagnosis not present

## 2012-01-20 DIAGNOSIS — Z96649 Presence of unspecified artificial hip joint: Secondary | ICD-10-CM | POA: Diagnosis not present

## 2012-01-20 DIAGNOSIS — M1990 Unspecified osteoarthritis, unspecified site: Secondary | ICD-10-CM | POA: Diagnosis not present

## 2012-01-20 DIAGNOSIS — M1610 Unilateral primary osteoarthritis, unspecified hip: Secondary | ICD-10-CM | POA: Diagnosis not present

## 2012-01-20 DIAGNOSIS — Z5189 Encounter for other specified aftercare: Secondary | ICD-10-CM | POA: Diagnosis not present

## 2012-01-20 DIAGNOSIS — D649 Anemia, unspecified: Secondary | ICD-10-CM | POA: Diagnosis not present

## 2012-01-20 DIAGNOSIS — S79919A Unspecified injury of unspecified hip, initial encounter: Secondary | ICD-10-CM | POA: Diagnosis not present

## 2012-01-20 DIAGNOSIS — Z9289 Personal history of other medical treatment: Secondary | ICD-10-CM

## 2012-01-20 DIAGNOSIS — F329 Major depressive disorder, single episode, unspecified: Secondary | ICD-10-CM | POA: Diagnosis not present

## 2012-01-20 DIAGNOSIS — E7800 Pure hypercholesterolemia, unspecified: Secondary | ICD-10-CM | POA: Diagnosis not present

## 2012-01-20 MED ORDER — POLYETHYLENE GLYCOL 3350 17 G PO PACK: 17.0000 g | PACK | Freq: Every day | ORAL | Status: AC | PRN

## 2012-01-20 MED ORDER — ACETAMINOPHEN 325 MG PO TABS: 650.0000 mg | ORAL_TABLET | Freq: Four times a day (QID) | ORAL | Status: DC | PRN

## 2012-01-20 MED ORDER — METHOCARBAMOL 500 MG PO TABS: 500.0000 mg | ORAL_TABLET | Freq: Four times a day (QID) | ORAL | Status: AC | PRN

## 2012-01-20 MED ORDER — DSS 100 MG PO CAPS: 100.0000 mg | ORAL_CAPSULE | Freq: Two times a day (BID) | ORAL | Status: AC

## 2012-01-20 MED ORDER — BISACODYL 10 MG RE SUPP: 10.0000 mg | Freq: Every day | RECTAL | Status: AC | PRN

## 2012-01-20 MED ORDER — RIVAROXABAN 10 MG PO TABS: 10.0000 mg | ORAL_TABLET | Freq: Every day | ORAL | Status: DC

## 2012-01-20 MED ORDER — ONDANSETRON HCL 4 MG PO TABS: 4.0000 mg | ORAL_TABLET | Freq: Four times a day (QID) | ORAL | Status: AC | PRN

## 2012-01-20 MED ORDER — OXYCODONE HCL 5 MG PO TABS: 5.0000 mg | ORAL_TABLET | ORAL | Status: AC | PRN

## 2012-01-20 MED ORDER — POLYSACCHARIDE IRON 150 MG PO CAPS: 150.0000 mg | ORAL_CAPSULE | Freq: Every day | ORAL | Status: DC

## 2012-01-20 NOTE — Progress Notes (Signed)
Subjective: 5 Days Post-Op Procedure(s) (LRB): TOTAL HIP ARTHROPLASTY (Right) Patient reports pain as mild.   Patient seen in rounds by Dr. Wynelle Link. Patient doing well today. No complaints.  Ready for transfer to SNF.  Looking into [facility].  Objective: Vital signs in last 24 hours: Temp:  [97.9 F (36.6 C)-98.9 F (37.2 C)] 98 F (36.7 C) (02/04 0520) Pulse Rate:  [70-73] 72  (02/04 0520) Resp:  [18] 18  (02/04 0520) BP: (116-148)/(64-76) 148/76 mmHg (02/04 0520) SpO2:  [97 %-99 %] 97 % (02/04 0520)  Intake/Output from previous day:  Intake/Output Summary (Last 24 hours) at 01/20/12 0746 Last data filed at 01/20/12 0218  Gross per 24 hour  Intake    720 ml  Output    850 ml  Net   -130 ml    Intake/Output this shift:    Labs: Results for orders placed during the hospital encounter of 01/15/12  CBC      Component Value Range   WBC 7.7  4.0 - 10.5 (K/uL)   RBC 2.99 (*) 3.87 - 5.11 (MIL/uL)   Hemoglobin 8.9 (*) 12.0 - 15.0 (g/dL)   HCT 26.1 (*) 36.0 - 46.0 (%)   MCV 87.3  78.0 - 100.0 (fL)   MCH 29.8  26.0 - 34.0 (pg)   MCHC 34.1  30.0 - 36.0 (g/dL)   RDW 13.2  11.5 - 15.5 (%)   Platelets 110 (*) 150 - 400 (K/uL)  BASIC METABOLIC PANEL      Component Value Range   Sodium 135  135 - 145 (mEq/L)   Potassium 4.3  3.5 - 5.1 (mEq/L)   Chloride 107  96 - 112 (mEq/L)   CO2 24  19 - 32 (mEq/L)   Glucose, Bld 115 (*) 70 - 99 (mg/dL)   BUN 19  6 - 23 (mg/dL)   Creatinine, Ser 0.79  0.50 - 1.10 (mg/dL)   Calcium 8.8  8.4 - 10.5 (mg/dL)   GFR calc non Af Amer 82 (*) >90 (mL/min)   GFR calc Af Amer >90  >90 (mL/min)  CBC      Component Value Range   WBC 8.7  4.0 - 10.5 (K/uL)   RBC 2.24 (*) 3.87 - 5.11 (MIL/uL)   Hemoglobin 6.7 (*) 12.0 - 15.0 (g/dL)   HCT 19.6 (*) 36.0 - 46.0 (%)   MCV 87.5  78.0 - 100.0 (fL)   MCH 29.9  26.0 - 34.0 (pg)   MCHC 34.2  30.0 - 36.0 (g/dL)   RDW 13.2  11.5 - 15.5 (%)   Platelets 96 (*) 150 - 400 (K/uL)  BASIC METABOLIC PANEL   Component Value Range   Sodium 136  135 - 145 (mEq/L)   Potassium 3.9  3.5 - 5.1 (mEq/L)   Chloride 108  96 - 112 (mEq/L)   CO2 23  19 - 32 (mEq/L)   Glucose, Bld 112 (*) 70 - 99 (mg/dL)   BUN 16  6 - 23 (mg/dL)   Creatinine, Ser 0.87  0.50 - 1.10 (mg/dL)   Calcium 8.6  8.4 - 10.5 (mg/dL)   GFR calc non Af Amer 65 (*) >90 (mL/min)   GFR calc Af Amer 76 (*) >90 (mL/min)  PREPARE RBC (CROSSMATCH)      Component Value Range   Order Confirmation ORDER PROCESSED BY BLOOD BANK    CBC      Component Value Range   WBC 9.2  4.0 - 10.5 (K/uL)   RBC 3.33 (*) 3.87 -  5.11 (MIL/uL)   Hemoglobin 9.6 (*) 12.0 - 15.0 (g/dL)   HCT 28.4 (*) 36.0 - 46.0 (%)   MCV 85.3  78.0 - 100.0 (fL)   MCH 28.8  26.0 - 34.0 (pg)   MCHC 33.8  30.0 - 36.0 (g/dL)   RDW 14.6  11.5 - 15.5 (%)   Platelets 104 (*) 150 - 400 (K/uL)  CBC      Component Value Range   WBC 9.0  4.0 - 10.5 (K/uL)   RBC 3.00 (*) 3.87 - 5.11 (MIL/uL)   Hemoglobin 8.8 (*) 12.0 - 15.0 (g/dL)   HCT 25.7 (*) 36.0 - 46.0 (%)   MCV 85.7  78.0 - 100.0 (fL)   MCH 29.3  26.0 - 34.0 (pg)   MCHC 34.2  30.0 - 36.0 (g/dL)   RDW 14.6  11.5 - 15.5 (%)   Platelets 105 (*) 150 - 400 (K/uL)  BASIC METABOLIC PANEL      Component Value Range   Sodium 135  135 - 145 (mEq/L)   Potassium 3.8  3.5 - 5.1 (mEq/L)   Chloride 105  96 - 112 (mEq/L)   CO2 25  19 - 32 (mEq/L)   Glucose, Bld 106 (*) 70 - 99 (mg/dL)   BUN 15  6 - 23 (mg/dL)   Creatinine, Ser 0.84  0.50 - 1.10 (mg/dL)   Calcium 8.6  8.4 - 10.5 (mg/dL)   GFR calc non Af Amer 68 (*) >90 (mL/min)   GFR calc Af Amer 79 (*) >90 (mL/min)  CBC      Component Value Range   WBC 7.5  4.0 - 10.5 (K/uL)   RBC 3.06 (*) 3.87 - 5.11 (MIL/uL)   Hemoglobin 9.0 (*) 12.0 - 15.0 (g/dL)   HCT 26.6 (*) 36.0 - 46.0 (%)   MCV 86.9  78.0 - 100.0 (fL)   MCH 29.4  26.0 - 34.0 (pg)   MCHC 33.8  30.0 - 36.0 (g/dL)   RDW 14.6  11.5 - 15.5 (%)   Platelets 127 (*) 150 - 400 (K/uL)    Exam: Neurovascular  intact Sensation intact distally Incision - clean, dry, no drainage Motor function intact - moving foot and toes well on exam.   Assessment/Plan: 5 Days Post-Op Procedure(s) (LRB): TOTAL HIP ARTHROPLASTY (Right) Procedure(s) (LRB): TOTAL HIP ARTHROPLASTY (Right) Past Medical History  Diagnosis Date  . Hypertension   . Hyperlipidemia   . Thyroid disease   . Colon polyp   . Renal insufficiency   . Arthritis   . MVP (mitral valve prolapse)    Principal Problem:  *Osteoarthritis of hip   Discharge to SNF Diet - heart healthy Follow up - in 2 weeks from surgery Activity - PWB Condition Upon Discharge - Good D/C Meds - Oxy, Robaxin, Xarelto DVT Prophylaxis - Xarelto  Protocol   [name] 01/20/2012, 7:46 AM

## 2012-01-20 NOTE — Discharge Summary (Signed)
Physician Discharge Summary   Patient ID: [name] MRN: [id number] DOB/AGE: 07/19/1941 71 y.o.  Admit date: 01/15/2012 Discharge date: 01/20/2012  Primary Diagnosis: End-stage osteoarthritis right hip  Admission Diagnoses: Past Medical History  Diagnosis Date  . Hypertension   . Hyperlipidemia   . Thyroid disease   . Colon polyp   . Renal insufficiency   . Arthritis   . MVP (mitral valve prolapse)    Discharge Diagnoses:  Principal Problem:  *Osteoarthritis of hip Active Problems:  Postop Acute blood loss anemia  Postop Transfusion  Procedure: Procedure(s) (LRB): TOTAL HIP ARTHROPLASTY (Right)   Consults: None  HPI: [name] is a 71 y.o. female with end stage arthritis of her right hip with progressively worsening pain and dysfunction. Pain occurs with activity and rest including pain at night. She has tried analgesics, protected weight bearing and rest without benefit. Pain is too severe to attempt physical therapy. Radiographs demonstrate bone on bone arthritis with subchondral cyst formation. She has severe erosion of her femoral head and superior acetabulum with massive osteophytes and near ankylosis of the hip joint. She presents now for right THA.  Laboratory Data: Hospital Outpatient Visit on 01/08/2012  Component Date Value Range Status  . aPTT (seconds) 01/08/2012 30  24-37 Final  . WBC (K/uL) 01/08/2012 8.7  4.0-10.5 Final  . RBC (MIL/uL) 01/08/2012 4.65  3.87-5.11 Final  . Hemoglobin (g/dL) 01/08/2012 13.7  12.0-15.0 Final  . HCT (%) 01/08/2012 40.3  36.0-46.0 Final  . MCV (fL) 01/08/2012 86.7  78.0-100.0 Final  . MCH (pg) 01/08/2012 29.5  26.0-34.0 Final  . MCHC (g/dL) 01/08/2012 34.0  30.0-36.0 Final  . RDW (%) 01/08/2012 13.1  11.5-15.5 Final  . Platelets (K/uL) 01/08/2012 165  150-400 Final  . Sodium (mEq/L) 01/08/2012 142  135-145 Final  . Potassium (mEq/L) 01/08/2012 3.4* 3.5-5.1 Final  . Chloride (mEq/L) 01/08/2012 108  96-112 Final  . CO2  (mEq/L) 01/08/2012 23  19-32 Final  . Glucose, Bld (mg/dL) 01/08/2012 88  70-99 Final  . BUN (mg/dL) 01/08/2012 25* 6-23 Final  . Creatinine, Ser (mg/dL) 01/08/2012 0.83  0.50-1.10 Final  . Calcium (mg/dL) 01/08/2012 10.3  8.4-10.5 Final  . Total Protein (g/dL) 01/08/2012 6.8  6.0-8.3 Final  . Albumin (g/dL) 01/08/2012 4.0  3.5-5.2 Final  . AST (U/L) 01/08/2012 18  0-37 Final  . ALT (U/L) 01/08/2012 14  0-35 Final  . Alkaline Phosphatase (U/L) 01/08/2012 83  39-117 Final  . Total Bilirubin (mg/dL) 01/08/2012 0.4  0.3-1.2 Final  . GFR calc non Af Amer (mL/min) 01/08/2012 69* >90 Final  . GFR calc Af Amer (mL/min) 01/08/2012 80* >90 Final   Comment:                                 The eGFR has been calculated                          using the CKD EPI equation.                          This calculation has not been                          validated in all clinical  situations.                          eGFR's persistently                          <90 mL/min signify                          possible Chronic Kidney Disease.  Marland Kitchen Neutrophils Relative (%) 01/08/2012 76  43-77 Final  . Neutro Abs (K/uL) 01/08/2012 6.6  1.7-7.7 Final  . Lymphocytes Relative (%) 01/08/2012 17  12-46 Final  . Lymphs Abs (K/uL) 01/08/2012 1.4  0.7-4.0 Final  . Monocytes Relative (%) 01/08/2012 7  3-12 Final  . Monocytes Absolute (K/uL) 01/08/2012 0.6  0.1-1.0 Final  . Eosinophils Relative (%) 01/08/2012 0  0-5 Final  . Eosinophils Absolute (K/uL) 01/08/2012 0.0  0.0-0.7 Final  . Basophils Relative (%) 01/08/2012 0  0-1 Final  . Basophils Absolute (K/uL) 01/08/2012 0.0  0.0-0.1 Final  . Prothrombin Time (seconds) 01/08/2012 13.2  11.6-15.2 Final  . INR  01/08/2012 0.98  0.00-1.49 Final  . ABO/RH(D)  01/08/2012 B POS   Final  . Antibody Screen  01/08/2012 NEG   Final  . Sample Expiration  01/08/2012 01/18/2012   Final  . Unit Number  01/08/2012 [id number]   Final  . Blood Component Type   01/08/2012 RED CELLS,LR   Final  . Unit division  01/08/2012 00   Final  . Status of Unit  01/08/2012 ISSUED,FINAL   Final  . Transfusion Status  01/08/2012 OK TO TRANSFUSE   Final  . Crossmatch Result  01/08/2012 Compatible   Final  . Unit Number  01/08/2012 [id number]   Final  . Blood Component Type  01/08/2012 RED CELLS,LR   Final  . Unit division  01/08/2012 00   Final  . Status of Unit  01/08/2012 ISSUED,FINAL   Final  . Transfusion Status  01/08/2012 OK TO TRANSFUSE   Final  . Crossmatch Result  01/08/2012 Compatible   Final  . Color, Urine  01/08/2012 YELLOW  YELLOW Final  . APPearance  01/08/2012 CLEAR  CLEAR Final  . Specific Gravity, Urine  01/08/2012 1.014  1.005-1.030 Final  . pH  01/08/2012 6.0  5.0-8.0 Final  . Glucose, UA (mg/dL) 01/08/2012 NEGATIVE  NEGATIVE Final  . Hgb urine dipstick  01/08/2012 NEGATIVE  NEGATIVE Final  . Bilirubin Urine  01/08/2012 NEGATIVE  NEGATIVE Final  . Ketones, ur (mg/dL) 01/08/2012 NEGATIVE  NEGATIVE Final  . Protein, ur (mg/dL) 01/08/2012 NEGATIVE  NEGATIVE Final  . Urobilinogen, UA (mg/dL) 01/08/2012 0.2  0.0-1.0 Final  . Nitrite  01/08/2012 NEGATIVE  NEGATIVE Final  . Leukocytes, UA  01/08/2012 TRACE* NEGATIVE Final  . MRSA, PCR  01/08/2012 NEGATIVE  NEGATIVE Final  . Staphylococcus aureus  01/08/2012 NEGATIVE  NEGATIVE Final   Comment:                                 The Xpert SA Assay (FDA                          approved for NASAL specimens                          only), is one  component of                          a comprehensive surveillance                          program.  It is not intended                          to diagnose infection nor to                          guide or monitor treatment.  . Squamous Epithelial / LPF  01/08/2012 RARE  RARE Final  . WBC, UA (WBC/hpf) 01/08/2012 0-2  <3 Final  . RBC / HPF (RBC/hpf) 01/08/2012 0-2  <3 Final  . Bacteria, UA  01/08/2012 FEW* RARE Final    Basename 01/19/12 0806  01/18/12 0339 01/17/12 1720  HGB 9.0* 8.8* 9.6*    Basename 01/19/12 0806 01/18/12 0339  WBC 7.5 9.0  RBC 3.06* 3.00*  HCT 26.6* 25.7*  PLT 127* 105*    Basename 01/18/12 0339  NA 135  K 3.8  CL 105  CO2 25  BUN 15  CREATININE 0.84  GLUCOSE 106*  CALCIUM 8.6   No results found for this basename: LABPT:2,INR:2 in the last 72 hours  X-Rays:Dg Chest 2 View  01/08/2012  *RADIOLOGY REPORT*  Clinical Data: A preoperative study for upcoming right total hip arthroplasty.  No current chest complaints.  Nonsmoker.  CHEST - 2 VIEW  Comparison: Chest x-ray to 07/26/2011.  Findings: Lung volumes are normal.  No consolidative airspace disease.  No pleural effusions.  No pneumothorax.  No pulmonary nodule or mass noted.  Pulmonary vasculature and the cardiomediastinal silhouette are within normal limits. Atherosclerotic calcifications noted in the arch of the aorta.  Orthopedic fixation hardware is noted in the lower cervical spine (incompletely visualized).  IMPRESSION: No radiographic evidence of acute cardiopulmonary disease. Atherosclerosis.  Original Report Authenticated By: Etheleen Mayhew, M.D.   Dg Pelvis Portable  01/15/2012  *RADIOLOGY REPORT*  Clinical Data: Post right hip surgery  PORTABLE PELVIS  Comparison: Portable exam 1739 hours compared to 12/25/2010  Findings: Interval placement of acetabular and femoral components of a right hip prosthesis. Tip of femoral component not visualized. No fracture or acute complication seen. Pelvis appears intact. Prior L4-L5 fusion. Severe osseous demineralization. Severe osteoarthritic changes of left hip with joint space obliteration, destruction of left femoral head, extensive spurring, and superolateral subluxation of femur. Surgical drain noted at surgical bed right hip.  IMPRESSION: Right hip prosthesis without acute complication. Severe osteoarthritic changes of left hip joint.  Original Report Authenticated By: Burnetta Sabin, M.D.   Dg Hip  Portable 1 View Right  01/15/2012  *RADIOLOGY REPORT*  Clinical Data: Post right hip surgery  PORTABLE RIGHT HIP - 1 VIEW  Comparison: Portable exam 1740 hours compared to 12/25/2010  Findings: Acetabular and femoral components of a right hip prosthesis are identified in expected positions. Surgical drain present. Expected postsurgical soft tissue changes. No acute fracture or dislocation. Visualized pelvis intact. No acute abnormality identified.  IMPRESSION: Right hip prosthesis without acute abnormality.  Original Report Authenticated By: Burnetta Sabin, M.D.    EKG: Orders placed in visit on 01/08/12  . EKG 12-LEAD     Hospital Course: Patient was admitted to [facility] and taken  to the OR and underwent the above state procedure without complications.  Patient tolerated the procedure well and was later transferred to the recovery room and then to the orthopaedic floor for postoperative care.  They were given PO and IV analgesics for pain control following their surgery.  They were given 24 hours of postoperative antibiotics and started on DVT prophylaxis.   PT and OT were ordered for total joint protocol.  Discharge planning consulted to help with postop disposition and equipment needs.  Patient had a tough night on the evening of surgery with pain and nausea but started to get up with therapy on day one.  She had some low blood pressures requiring fluid boluses.  Hemovac drain was pulled without difficulty. Day two, her HGB was noted to have dropped down to 6.7 and required blood.  She was unable to do much therapy on day 2. There was a remote chance that the patient would be able to go to SNF on Saturday but due to the low HGB and the lack of progress, it was felt  That the patient would not be ready so she remained in house through the weekend.   Dressing was changed on day two and the incision was healing well.  By day three, the patient's HGB was back up to 9.6.  Incision was healing well.  Started to progress with therapy.  Seen over the weekend by the covering staff each day.  She was reevaluated by Dr. Wynelle Link on Monday and it was felt that the patient was ready to go to SNF at that time.  Transfer over to [facility] as long as the bed was ready.  Discharge Medications: Prior to Admission medications   Medication Sig Start Date End Date Taking? Authorizing Provider  amLODipine (NORVASC) 10 MG tablet Take 10 mg by mouth daily.  12/04/11  Yes Elby Showers, MD  atenolol (TENORMIN) 50 MG tablet Take 50 mg by mouth daily.  12/31/11  Yes Elby Showers, MD  levothyroxine (SYNTHROID) 112 MCG tablet Take 112 mcg by mouth daily.  10/02/11  Yes Elby Showers, MD  Pancrelipase, Lip-Prot-Amyl, (ZENPEP) 10000 UNITS CPEP Take 1-4 capsules by mouth 3 (three) times daily with meals.   Yes Historical Provider, MD  sertraline (ZOLOFT) 50 MG tablet Take 50 mg by mouth daily.  06/11/11  Yes Elby Showers, MD  acetaminophen (TYLENOL) 325 MG tablet Take 2 tablets (650 mg total) by mouth every 6 (six) hours as needed (or Fever >/= 101). 01/20/12 01/19/13  Alexzandrew Perkins, PA  allopurinol (ZYLOPRIM) 100 MG tablet Take 100 mg by mouth daily.  06/11/11   Elby Showers, MD  bisacodyl (DULCOLAX) 10 MG suppository Place 1 suppository (10 mg total) rectally daily as needed. 01/20/12 01/30/12  Alexzandrew Perkins, PA  docusate sodium 100 MG CAPS Take 100 mg by mouth 2 (two) times daily. 01/20/12 01/30/12  Alexzandrew Perkins, PA  methocarbamol (ROBAXIN) 500 MG tablet Take 1 tablet (500 mg total) by mouth every 6 (six) hours as needed. 01/20/12 01/30/12  Alexzandrew Perkins, PA  ondansetron (ZOFRAN) 4 MG tablet Take 1 tablet (4 mg total) by mouth every 6 (six) hours as needed for nausea. 01/20/12 01/27/12  Alexzandrew Perkins, PA  oxyCODONE (OXY IR/ROXICODONE) 5 MG immediate release tablet Take 1-2 tablets (5-10 mg total) by mouth every 3 (three) hours as needed. 01/20/12 01/30/12  Alexzandrew Perkins, PA  polyethylene glycol (MIRALAX  / GLYCOLAX) packet Take 17 g by mouth daily as needed. 01/20/12  01/23/12  Alexzandrew Dara Lords, PA  polysaccharide iron (NIFEREX) 150 MG CAPS capsule Take 1 capsule (150 mg total) by mouth daily. Take for three weeks and then discontinue. 01/20/12   Alexzandrew Dara Lords, PA  rivaroxaban (XARELTO) 10 MG TABS tablet Take 1 tablet (10 mg total) by mouth daily with breakfast. Take for three weeks and then discontinue the Xarelto. 01/20/12   Alexzandrew Dara Lords, PA   Diet: heart healthy Activity:PWB 25-50% Right Hip No bending hip over 90 degrees- A "L" Angle Do not cross legs Do not let foot roll inward  When turning these patients a pillow should be placed between the patient's legs to prevent crossing.  Patients should have the affected knee fully extended when trying to sit or stand from all surfaces to prevent excessive hip flexion.  When ambulating and turning toward the affected side the affected leg should have the toes turned out prior to moving the walker and the rest of patient's body as to prevent internal rotation/ turning in of the leg.  Abduction pillows are the most effective way to prevent a patient from not crossing legs or turning toes in at rest. If an abduction pillow is not ordered placing a regular pillow length wise between the patient's legs is also an effective reminder.  It is imperative that these precautions be maintained so that the surgical hip does not dislocate.  Follow-up:in 2 weeks from surgery Disposition: [place] Discharged Condition: good  Discharge Orders    Future Appointments: Provider: Department: Dept Phone: Center:   02/07/2012 9:05 AM Elby Showers, MD [facility] [phone] [facility]   02/10/2012 11:00 AM Elby Showers, MD [facility] [phone] [facility]     Future Orders Please Complete By Expires   Diet - low sodium heart healthy      Call MD / Call 911      Comments:   If you experience chest pain or shortness of breath, CALL 911 and be  transported to the hospital emergency room.  If you develope a fever above 101 F, pus (white drainage) or increased drainage or redness at the wound, or calf pain, call your surgeon's office.   Constipation Prevention      Comments:   Drink plenty of fluids.  Prune juice may be helpful.  You may use a stool softener, such as Colace (over the counter) 100 mg twice a day.  Use MiraLax (over the counter) for constipation as needed.   Increase activity slowly as tolerated      Weight Bearing as taught in Physical Therapy      Comments:   Use a walker or crutches as instructed.   Discharge instructions      Comments:   Pick up stool softner and laxative for home. Do not submerge incision under water. May shower. Continue to use ice for pain and swelling from surgery. Hip precautions.  Total Hip Protocol.   Driving restrictions      Comments:   No driving   Lifting restrictions      Comments:   No lifting   Follow the hip precautions as taught in Physical Therapy      Change dressing      Comments:   You may change your dressing daily with sterile 4 x 4 inch gauze dressing and paper tape.   TED hose      Comments:   Use stockings (TED hose) for 3 weeks on both leg(s).  You may remove them at night  for sleeping.     Medication List  As of 01/20/2012  8:00 AM   TAKE these medications         acetaminophen 325 MG tablet   Commonly known as: TYLENOL   Take 2 tablets (650 mg total) by mouth every 6 (six) hours as needed (or Fever >/= 101).      allopurinol 100 MG tablet   Commonly known as: ZYLOPRIM   Take 100 mg by mouth daily.      amLODipine 10 MG tablet   Commonly known as: NORVASC   Take 10 mg by mouth daily.      atenolol 50 MG tablet   Commonly known as: TENORMIN   Take 50 mg by mouth daily.      bisacodyl 10 MG suppository   Commonly known as: DULCOLAX   Place 1 suppository (10 mg total) rectally daily as needed.      DSS 100 MG Caps   Take 100 mg by mouth 2 (two)  times daily.      methocarbamol 500 MG tablet   Commonly known as: ROBAXIN   Take 1 tablet (500 mg total) by mouth every 6 (six) hours as needed.      ondansetron 4 MG tablet   Commonly known as: ZOFRAN   Take 1 tablet (4 mg total) by mouth every 6 (six) hours as needed for nausea.      oxyCODONE 5 MG immediate release tablet   Commonly known as: Oxy IR/ROXICODONE   Take 1-2 tablets (5-10 mg total) by mouth every 3 (three) hours as needed.      polyethylene glycol packet   Commonly known as: MIRALAX / GLYCOLAX   Take 17 g by mouth daily as needed.      polysaccharide iron 150 MG Caps capsule   Commonly known as: NIFEREX   Take 1 capsule (150 mg total) by mouth daily. Take for three weeks and then discontinue.      rivaroxaban 10 MG Tabs tablet   Commonly known as: XARELTO   Take 1 tablet (10 mg total) by mouth daily with breakfast. Take for three weeks and then discontinue the Xarelto.      sertraline 50 MG tablet   Commonly known as: ZOLOFT   Take 50 mg by mouth daily.      SYNTHROID 112 MCG tablet   Generic drug: levothyroxine   Take 112 mcg by mouth daily.      ZENPEP 10000 UNITS Cpep   Generic drug: Pancrelipase (Lip-Prot-Amyl)   Take 1-4 capsules by mouth 3 (three) times daily with meals.           Follow-up Information    Follow up with Gearlean Alf, MD. Schedule an appointment as soon as possible for a visit in 2 weeks. (Please have SNF help make follow up appointment and arrange transportation for patient.)    Contact information:   [facility] [address] [place] [id number]          Signed: Mickel Crow 01/20/2012, 8:00 AM

## 2012-01-20 NOTE — Progress Notes (Signed)
Pt planning to have her ST rehab at [facility]. CSW will assist with d/c planning to SNF today.

## 2012-01-20 NOTE — Progress Notes (Signed)
Physical Therapy Treatment Patient Details Name: [name] MRN: [id number] DOB: 22-Jan-1941 Today's Date: 01/20/2012  R THA POD #5 13:50 - 14:20 1 gt  1 ta   PT Assessment/Plan  PT - Assessment/Plan Comments on Treatment Session: Pt plans to D/C to SNF today, [facility]. PT Plan: Discharge plan remains appropriate Follow Up Recommendations: Skilled nursing facility Equipment Recommended: Defer to next venue PT Goals  Acute Rehab PT Goals PT Goal Formulation: With patient Pt will go Supine/Side to Sit: with modified independence PT Goal: Supine/Side to Sit - Progress: Progressing toward goal Pt will go Sit to Supine/Side: with modified independence PT Goal: Sit to Supine/Side - Progress: Progressing toward goal Pt will go Sit to Stand: with modified independence PT Goal: Sit to Stand - Progress: Progressing toward goal Pt will go Stand to Sit: with modified independence PT Goal: Stand to Sit - Progress: Progressing toward goal Pt will Ambulate: >150 feet;with supervision;with rolling walker PT Goal: Ambulate - Progress: Progressing toward goal Pt will Perform Home Exercise Program: with supervision, verbal cues required/provided PT Goal: Perform Home Exercise Program - Progress: Progressing toward goal  PT Treatment Precautions/Restrictions  Precautions Precautions: Posterior Hip Precaution Comments: Pt able to recall 2/3 hip precautions and PWB status.  Required Braces or Orthoses: No Restrictions Weight Bearing Restrictions: Yes RLE Weight Bearing: Partial weight bearing RLE Partial Weight Bearing Percentage or Pounds: Pt aware she is PWB Mobility (including Balance) Bed Mobility Bed Mobility: Yes Supine to Sit: 4: Min assist Supine to Sit Details (indicate cue type and reason): pt used a sheet to assist R LE off bed Sitting - Scoot to Edge of Bed: 4: Min assist Sitting - Scoot to [place] of Bed Details (indicate cue type and reason): Mod assist to support R LE up in  bed Sit to Supine: 4: Min assist Sit to Supine - Details (indicate cue type and reason): mod assist to suppoer R LE up in bed Transfers Transfers: Yes Sit to Stand: 4: Min assist;From bed Sit to Stand Details (indicate cue type and reason): 25% VC's to extend R LE and hand placement prior to stand Stand to Sit: 4: Min assist;To bed Stand to Sit Details: 25% VC's to extend R LE and hand placement prior to sit Ambulation/Gait Ambulation/Gait: Yes Ambulation/Gait Assistance: 5: Supervision Ambulation/Gait Assistance Details (indicate cue type and reason): increased time and <25% VC's on safety with turns and backward gait Ambulation Distance (Feet): 55 Feet Assistive device: Rolling walker Gait Pattern: Step-to pattern;Decreased stance time - right;Trunk flexed Stairs: No Wheelchair Mobility Wheelchair Mobility: No    Exercise    End of Session PT - End of Session Equipment Utilized During Treatment: Gait belt Activity Tolerance: Patient tolerated treatment well Patient left: in bed General Behavior During Session: [facility] for tasks performed Cognition: [facility] for tasks performed  Rica Koyanagi  PTA [facility]  Acute  Rehab Pager     [phone]

## 2012-01-22 DIAGNOSIS — E7800 Pure hypercholesterolemia, unspecified: Secondary | ICD-10-CM | POA: Diagnosis not present

## 2012-01-22 DIAGNOSIS — M1610 Unilateral primary osteoarthritis, unspecified hip: Secondary | ICD-10-CM | POA: Diagnosis not present

## 2012-01-22 DIAGNOSIS — I10 Essential (primary) hypertension: Secondary | ICD-10-CM | POA: Diagnosis not present

## 2012-01-22 DIAGNOSIS — D62 Acute posthemorrhagic anemia: Secondary | ICD-10-CM | POA: Diagnosis not present

## 2012-01-22 DIAGNOSIS — E039 Hypothyroidism, unspecified: Secondary | ICD-10-CM | POA: Diagnosis not present

## 2012-02-07 ENCOUNTER — Other Ambulatory Visit: Payer: Medicare Other | Admitting: Internal Medicine

## 2012-02-10 ENCOUNTER — Encounter: Payer: Medicare Other | Admitting: Internal Medicine

## 2012-02-19 DIAGNOSIS — I129 Hypertensive chronic kidney disease with stage 1 through stage 4 chronic kidney disease, or unspecified chronic kidney disease: Secondary | ICD-10-CM | POA: Diagnosis not present

## 2012-02-19 DIAGNOSIS — F329 Major depressive disorder, single episode, unspecified: Secondary | ICD-10-CM | POA: Diagnosis not present

## 2012-02-19 DIAGNOSIS — Z96649 Presence of unspecified artificial hip joint: Secondary | ICD-10-CM | POA: Diagnosis not present

## 2012-02-19 DIAGNOSIS — Z5189 Encounter for other specified aftercare: Secondary | ICD-10-CM | POA: Diagnosis not present

## 2012-02-19 DIAGNOSIS — M169 Osteoarthritis of hip, unspecified: Secondary | ICD-10-CM | POA: Diagnosis not present

## 2012-02-19 DIAGNOSIS — Z471 Aftercare following joint replacement surgery: Secondary | ICD-10-CM | POA: Diagnosis not present

## 2012-02-21 DIAGNOSIS — Z5189 Encounter for other specified aftercare: Secondary | ICD-10-CM | POA: Diagnosis not present

## 2012-02-21 DIAGNOSIS — Z471 Aftercare following joint replacement surgery: Secondary | ICD-10-CM | POA: Diagnosis not present

## 2012-02-21 DIAGNOSIS — I129 Hypertensive chronic kidney disease with stage 1 through stage 4 chronic kidney disease, or unspecified chronic kidney disease: Secondary | ICD-10-CM | POA: Diagnosis not present

## 2012-02-21 DIAGNOSIS — F329 Major depressive disorder, single episode, unspecified: Secondary | ICD-10-CM | POA: Diagnosis not present

## 2012-02-21 DIAGNOSIS — Z96649 Presence of unspecified artificial hip joint: Secondary | ICD-10-CM | POA: Diagnosis not present

## 2012-02-24 DIAGNOSIS — Z96649 Presence of unspecified artificial hip joint: Secondary | ICD-10-CM | POA: Diagnosis not present

## 2012-02-24 DIAGNOSIS — Z471 Aftercare following joint replacement surgery: Secondary | ICD-10-CM | POA: Diagnosis not present

## 2012-02-24 DIAGNOSIS — F329 Major depressive disorder, single episode, unspecified: Secondary | ICD-10-CM | POA: Diagnosis not present

## 2012-02-24 DIAGNOSIS — Z5189 Encounter for other specified aftercare: Secondary | ICD-10-CM | POA: Diagnosis not present

## 2012-02-24 DIAGNOSIS — I129 Hypertensive chronic kidney disease with stage 1 through stage 4 chronic kidney disease, or unspecified chronic kidney disease: Secondary | ICD-10-CM | POA: Diagnosis not present

## 2012-02-26 DIAGNOSIS — I129 Hypertensive chronic kidney disease with stage 1 through stage 4 chronic kidney disease, or unspecified chronic kidney disease: Secondary | ICD-10-CM | POA: Diagnosis not present

## 2012-02-26 DIAGNOSIS — F329 Major depressive disorder, single episode, unspecified: Secondary | ICD-10-CM | POA: Diagnosis not present

## 2012-02-26 DIAGNOSIS — Z5189 Encounter for other specified aftercare: Secondary | ICD-10-CM | POA: Diagnosis not present

## 2012-02-26 DIAGNOSIS — Z471 Aftercare following joint replacement surgery: Secondary | ICD-10-CM | POA: Diagnosis not present

## 2012-02-26 DIAGNOSIS — Z96649 Presence of unspecified artificial hip joint: Secondary | ICD-10-CM | POA: Diagnosis not present

## 2012-02-28 DIAGNOSIS — Z471 Aftercare following joint replacement surgery: Secondary | ICD-10-CM | POA: Diagnosis not present

## 2012-02-28 DIAGNOSIS — I129 Hypertensive chronic kidney disease with stage 1 through stage 4 chronic kidney disease, or unspecified chronic kidney disease: Secondary | ICD-10-CM | POA: Diagnosis not present

## 2012-02-28 DIAGNOSIS — Z96649 Presence of unspecified artificial hip joint: Secondary | ICD-10-CM | POA: Diagnosis not present

## 2012-02-28 DIAGNOSIS — F329 Major depressive disorder, single episode, unspecified: Secondary | ICD-10-CM | POA: Diagnosis not present

## 2012-02-28 DIAGNOSIS — Z5189 Encounter for other specified aftercare: Secondary | ICD-10-CM | POA: Diagnosis not present

## 2012-03-01 ENCOUNTER — Other Ambulatory Visit: Payer: Self-pay | Admitting: Orthopedic Surgery

## 2012-03-02 DIAGNOSIS — F329 Major depressive disorder, single episode, unspecified: Secondary | ICD-10-CM | POA: Diagnosis not present

## 2012-03-02 DIAGNOSIS — Z96649 Presence of unspecified artificial hip joint: Secondary | ICD-10-CM | POA: Diagnosis not present

## 2012-03-02 DIAGNOSIS — Z5189 Encounter for other specified aftercare: Secondary | ICD-10-CM | POA: Diagnosis not present

## 2012-03-02 DIAGNOSIS — Z471 Aftercare following joint replacement surgery: Secondary | ICD-10-CM | POA: Diagnosis not present

## 2012-03-02 DIAGNOSIS — I129 Hypertensive chronic kidney disease with stage 1 through stage 4 chronic kidney disease, or unspecified chronic kidney disease: Secondary | ICD-10-CM | POA: Diagnosis not present

## 2012-03-04 DIAGNOSIS — F329 Major depressive disorder, single episode, unspecified: Secondary | ICD-10-CM | POA: Diagnosis not present

## 2012-03-04 DIAGNOSIS — Z96649 Presence of unspecified artificial hip joint: Secondary | ICD-10-CM | POA: Diagnosis not present

## 2012-03-04 DIAGNOSIS — I129 Hypertensive chronic kidney disease with stage 1 through stage 4 chronic kidney disease, or unspecified chronic kidney disease: Secondary | ICD-10-CM | POA: Diagnosis not present

## 2012-03-04 DIAGNOSIS — Z471 Aftercare following joint replacement surgery: Secondary | ICD-10-CM | POA: Diagnosis not present

## 2012-03-04 DIAGNOSIS — Z5189 Encounter for other specified aftercare: Secondary | ICD-10-CM | POA: Diagnosis not present

## 2012-03-06 DIAGNOSIS — Z5189 Encounter for other specified aftercare: Secondary | ICD-10-CM | POA: Diagnosis not present

## 2012-03-06 DIAGNOSIS — Z96649 Presence of unspecified artificial hip joint: Secondary | ICD-10-CM | POA: Diagnosis not present

## 2012-03-06 DIAGNOSIS — Z471 Aftercare following joint replacement surgery: Secondary | ICD-10-CM | POA: Diagnosis not present

## 2012-03-06 DIAGNOSIS — F329 Major depressive disorder, single episode, unspecified: Secondary | ICD-10-CM | POA: Diagnosis not present

## 2012-03-06 DIAGNOSIS — I129 Hypertensive chronic kidney disease with stage 1 through stage 4 chronic kidney disease, or unspecified chronic kidney disease: Secondary | ICD-10-CM | POA: Diagnosis not present

## 2012-03-10 DIAGNOSIS — Z5189 Encounter for other specified aftercare: Secondary | ICD-10-CM | POA: Diagnosis not present

## 2012-03-10 DIAGNOSIS — I129 Hypertensive chronic kidney disease with stage 1 through stage 4 chronic kidney disease, or unspecified chronic kidney disease: Secondary | ICD-10-CM | POA: Diagnosis not present

## 2012-03-10 DIAGNOSIS — Z471 Aftercare following joint replacement surgery: Secondary | ICD-10-CM | POA: Diagnosis not present

## 2012-03-10 DIAGNOSIS — Z96649 Presence of unspecified artificial hip joint: Secondary | ICD-10-CM | POA: Diagnosis not present

## 2012-03-10 DIAGNOSIS — F329 Major depressive disorder, single episode, unspecified: Secondary | ICD-10-CM | POA: Diagnosis not present

## 2012-03-13 DIAGNOSIS — Z471 Aftercare following joint replacement surgery: Secondary | ICD-10-CM | POA: Diagnosis not present

## 2012-03-13 DIAGNOSIS — Z5189 Encounter for other specified aftercare: Secondary | ICD-10-CM | POA: Diagnosis not present

## 2012-03-13 DIAGNOSIS — F329 Major depressive disorder, single episode, unspecified: Secondary | ICD-10-CM | POA: Diagnosis not present

## 2012-03-13 DIAGNOSIS — Z96649 Presence of unspecified artificial hip joint: Secondary | ICD-10-CM | POA: Diagnosis not present

## 2012-03-13 DIAGNOSIS — I129 Hypertensive chronic kidney disease with stage 1 through stage 4 chronic kidney disease, or unspecified chronic kidney disease: Secondary | ICD-10-CM | POA: Diagnosis not present

## 2012-03-17 DIAGNOSIS — Z5189 Encounter for other specified aftercare: Secondary | ICD-10-CM | POA: Diagnosis not present

## 2012-03-17 DIAGNOSIS — I129 Hypertensive chronic kidney disease with stage 1 through stage 4 chronic kidney disease, or unspecified chronic kidney disease: Secondary | ICD-10-CM | POA: Diagnosis not present

## 2012-03-17 DIAGNOSIS — Z471 Aftercare following joint replacement surgery: Secondary | ICD-10-CM | POA: Diagnosis not present

## 2012-03-17 DIAGNOSIS — Z96649 Presence of unspecified artificial hip joint: Secondary | ICD-10-CM | POA: Diagnosis not present

## 2012-03-17 DIAGNOSIS — F329 Major depressive disorder, single episode, unspecified: Secondary | ICD-10-CM | POA: Diagnosis not present

## 2012-03-19 DIAGNOSIS — I129 Hypertensive chronic kidney disease with stage 1 through stage 4 chronic kidney disease, or unspecified chronic kidney disease: Secondary | ICD-10-CM | POA: Diagnosis not present

## 2012-03-19 DIAGNOSIS — F329 Major depressive disorder, single episode, unspecified: Secondary | ICD-10-CM | POA: Diagnosis not present

## 2012-03-19 DIAGNOSIS — Z5189 Encounter for other specified aftercare: Secondary | ICD-10-CM | POA: Diagnosis not present

## 2012-03-19 DIAGNOSIS — Z471 Aftercare following joint replacement surgery: Secondary | ICD-10-CM | POA: Diagnosis not present

## 2012-03-19 DIAGNOSIS — Z96649 Presence of unspecified artificial hip joint: Secondary | ICD-10-CM | POA: Diagnosis not present

## 2012-03-24 ENCOUNTER — Other Ambulatory Visit: Payer: Medicare Other | Admitting: Internal Medicine

## 2012-03-24 DIAGNOSIS — Z5189 Encounter for other specified aftercare: Secondary | ICD-10-CM | POA: Diagnosis not present

## 2012-03-24 DIAGNOSIS — Z96649 Presence of unspecified artificial hip joint: Secondary | ICD-10-CM | POA: Diagnosis not present

## 2012-03-24 DIAGNOSIS — F329 Major depressive disorder, single episode, unspecified: Secondary | ICD-10-CM | POA: Diagnosis not present

## 2012-03-24 DIAGNOSIS — Z471 Aftercare following joint replacement surgery: Secondary | ICD-10-CM | POA: Diagnosis not present

## 2012-03-24 DIAGNOSIS — I129 Hypertensive chronic kidney disease with stage 1 through stage 4 chronic kidney disease, or unspecified chronic kidney disease: Secondary | ICD-10-CM | POA: Diagnosis not present

## 2012-03-26 ENCOUNTER — Encounter: Payer: Medicare Other | Admitting: Internal Medicine

## 2012-03-26 DIAGNOSIS — Z96649 Presence of unspecified artificial hip joint: Secondary | ICD-10-CM | POA: Diagnosis not present

## 2012-03-26 DIAGNOSIS — I129 Hypertensive chronic kidney disease with stage 1 through stage 4 chronic kidney disease, or unspecified chronic kidney disease: Secondary | ICD-10-CM | POA: Diagnosis not present

## 2012-03-26 DIAGNOSIS — Z471 Aftercare following joint replacement surgery: Secondary | ICD-10-CM | POA: Diagnosis not present

## 2012-03-26 DIAGNOSIS — F329 Major depressive disorder, single episode, unspecified: Secondary | ICD-10-CM | POA: Diagnosis not present

## 2012-03-26 DIAGNOSIS — Z5189 Encounter for other specified aftercare: Secondary | ICD-10-CM | POA: Diagnosis not present

## 2012-03-31 DIAGNOSIS — Z471 Aftercare following joint replacement surgery: Secondary | ICD-10-CM | POA: Diagnosis not present

## 2012-03-31 DIAGNOSIS — F329 Major depressive disorder, single episode, unspecified: Secondary | ICD-10-CM | POA: Diagnosis not present

## 2012-03-31 DIAGNOSIS — Z96649 Presence of unspecified artificial hip joint: Secondary | ICD-10-CM | POA: Diagnosis not present

## 2012-03-31 DIAGNOSIS — Z5189 Encounter for other specified aftercare: Secondary | ICD-10-CM | POA: Diagnosis not present

## 2012-03-31 DIAGNOSIS — H2510 Age-related nuclear cataract, unspecified eye: Secondary | ICD-10-CM | POA: Diagnosis not present

## 2012-03-31 DIAGNOSIS — I129 Hypertensive chronic kidney disease with stage 1 through stage 4 chronic kidney disease, or unspecified chronic kidney disease: Secondary | ICD-10-CM | POA: Diagnosis not present

## 2012-04-02 DIAGNOSIS — Z96649 Presence of unspecified artificial hip joint: Secondary | ICD-10-CM | POA: Diagnosis not present

## 2012-04-02 DIAGNOSIS — I129 Hypertensive chronic kidney disease with stage 1 through stage 4 chronic kidney disease, or unspecified chronic kidney disease: Secondary | ICD-10-CM | POA: Diagnosis not present

## 2012-04-02 DIAGNOSIS — Z471 Aftercare following joint replacement surgery: Secondary | ICD-10-CM | POA: Diagnosis not present

## 2012-04-02 DIAGNOSIS — Z5189 Encounter for other specified aftercare: Secondary | ICD-10-CM | POA: Diagnosis not present

## 2012-04-02 DIAGNOSIS — F329 Major depressive disorder, single episode, unspecified: Secondary | ICD-10-CM | POA: Diagnosis not present

## 2012-04-03 ENCOUNTER — Other Ambulatory Visit (INDEPENDENT_AMBULATORY_CARE_PROVIDER_SITE_OTHER): Payer: Self-pay | Admitting: General Surgery

## 2012-04-07 DIAGNOSIS — F329 Major depressive disorder, single episode, unspecified: Secondary | ICD-10-CM | POA: Diagnosis not present

## 2012-04-07 DIAGNOSIS — Z96649 Presence of unspecified artificial hip joint: Secondary | ICD-10-CM | POA: Diagnosis not present

## 2012-04-07 DIAGNOSIS — Z5189 Encounter for other specified aftercare: Secondary | ICD-10-CM | POA: Diagnosis not present

## 2012-04-07 DIAGNOSIS — Z471 Aftercare following joint replacement surgery: Secondary | ICD-10-CM | POA: Diagnosis not present

## 2012-04-07 DIAGNOSIS — I129 Hypertensive chronic kidney disease with stage 1 through stage 4 chronic kidney disease, or unspecified chronic kidney disease: Secondary | ICD-10-CM | POA: Diagnosis not present

## 2012-04-13 ENCOUNTER — Other Ambulatory Visit (INDEPENDENT_AMBULATORY_CARE_PROVIDER_SITE_OTHER): Payer: Self-pay

## 2012-05-01 DIAGNOSIS — M169 Osteoarthritis of hip, unspecified: Secondary | ICD-10-CM | POA: Diagnosis not present

## 2012-05-03 ENCOUNTER — Other Ambulatory Visit: Payer: Self-pay | Admitting: Orthopedic Surgery

## 2012-05-03 NOTE — H&P (Signed)
[name]  DOB: 1941-02-14 Single / Language: English / Race: White Female  Date of Admission:  05/22/2012  Chief Complaint:  Left Hip Pain  History of Present Illness The patient is a 71 year old female who comes in for a preoperative History and Physical. The patient is scheduled for a left total hip arthroplasty to be performed by Dr. Dione Plover. Aluisio, MD at [facility] on 05/22/2012. The patient is a 71 year old female who is post-operative from right total hip arthroplasty. The patient states that she is doing very well at this time. The pain is under excellent control at this time and describes their pain as very little pain or discomfort. They are currently on no medication for their pain. She is anxious and ready to get her left hip done. She is really pleased with the right hip. It has already made a big difference in her life. She is now ready to proceed with the Left Hip Replacement. They have been treated conservatively in the past for the above stated problem and despite conservative measures, they continue to have progressive pain and severe functional limitations and dysfunction. They have failed non-operative management. It is felt that they would benefit from undergoing total joint replacement. Risks and benefits of the procedure have been discussed with the patient and they elect to proceed with surgery. There are no active contraindications to surgery such as ongoing infection or rapidly progressive neurological disease.   Problem List/Past Medical Osteoarthritis, Hip (715.35) Gout Osteoarthritis High blood pressure Hypercholesterolemia. Diet Controlled Cataract Mitral Valve Prolapse Thyroid Nodule Depression Pancreatic Adenoma. S/P Resection   Allergies Codeine Phosphate *ANALGESICS - OPIOID*. synthetics are fine   Family History Cancer. brother Father. Deceased, Myocardial infarction. age 26 Mother. Deceased, Dementia. age  10   Social History Marital status. widowed Tobacco use. Former smoker. only smoked while in her 4s former smoker; smoke(d) less than 1/2 pack(s) per day Tobacco / smoke exposure. no Children. 0 Current work status. 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 current drinker; drinks beer and wine; only occasionally per week Living situation. live with caregiver Number of flights of stairs before winded. less than 1 Pain Contract. no Drug/Alcohol Rehab (Previously). no Exercise. Exercises daily; does other Illicit drug use. no Post-Surgical Plans. Patient wants to go home but did go to [place] after the previous surgery so we will seee how she progresses.   Medication History AmLODIPine Besylate (10MG  Tablet, Oral) Active. Allopurinol (100MG  Tablet, Oral) Active. Atenolol (50MG  Tablet, Oral) Active. Sertraline HCl (50MG  Tablet, Oral) Active. Synthroid (112MCG Tablet, Oral) Active. Zenpep (10000UNIT Capsule DR Part, Oral) Active.   Past Surgical History Colon Polyp Removal - Colonoscopy Neck Disc Surgery Spinal Fusion. lower back Exploratory Laporotomy - Removal Partial Pancreas, Partial Stomach, Gallbaldder. Date: 12/2010. Total Hip Replacement - Right. Date: 01/15/2012.  Review of Systems General:Not Present- Chills, Fever, Night Sweats, Fatigue, Weight Gain, Weight Loss and Memory Loss. Skin:Not Present- Hives, Itching, Rash, Eczema and Lesions. HEENT:Not Present- Tinnitus, Headache, Double Vision, Visual Loss, Hearing Loss and Dentures. Respiratory:Not Present- Shortness of breath with exertion, Shortness of breath at rest, Allergies, Coughing up blood and Chronic Cough. Cardiovascular:Not Present- Chest Pain, Racing/skipping heartbeats, Difficulty Breathing Lying Down, Murmur, Swelling and Palpitations. Gastrointestinal:Not Present- Bloody Stool, Heartburn, Abdominal Pain, Vomiting, Nausea, Constipation, Diarrhea, Difficulty  Swallowing, Jaundice and Loss of appetitie. Female Genitourinary:Not Present- Blood in Urine, Urinary frequency, Weak urinary stream, Discharge, Flank Pain, Incontinence, Painful Urination, Urgency, Urinary Retention and Urinating at  Night. Musculoskeletal:Not Present- Muscle Weakness, Muscle Pain, Joint Swelling, Joint Pain, Back Pain, Morning Stiffness and Spasms. Neurological:Not Present- Tremor, Dizziness, Blackout spells, Paralysis, Difficulty with balance and Weakness. Psychiatric:Not Present- Insomnia.   Vitals Weight: 126 lb Height: 64 in Body Surface Area: 1.61 m Body Mass Index: 21.63 kg/m Pulse: 64 (Regular) Resp.: 12 (Unlabored) BP: 142/72 (Sitting, Right Arm, Standard)    Physical Exam The physical exam findings are as follows:   General Mental Status - Alert, cooperative and good historian. General Appearance- pleasant. Not in acute distress. Orientation- Oriented X3. Build & Nutrition- Well nourished and Well developed.   Head and Neck Head- normocephalic, atraumatic . Neck Global Assessment- supple. no bruit auscultated on the right and no bruit auscultated on the left.   Eye Pupil- Bilateral- Regular and Round. Motion- Bilateral- EOMI.   Chest and Lung Exam Auscultation: Breath sounds:- clear at anterior chest wall and - clear at posterior chest wall. Adventitious sounds:- No Adventitious sounds.   Cardiovascular Auscultation:Rhythm- Regular rate and rhythm. Heart Sounds- S1 WNL and S2 WNL. Murmurs & Other Heart Sounds:Auscultation of the heart reveals - No Murmurs.   Abdomen Palpation/Percussion:Tenderness- Abdomen is non-tender to palpation. Rigidity (guarding)- Abdomen is soft. Auscultation:Auscultation of the abdomen reveals - Bowel sounds normal.   Female Genitourinary Not done, not pertinent to present illness  Musculoskeletal She is in no distress. The left hip has minimal motion also. She  has about 10-15 degrees of a flexion contracture with the hip. The knees are nontender with no effusion in the knee. No instability in either knee.  RADIOGRAPHS: AP of the pelvis, lateral of the hip, show obliterated hip joint. There is a massive osteophyte formation in the hip.  Assessment & Plan Osteoarthritis Left Hip  Note: Patient is for a Left Total hip Replacement by Dr. Wynelle Link.  Patient wants to go home but did go to [place] after the previous surgery so we will seee how she progresses.  PCP - Dr. Tommie Ard Baxley - Patient has been seen preoperatively by Dr. Renold Genta and felt to be stable for surgery.  Signed electronically by Jeneen Montgomery, PA-C

## 2012-05-04 ENCOUNTER — Other Ambulatory Visit: Payer: Self-pay | Admitting: Internal Medicine

## 2012-05-15 ENCOUNTER — Encounter (HOSPITAL_COMMUNITY): Payer: Self-pay | Admitting: Pharmacy Technician

## 2012-05-20 ENCOUNTER — Ambulatory Visit (HOSPITAL_COMMUNITY)
Admission: RE | Admit: 2012-05-20 | Discharge: 2012-05-20 | Disposition: A | Discharge status: Home / Self Care | Payer: Medicare Other | Source: Ambulatory Visit | Attending: Orthopedic Surgery | Admitting: Orthopedic Surgery

## 2012-05-20 ENCOUNTER — Encounter (HOSPITAL_COMMUNITY): Payer: Self-pay

## 2012-05-20 ENCOUNTER — Encounter (HOSPITAL_COMMUNITY)
Admission: RE | Admit: 2012-05-20 | Discharge: 2012-05-20 | Disposition: A | Discharge status: Home / Self Care | Payer: Medicare Other | Source: Ambulatory Visit | Attending: Orthopedic Surgery | Admitting: Orthopedic Surgery

## 2012-05-20 DIAGNOSIS — Z01812 Encounter for preprocedural laboratory examination: Secondary | ICD-10-CM | POA: Insufficient documentation

## 2012-05-20 DIAGNOSIS — Z01818 Encounter for other preprocedural examination: Secondary | ICD-10-CM | POA: Insufficient documentation

## 2012-05-20 DIAGNOSIS — M169 Osteoarthritis of hip, unspecified: Secondary | ICD-10-CM | POA: Insufficient documentation

## 2012-05-20 DIAGNOSIS — M1610 Unilateral primary osteoarthritis, unspecified hip: Secondary | ICD-10-CM | POA: Insufficient documentation

## 2012-05-20 HISTORY — DX: Personal history of other diseases of the musculoskeletal system and connective tissue: Z87.39

## 2012-05-20 HISTORY — DX: Thyrotoxicosis, unspecified without thyrotoxic crisis or storm: E05.90

## 2012-05-20 LAB — URINALYSIS, ROUTINE W REFLEX MICROSCOPIC
Bilirubin Urine: NEGATIVE
Glucose, UA: NEGATIVE mg/dL
Hgb urine dipstick: NEGATIVE
Ketones, ur: NEGATIVE mg/dL
Leukocytes, UA: NEGATIVE
Nitrite: NEGATIVE
Protein, ur: NEGATIVE mg/dL
Specific Gravity, Urine: 1.023 (ref 1.005–1.030)
Urobilinogen, UA: 1 mg/dL (ref 0.0–1.0)
pH: 6.5 (ref 5.0–8.0)

## 2012-05-20 LAB — COMPREHENSIVE METABOLIC PANEL
ALT: 52 U/L — ABNORMAL HIGH (ref 0–35)
AST: 46 U/L — ABNORMAL HIGH (ref 0–37)
Albumin: 3.8 g/dL (ref 3.5–5.2)
Alkaline Phosphatase: 112 U/L (ref 39–117)
BUN: 22 mg/dL (ref 6–23)
CO2: 23 mEq/L (ref 19–32)
Calcium: 10 mg/dL (ref 8.4–10.5)
Chloride: 105 mEq/L (ref 96–112)
Creatinine, Ser: 0.89 mg/dL (ref 0.50–1.10)
GFR calc Af Amer: 74 mL/min — ABNORMAL LOW (ref >90)
GFR calc non Af Amer: 64 mL/min — ABNORMAL LOW (ref >90)
Glucose, Bld: 91 mg/dL (ref 70–99)
Potassium: 4.4 mEq/L (ref 3.5–5.1)
Sodium: 139 mEq/L (ref 135–145)
Total Bilirubin: 0.4 mg/dL (ref 0.3–1.2)
Total Protein: 6.4 g/dL (ref 6.0–8.3)

## 2012-05-20 LAB — APTT: aPTT: 30 seconds (ref 24–37)

## 2012-05-20 LAB — CBC
HCT: 40 % (ref 36.0–46.0)
Hemoglobin: 13.8 g/dL (ref 12.0–15.0)
MCH: 29.7 pg (ref 26.0–34.0)
MCHC: 34.5 g/dL (ref 30.0–36.0)
MCV: 86.2 fL (ref 78.0–100.0)
Platelets: 187 10*3/uL (ref 150–400)
RBC: 4.64 MIL/uL (ref 3.87–5.11)
RDW: 13.2 % (ref 11.5–15.5)
WBC: 9.3 10*3/uL (ref 4.0–10.5)

## 2012-05-20 LAB — SURGICAL PCR SCREEN
MRSA, PCR: NEGATIVE
Staphylococcus aureus: NEGATIVE

## 2012-05-20 LAB — PROTIME-INR
INR: 0.92 (ref 0.00–1.49)
Prothrombin Time: 12.6 seconds (ref 11.6–15.2)

## 2012-05-20 MED ORDER — CHLORHEXIDINE GLUCONATE 4 % EX LIQD
60.0000 mL | Freq: Once | CUTANEOUS | Status: DC
  Filled 2012-05-20: qty 60

## 2012-05-20 NOTE — Patient Instructions (Addendum)
[place]  05/20/2012   Your procedure is scheduled on:  05/22/12  Report to [place] STAY DEPT  at 6:30 AM.  Call this number if you have problems the morning of surgery: [phone]   Remember:   Do not eat food or drink liquids AFTER MIDNIGHT    Take these medicines the morning of surgery with A SIP OF WATER: ALLOPURINOL / ATENOLOL / LEVOTHYROXINE   Do not wear jewelry, make-up or nail polish.  Do not wear lotions, powders, or perfumes.   Do not shave legs or underarms 48 hrs. before surgery (men may shave face)  Do not bring valuables to the hospital.  Contacts, dentures or bridgework may not be worn into surgery.  Leave suitcase in the car. After surgery it may be brought to your room.  For patients admitted to the hospital, checkout time is 11:00 AM the day of discharge.   Patients discharged the day of surgery will not be allowed to drive home.    Special Instructions:   Please read over the following fact sheets that you were given: MRSA  Information / [place]

## 2012-05-21 ENCOUNTER — Other Ambulatory Visit: Payer: Self-pay | Admitting: Orthopedic Surgery

## 2012-05-21 NOTE — H&P (Signed)
[name]  DOB: 05-30-1941 Single / Language: English / Race: White Female  Date of Admission:  05/22/2012  Chief Complaint:  Left Hip Pain  History of Present Illness The patient is a 71 year old female who comes in for a preoperative History and Physical. The patient is scheduled for a left total hip arthroplasty to be performed by Dr. Dione Plover. Aluisio, MD at [facility] on 05/22/2012. The patient is a 71 year old female who is post-operative from right total hip arthroplasty. The patient states that she is doing very well at this time. The pain is under excellent control at this time and describes their pain as very little pain or discomfort. They are currently on no medication for their pain. She is anxious and ready to get her left hip done. She is really pleased with the right hip. It has already made a big difference in her life. She is now ready to proceed with the Left Hip Replacement. They have been treated conservatively in the past for the above stated problem and despite conservative measures, they continue to have progressive pain and severe functional limitations and dysfunction. They have failed non-operative management. It is felt that they would benefit from undergoing total joint replacement. Risks and benefits of the procedure have been discussed with the patient and they elect to proceed with surgery. There are no active contraindications to surgery such as ongoing infection or rapidly progressive neurological disease.   Problem List/Past Medical Osteoarthritis, Hip (715.35) Gout Osteoarthritis High blood pressure Hypercholesterolemia. Diet Controlled Cataract Mitral Valve Prolapse Thyroid Nodule Depression Pancreatic Adenoma. S/P Resection  Allergies Codeine Phosphate *ANALGESICS - OPIOID*. synthetics are fine   Family History Cancer. brother Father. Deceased, Myocardial infarction. age 63 Mother. Deceased, Dementia. age  13   Social History Marital status. widowed Tobacco use. Former smoker. only smoked while in her 33s former smoker; smoke(d) less than 1/2 pack(s) per day Tobacco / smoke exposure. no Children. 0 Current work status. 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 current drinker; drinks beer and wine; only occasionally per week Living situation. live with caregiver Number of flights of stairs before winded. less than 1 Pain Contract. no Drug/Alcohol Rehab (Previously). no Exercise. Exercises daily; does other Illicit drug use. no Post-Surgical Plans. Patient wants to go home but did go to [place] after the previous surgery so we will seee how she progresses.   Medication History AmLODIPine Besylate (10MG  Tablet, Oral) Active. Allopurinol (100MG  Tablet, Oral) Active. Atenolol (50MG  Tablet, Oral) Active. Sertraline HCl (50MG  Tablet, Oral) Active. Synthroid (112MCG Tablet, Oral) Active. Zenpep (10000UNIT Capsule DR Part, Oral) Active.   Past Surgical History Colon Polyp Removal - Colonoscopy Neck Disc Surgery Spinal Fusion. lower back Exploratory Laporotomy - Removal Partial Pancreas, Partial Stomach, Gallbaldder. Date: 12/2010. Total Hip Replacement - Right. Date: 01/15/2012.   Review of Systems General:Not Present- Chills, Fever, Night Sweats, Fatigue, Weight Gain, Weight Loss and Memory Loss. Skin:Not Present- Hives, Itching, Rash, Eczema and Lesions. HEENT:Not Present- Tinnitus, Headache, Double Vision, Visual Loss, Hearing Loss and Dentures. Respiratory:Not Present- Shortness of breath with exertion, Shortness of breath at rest, Allergies, Coughing up blood and Chronic Cough. Cardiovascular:Not Present- Chest Pain, Racing/skipping heartbeats, Difficulty Breathing Lying Down, Murmur, Swelling and Palpitations. Gastrointestinal:Not Present- Bloody Stool, Heartburn, Abdominal Pain, Vomiting, Nausea, Constipation, Diarrhea, Difficulty  Swallowing, Jaundice and Loss of appetitie. Female Genitourinary:Not Present- Blood in Urine, Urinary frequency, Weak urinary stream, Discharge, Flank Pain, Incontinence, Painful Urination, Urgency, Urinary Retention and Urinating at  Night. Musculoskeletal:Not Present- Muscle Weakness, Muscle Pain, Joint Swelling, Joint Pain, Back Pain, Morning Stiffness and Spasms. Neurological:Not Present- Tremor, Dizziness, Blackout spells, Paralysis, Difficulty with balance and Weakness. Psychiatric:Not Present- Insomnia.   Vitals Weight: 126 lb Height: 64 in Body Surface Area: 1.61 m Body Mass Index: 21.63 kg/m Pulse: 64 (Regular) Resp.: 12 (Unlabored) BP: 142/72 (Sitting, Right Arm, Standard)    Physical Exam The physical exam findings are as follows:   General Mental Status - Alert, cooperative and good historian. General Appearance- pleasant. Not in acute distress. Orientation- Oriented X3. Build & Nutrition- Well nourished and Well developed.   Head and Neck Head- normocephalic, atraumatic . Neck Global Assessment- supple. no bruit auscultated on the right and no bruit auscultated on the left.   Eye Pupil- Bilateral- Regular and Round. Motion- Bilateral- EOMI.   Chest and Lung Exam Auscultation: Breath sounds:- clear at anterior chest wall and - clear at posterior chest wall. Adventitious sounds:- No Adventitious sounds.   Cardiovascular Auscultation:Rhythm- Regular rate and rhythm. Heart Sounds- S1 WNL and S2 WNL. Murmurs & Other Heart Sounds:Auscultation of the heart reveals - No Murmurs.   Abdomen Palpation/Percussion:Tenderness- Abdomen is non-tender to palpation. Rigidity (guarding)- Abdomen is soft. Auscultation:Auscultation of the abdomen reveals - Bowel sounds normal.   Female Genitourinary Not done, not pertinent to present illness  Musculoskeletal She is in no distress. The left hip has minimal motion also. She  has about 10-15 degrees of a flexion contracture with the hip. The knees are nontender with no effusion in the knee. No instability in either knee.   RADIOGRAPHS: AP of the pelvis, lateral of the hip, show obliterated hip joint. There is a massive osteophyte formation in the hip.  Assessment & Plan Osteoarthritis, Hip (715.35) Impression: Left Hip  Note: Patient is for a Left Total hip Replacement by Dr. Wynelle Link.  Patient wants to go home but did go to [place] after the previous surgery so we will seee how she progresses.  PCP - Dr. Tommie Ard Baxley - Patient has been seen preoperatively by Dr. Renold Genta and felto to be stable for surgery.  Signed electronically by Jeneen Montgomery, PA-C

## 2012-05-22 ENCOUNTER — Ambulatory Visit (HOSPITAL_COMMUNITY): Payer: Medicare Other

## 2012-05-22 ENCOUNTER — Encounter (HOSPITAL_COMMUNITY): Payer: Self-pay | Admitting: *Deleted

## 2012-05-22 ENCOUNTER — Ambulatory Visit (HOSPITAL_COMMUNITY): Payer: Medicare Other | Admitting: *Deleted

## 2012-05-22 ENCOUNTER — Inpatient Hospital Stay (HOSPITAL_COMMUNITY)
Admission: RE | Admit: 2012-05-22 | Discharge: 2012-05-25 | DRG: 470 | Disposition: A | Discharge status: Home Health | Payer: Medicare Other | Source: Ambulatory Visit | Attending: Orthopedic Surgery | Admitting: Orthopedic Surgery

## 2012-05-22 ENCOUNTER — Encounter (HOSPITAL_COMMUNITY)
Admission: RE | Disposition: A | Discharge status: Home Health | Payer: Self-pay | Source: Ambulatory Visit | Attending: Orthopedic Surgery

## 2012-05-22 DIAGNOSIS — Z471 Aftercare following joint replacement surgery: Secondary | ICD-10-CM | POA: Diagnosis not present

## 2012-05-22 DIAGNOSIS — M25559 Pain in unspecified hip: Secondary | ICD-10-CM | POA: Diagnosis not present

## 2012-05-22 DIAGNOSIS — I10 Essential (primary) hypertension: Secondary | ICD-10-CM | POA: Diagnosis not present

## 2012-05-22 DIAGNOSIS — D62 Acute posthemorrhagic anemia: Secondary | ICD-10-CM | POA: Diagnosis not present

## 2012-05-22 DIAGNOSIS — M1610 Unilateral primary osteoarthritis, unspecified hip: Principal | ICD-10-CM | POA: Diagnosis present

## 2012-05-22 DIAGNOSIS — Z96649 Presence of unspecified artificial hip joint: Secondary | ICD-10-CM | POA: Diagnosis not present

## 2012-05-22 DIAGNOSIS — M169 Osteoarthritis of hip, unspecified: Principal | ICD-10-CM | POA: Diagnosis present

## 2012-05-22 HISTORY — PX: TOTAL HIP ARTHROPLASTY: SHX124

## 2012-05-22 SURGERY — ARTHROPLASTY, HIP, TOTAL,POSTERIOR APPROACH
Anesthesia: General | Site: Hip | Laterality: Left | Wound class: Clean

## 2012-05-22 MED ORDER — EPHEDRINE SULFATE 50 MG/ML IJ SOLN
INTRAMUSCULAR | Status: DC | PRN
  Administered 2012-05-22 (×2): 5 mg via INTRAVENOUS

## 2012-05-22 MED ORDER — HYDROMORPHONE HCL PF 1 MG/ML IJ SOLN
0.2500 mg | INTRAMUSCULAR | Status: DC | PRN
  Administered 2012-05-22 (×2): 0.5 mg via INTRAVENOUS

## 2012-05-22 MED ORDER — LACTATED RINGERS IV SOLN: INTRAVENOUS | Status: DC

## 2012-05-22 MED ORDER — METHOCARBAMOL 100 MG/ML IJ SOLN
500.0000 mg | Freq: Four times a day (QID) | INTRAVENOUS | Status: DC | PRN
  Filled 2012-05-22: qty 5

## 2012-05-22 MED ORDER — ONDANSETRON HCL 4 MG/2ML IJ SOLN: 4.0000 mg | Freq: Four times a day (QID) | INTRAMUSCULAR | Status: DC | PRN

## 2012-05-22 MED ORDER — DIPHENHYDRAMINE HCL 12.5 MG/5ML PO ELIX: 12.5000 mg | ORAL_SOLUTION | ORAL | Status: DC | PRN

## 2012-05-22 MED ORDER — MEPERIDINE HCL 50 MG/ML IJ SOLN: 6.2500 mg | INTRAMUSCULAR | Status: DC | PRN

## 2012-05-22 MED ORDER — METOCLOPRAMIDE HCL 5 MG/ML IJ SOLN: 5.0000 mg | Freq: Three times a day (TID) | INTRAMUSCULAR | Status: DC | PRN

## 2012-05-22 MED ORDER — PROPOFOL 10 MG/ML IV EMUL
INTRAVENOUS | Status: DC | PRN
  Administered 2012-05-22: 150 mg via INTRAVENOUS

## 2012-05-22 MED ORDER — CEFAZOLIN SODIUM-DEXTROSE 2-3 GM-% IV SOLR
2.0000 g | INTRAVENOUS | Status: AC
  Administered 2012-05-22: 2 g via INTRAVENOUS

## 2012-05-22 MED ORDER — ONDANSETRON HCL 4 MG/2ML IJ SOLN
INTRAMUSCULAR | Status: DC | PRN
  Administered 2012-05-22: 4 mg via INTRAVENOUS

## 2012-05-22 MED ORDER — POLYETHYLENE GLYCOL 3350 17 G PO PACK: 17.0000 g | PACK | Freq: Every day | ORAL | Status: DC | PRN

## 2012-05-22 MED ORDER — PHENOL 1.4 % MT LIQD
1.0000 | OROMUCOSAL | Status: DC | PRN
  Filled 2012-05-22: qty 177

## 2012-05-22 MED ORDER — ACETAMINOPHEN 650 MG RE SUPP: 650.0000 mg | Freq: Four times a day (QID) | RECTAL | Status: DC | PRN

## 2012-05-22 MED ORDER — SUCCINYLCHOLINE CHLORIDE 20 MG/ML IJ SOLN
INTRAMUSCULAR | Status: DC | PRN
  Administered 2012-05-22: 100 mg via INTRAVENOUS

## 2012-05-22 MED ORDER — DEXTROSE-NACL 5-0.9 % IV SOLN
INTRAVENOUS | Status: DC
  Administered 2012-05-22: 15:00:00 via INTRAVENOUS
  Administered 2012-05-23: 10 mL via INTRAVENOUS

## 2012-05-22 MED ORDER — ACETAMINOPHEN 10 MG/ML IV SOLN: 1000.0000 mg | Freq: Once | INTRAVENOUS | Status: DC

## 2012-05-22 MED ORDER — ACETAMINOPHEN 325 MG PO TABS: 650.0000 mg | ORAL_TABLET | Freq: Four times a day (QID) | ORAL | Status: DC | PRN

## 2012-05-22 MED ORDER — LIDOCAINE HCL (CARDIAC) 20 MG/ML IV SOLN
INTRAVENOUS | Status: DC | PRN
  Administered 2012-05-22: 100 mg via INTRAVENOUS

## 2012-05-22 MED ORDER — BISACODYL 10 MG RE SUPP: 10.0000 mg | Freq: Every day | RECTAL | Status: DC | PRN

## 2012-05-22 MED ORDER — SERTRALINE HCL 50 MG PO TABS
50.0000 mg | ORAL_TABLET | Freq: Every day | ORAL | Status: DC
  Administered 2012-05-22 – 2012-05-24 (×3): 50 mg via ORAL
  Filled 2012-05-22 (×4): qty 1

## 2012-05-22 MED ORDER — PROMETHAZINE HCL 25 MG/ML IJ SOLN: 6.2500 mg | INTRAMUSCULAR | Status: DC | PRN

## 2012-05-22 MED ORDER — DOCUSATE SODIUM 100 MG PO CAPS
100.0000 mg | ORAL_CAPSULE | Freq: Two times a day (BID) | ORAL | Status: DC
  Administered 2012-05-22 – 2012-05-25 (×7): 100 mg via ORAL

## 2012-05-22 MED ORDER — LACTATED RINGERS IV SOLN
INTRAVENOUS | Status: DC
  Administered 2012-05-22: 11:00:00 via INTRAVENOUS
  Administered 2012-05-22: 1000 mL via INTRAVENOUS

## 2012-05-22 MED ORDER — FLEET ENEMA 7-19 GM/118ML RE ENEM: 1.0000 | ENEMA | Freq: Once | RECTAL | Status: AC | PRN

## 2012-05-22 MED ORDER — METHOCARBAMOL 500 MG PO TABS
500.0000 mg | ORAL_TABLET | Freq: Four times a day (QID) | ORAL | Status: DC | PRN
  Administered 2012-05-22 – 2012-05-25 (×6): 500 mg via ORAL
  Filled 2012-05-22 (×7): qty 1

## 2012-05-22 MED ORDER — BUPIVACAINE LIPOSOME 1.3 % IJ SUSP
20.0000 mL | Freq: Once | INTRAMUSCULAR | Status: DC
  Filled 2012-05-22: qty 20

## 2012-05-22 MED ORDER — RIVAROXABAN 10 MG PO TABS
10.0000 mg | ORAL_TABLET | Freq: Every day | ORAL | Status: DC
  Administered 2012-05-23 – 2012-05-25 (×3): 10 mg via ORAL
  Filled 2012-05-22 (×4): qty 1

## 2012-05-22 MED ORDER — DEXAMETHASONE SODIUM PHOSPHATE 10 MG/ML IJ SOLN: 10.0000 mg | Freq: Once | INTRAMUSCULAR | Status: DC

## 2012-05-22 MED ORDER — MENTHOL 3 MG MT LOZG
1.0000 | LOZENGE | OROMUCOSAL | Status: DC | PRN
  Filled 2012-05-22: qty 9

## 2012-05-22 MED ORDER — FENTANYL CITRATE 0.05 MG/ML IJ SOLN
INTRAMUSCULAR | Status: DC | PRN
  Administered 2012-05-22 (×2): 50 ug via INTRAVENOUS
  Administered 2012-05-22: 100 ug via INTRAVENOUS
  Administered 2012-05-22: 50 ug via INTRAVENOUS

## 2012-05-22 MED ORDER — HETASTARCH-ELECTROLYTES 6 % IV SOLN
INTRAVENOUS | Status: DC | PRN
  Administered 2012-05-22: 10:00:00 via INTRAVENOUS

## 2012-05-22 MED ORDER — OXYCODONE HCL 5 MG PO TABS
5.0000 mg | ORAL_TABLET | ORAL | Status: DC | PRN
  Administered 2012-05-22 – 2012-05-25 (×6): 10 mg via ORAL
  Filled 2012-05-22 (×6): qty 2

## 2012-05-22 MED ORDER — PANCRELIPASE (LIP-PROT-AMYL) 12000-38000 UNITS PO CPEP
1.0000 | ORAL_CAPSULE | Freq: Three times a day (TID) | ORAL | Status: DC
  Administered 2012-05-22 – 2012-05-23 (×3): 2 via ORAL
  Administered 2012-05-23: 1 via ORAL
  Administered 2012-05-24 – 2012-05-25 (×4): 2 via ORAL
  Filled 2012-05-22 (×12): qty 2

## 2012-05-22 MED ORDER — HYDROMORPHONE HCL PF 1 MG/ML IJ SOLN
INTRAMUSCULAR | Status: AC
  Filled 2012-05-22: qty 1

## 2012-05-22 MED ORDER — BUPIVACAINE LIPOSOME 1.3 % IJ SUSP
INTRAMUSCULAR | Status: DC | PRN
  Administered 2012-05-22: 20 mL

## 2012-05-22 MED ORDER — ONDANSETRON HCL 4 MG PO TABS: 4.0000 mg | ORAL_TABLET | Freq: Four times a day (QID) | ORAL | Status: DC | PRN

## 2012-05-22 MED ORDER — ATENOLOL 50 MG PO TABS
50.0000 mg | ORAL_TABLET | Freq: Every day | ORAL | Status: DC
  Filled 2012-05-22 (×3): qty 1

## 2012-05-22 MED ORDER — 0.9 % SODIUM CHLORIDE (POUR BTL) OPTIME
TOPICAL | Status: DC | PRN
  Administered 2012-05-22: 1000 mL

## 2012-05-22 MED ORDER — MIDAZOLAM HCL 5 MG/5ML IJ SOLN
INTRAMUSCULAR | Status: DC | PRN
  Administered 2012-05-22: 2 mg via INTRAVENOUS

## 2012-05-22 MED ORDER — AMLODIPINE BESYLATE 10 MG PO TABS
10.0000 mg | ORAL_TABLET | Freq: Every day | ORAL | Status: DC
  Filled 2012-05-22 (×3): qty 1

## 2012-05-22 MED ORDER — HYDROMORPHONE HCL PF 1 MG/ML IJ SOLN
INTRAMUSCULAR | Status: DC | PRN
  Administered 2012-05-22 (×2): 0.5 mg via INTRAVENOUS

## 2012-05-22 MED ORDER — SODIUM CHLORIDE 0.9 % IV SOLN: INTRAVENOUS | Status: DC

## 2012-05-22 MED ORDER — DEXAMETHASONE SODIUM PHOSPHATE 10 MG/ML IJ SOLN
INTRAMUSCULAR | Status: DC | PRN
  Administered 2012-05-22: 10 mg via INTRAVENOUS

## 2012-05-22 MED ORDER — LEVOTHYROXINE SODIUM 112 MCG PO TABS
112.0000 ug | ORAL_TABLET | Freq: Every day | ORAL | Status: DC
  Administered 2012-05-22 – 2012-05-24 (×3): 112 ug via ORAL
  Filled 2012-05-22 (×4): qty 1

## 2012-05-22 MED ORDER — TEMAZEPAM 15 MG PO CAPS: 15.0000 mg | ORAL_CAPSULE | Freq: Every evening | ORAL | Status: DC | PRN

## 2012-05-22 MED ORDER — MORPHINE SULFATE 2 MG/ML IJ SOLN: 1.0000 mg | INTRAMUSCULAR | Status: DC | PRN

## 2012-05-22 MED ORDER — CEFAZOLIN SODIUM-DEXTROSE 2-3 GM-% IV SOLR
INTRAVENOUS | Status: AC
  Filled 2012-05-22: qty 50

## 2012-05-22 MED ORDER — ALLOPURINOL 100 MG PO TABS
100.0000 mg | ORAL_TABLET | Freq: Every day | ORAL | Status: DC
  Administered 2012-05-23 – 2012-05-25 (×3): 100 mg via ORAL
  Filled 2012-05-22 (×4): qty 1

## 2012-05-22 MED ORDER — METOCLOPRAMIDE HCL 10 MG PO TABS: 5.0000 mg | ORAL_TABLET | Freq: Three times a day (TID) | ORAL | Status: DC | PRN

## 2012-05-22 MED ORDER — CEFAZOLIN SODIUM 1-5 GM-% IV SOLN
1.0000 g | Freq: Four times a day (QID) | INTRAVENOUS | Status: AC
  Administered 2012-05-22 – 2012-05-23 (×3): 1 g via INTRAVENOUS
  Filled 2012-05-22 (×3): qty 50

## 2012-05-22 SURGICAL SUPPLY — 52 items
BAG SPEC THK2 15X12 ZIP CLS (MISCELLANEOUS) ×1
BAG ZIPLOCK 12X15 (MISCELLANEOUS) ×2 IMPLANT
BIT DRILL 2.8X128 (BIT) ×2 IMPLANT
BLADE EXTENDED COATED 6.5IN (ELECTRODE) ×2 IMPLANT
BLADE SAW SAG 73X25 THK (BLADE) ×1
BLADE SAW SGTL 73X25 THK (BLADE) ×1 IMPLANT
CLOTH BEACON ORANGE TIMEOUT ST (SAFETY) ×2 IMPLANT
DECANTER SPIKE VIAL GLASS SM (MISCELLANEOUS) ×2 IMPLANT
DRAPE INCISE IOBAN 66X45 STRL (DRAPES) ×2 IMPLANT
DRAPE ORTHO SPLIT 77X108 STRL (DRAPES) ×4
DRAPE POUCH INSTRU U-SHP 10X18 (DRAPES) ×2 IMPLANT
DRAPE SURG ORHT 6 SPLT 77X108 (DRAPES) ×2 IMPLANT
DRAPE U-SHAPE 47X51 STRL (DRAPES) ×2 IMPLANT
DRSG ADAPTIC 3X8 NADH LF (GAUZE/BANDAGES/DRESSINGS) ×2 IMPLANT
DRSG MEPILEX BORDER 4X4 (GAUZE/BANDAGES/DRESSINGS) ×2 IMPLANT
DRSG MEPILEX BORDER 4X8 (GAUZE/BANDAGES/DRESSINGS) ×2 IMPLANT
DURAPREP 26ML APPLICATOR (WOUND CARE) ×2 IMPLANT
ELECT REM PT RETURN 9FT ADLT (ELECTROSURGICAL) ×2
ELECTRODE REM PT RTRN 9FT ADLT (ELECTROSURGICAL) ×1 IMPLANT
EVACUATOR 1/8 PVC DRAIN (DRAIN) ×2 IMPLANT
FACESHIELD LNG OPTICON STERILE (SAFETY) ×8 IMPLANT
GLOVE BIO SURGEON STRL SZ7.5 (GLOVE) ×2 IMPLANT
GLOVE BIO SURGEON STRL SZ8 (GLOVE) ×2 IMPLANT
GLOVE BIOGEL PI IND STRL 8 (GLOVE) ×2 IMPLANT
GLOVE BIOGEL PI INDICATOR 8 (GLOVE) ×2
GOWN STRL NON-REIN LRG LVL3 (GOWN DISPOSABLE) ×2 IMPLANT
GOWN STRL REIN XL XLG (GOWN DISPOSABLE) ×2 IMPLANT
IMMOBILIZER KNEE 20 (SOFTGOODS) ×2
IMMOBILIZER KNEE 20 THIGH 36 (SOFTGOODS) IMPLANT
KIT BASIN OR (CUSTOM PROCEDURE TRAY) ×2 IMPLANT
MANIFOLD NEPTUNE II (INSTRUMENTS) ×2 IMPLANT
NDL SAFETY ECLIPSE 18X1.5 (NEEDLE) ×1 IMPLANT
NEEDLE HYPO 18GX1.5 SHARP (NEEDLE) ×2
NS IRRIG 1000ML POUR BTL (IV SOLUTION) ×2 IMPLANT
PACK TOTAL JOINT (CUSTOM PROCEDURE TRAY) ×2 IMPLANT
PASSER SUT SWANSON 36MM LOOP (INSTRUMENTS) ×2 IMPLANT
POSITIONER SURGICAL ARM (MISCELLANEOUS) ×2 IMPLANT
SPONGE GAUZE 4X4 12PLY (GAUZE/BANDAGES/DRESSINGS) ×2 IMPLANT
STRIP CLOSURE SKIN 1/2X4 (GAUZE/BANDAGES/DRESSINGS) ×4 IMPLANT
SUT ETHIBOND NAB CT1 #1 30IN (SUTURE) ×4 IMPLANT
SUT MNCRL AB 4-0 PS2 18 (SUTURE) ×2 IMPLANT
SUT VIC AB 1 CT1 27 (SUTURE)
SUT VIC AB 1 CT1 27XBRD ANTBC (SUTURE) ×2 IMPLANT
SUT VIC AB 2-0 CT1 27 (SUTURE) ×6
SUT VIC AB 2-0 CT1 TAPERPNT 27 (SUTURE) ×3 IMPLANT
SUT VLOC 180 0 24IN GS25 (SUTURE) ×3 IMPLANT
SYR 50ML LL SCALE MARK (SYRINGE) ×2 IMPLANT
TOWEL OR 17X26 10 PK STRL BLUE (TOWEL DISPOSABLE) ×4 IMPLANT
TOWEL OR NON WOVEN STRL DISP B (DISPOSABLE) ×2 IMPLANT
TRAY FOLEY CATH 14FRSI W/METER (CATHETERS) ×2 IMPLANT
TUBING CONNECTING 10 (TUBING) ×1 IMPLANT
WATER STERILE IRR 1500ML POUR (IV SOLUTION) ×2 IMPLANT

## 2012-05-22 NOTE — Op Note (Signed)
Pre-operative diagnosis- Osteoarthritis Left hip  Post-operative diagnosis- Osteoarthritis  Left hip  Procedure-  LeftTotal Hip Arthroplasty  Surgeon- Dione Plover. Tiarah Shisler, MD  Assistant- Arlee Muslim, PA-C   Anesthesia  General  EBL- 250   Drain Hemovac   Complication- None  Condition-PACU - hemodynamically stable.   Brief Clinical Note-  [name] is a 71 y.o. female with end stage arthritis of her left hip with progressively worsening pain and dysfunction. Pain occurs with activity and rest including pain at night. She has tried analgesics, protected weight bearing and rest without benefit. Pain is too severe to attempt physical therapy. Radiographs demonstrate bone on bone arthritis with subchondral cyst formation, near ankylosis of the joint and massive circumferential osteophyte formation. She presents now for left THA.  Procedure in detail-   The patient is brought into the operating room and placed on the operating table. After successful administration of General  anesthesia, the patient is placed in the  Right lateral decubitus position with the  Left side up and held in place with the hip positioner. The lower extremity is isolated from the perineum with plastic drapes and time-out is performed by the surgical team. The lower extremity is then prepped and draped in the usual sterile fashion. A short posterolateral incision is made with a ten blade through the subcutaneous tissue to the level of the fascia lata which is incised in line with the skin incision. The sciatic nerve is palpated and protected and the short external rotators and capsule are isolated from the femur. The hip is then dislocated and the center of the femoral head is marked. A trial prosthesis is placed such that the trial head corresponds to the center of the patients' native femoral head. The resection level is marked on the femoral neck and the resection is made with an oscillating saw. The femoral head is  removed and femoral retractors placed to gain access to the femoral canal.      The canal finder is passed into the femoral canal and the canal is thoroughly irrigated with sterile saline to remove the fatty contents. Axial reaming is performed to 13.5  mm, proximal reaming to 18 D  and the sleeve machined to a small. A 18 D small trial sleeve is placed into the proximal femur.      The femur is then retracted anteriorly to gain acetabular exposure. Acetabular retractors are placed and the labrum and massive amount of osteophytes are removed, Acetabular reaming is performed to 53  mm and a 54  mm Pinnacle acetabular shell is placed in anatomic position with excellent purchase. Additional dome screws were placed. An apex hole eliminator is placed and the permanent 36 mm neutral + 4 Marathon liner is placed into the acetabular shell.      The trial femur is then placed into the femoral canal. The size is 18 x 13  stem with a 36 + 8 neck and a 36 + 6 head with the neck version matching the patients' native anteversion. The hip is reduced with excellent stability with full extension and full external rotation, 70 degrees flexion with 40 degrees adduction and 90 degrees internal rotation and 90 degrees of flexion with 70 degrees of internal rotation. The operative leg is placed on top of the non-operative leg and the leg lengths are found to be equal. The trials are then removed and the permanent implant of the same size is impacted into the femoral canal. The  Ceramic femoral  head of the same size as the trial is placed and the hip is reduced with the same stability parameters. The operative leg is again placed on top of the non-operative leg and the leg lengths are found to be equal.      The wound is then copiously irrigated with saline solution and the capsule and short external rotators are re-attached to the femur through drill holes with Ethibond suture. The fascia lata is closed over a hemovac drain with #1  vicryl suture and the fascia lata, gluteal muscles and subcutaneous tissues are injected with Exparel 51ml diluted with saline 36ml. The subcutaneous tissues are closed with #1 and2-0 vicryl and the subcuticular layer closed with running 4-0 Monocryl. The drain is hooked to suction, incision cleaned and dried, and steri-srips and a bulky sterile dressing applied. The limb is placed into a knee immobilizer and the patient is awakened and transported to recovery in stable condition.      Please note that a surgical assistant was a medical necessity for this procedure in order to perform it in a safe and expeditious manner. The assistant was necessary to provide retraction to the vital neurovascular structures and to retract and position the limb to allow for anatomic placement of the prosthetic components.  Dione Plover Iran Kievit, MD    05/22/2012, 10:27 AM

## 2012-05-22 NOTE — H&P (View-Only) (Signed)
[name]  DOB: 1941/01/14 Single / Language: English / Race: White Female  Date of Admission:  05/22/2012  Chief Complaint:  Left Hip Pain  History of Present Illness The patient is a 71 year old female who comes in for a preoperative History and Physical. The patient is scheduled for a left total hip arthroplasty to be performed by Dr. Dione Plover. Aluisio, MD at [facility] on 05/22/2012. The patient is a 71 year old female who is post-operative from right total hip arthroplasty. The patient states that she is doing very well at this time. The pain is under excellent control at this time and describes their pain as very little pain or discomfort. They are currently on no medication for their pain. She is anxious and ready to get her left hip done. She is really pleased with the right hip. It has already made a big difference in her life. She is now ready to proceed with the Left Hip Replacement. They have been treated conservatively in the past for the above stated problem and despite conservative measures, they continue to have progressive pain and severe functional limitations and dysfunction. They have failed non-operative management. It is felt that they would benefit from undergoing total joint replacement. Risks and benefits of the procedure have been discussed with the patient and they elect to proceed with surgery. There are no active contraindications to surgery such as ongoing infection or rapidly progressive neurological disease.   Problem List/Past Medical Osteoarthritis, Hip (715.35) Gout Osteoarthritis High blood pressure Hypercholesterolemia. Diet Controlled Cataract Mitral Valve Prolapse Thyroid Nodule Depression Pancreatic Adenoma. S/P Resection   Allergies Codeine Phosphate *ANALGESICS - OPIOID*. synthetics are fine   Family History Cancer. brother Father. Deceased, Myocardial infarction. age 65 Mother. Deceased, Dementia. age  86   Social History Marital status. widowed Tobacco use. Former smoker. only smoked while in her 68s former smoker; smoke(d) less than 1/2 pack(s) per day Tobacco / smoke exposure. no Children. 0 Current work status. 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 current drinker; drinks beer and wine; only occasionally per week Living situation. live with caregiver Number of flights of stairs before winded. less than 1 Pain Contract. no Drug/Alcohol Rehab (Previously). no Exercise. Exercises daily; does other Illicit drug use. no Post-Surgical Plans. Patient wants to go home but did go to [facility] after the previous surgery so we will seee how she progresses.   Medication History AmLODIPine Besylate (10MG  Tablet, Oral) Active. Allopurinol (100MG  Tablet, Oral) Active. Atenolol (50MG  Tablet, Oral) Active. Sertraline HCl (50MG  Tablet, Oral) Active. Synthroid (112MCG Tablet, Oral) Active. Zenpep (10000UNIT Capsule DR Part, Oral) Active.   Past Surgical History Colon Polyp Removal - Colonoscopy Neck Disc Surgery Spinal Fusion. lower back Exploratory Laporotomy - Removal Partial Pancreas, Partial Stomach, Gallbaldder. Date: 12/2010. Total Hip Replacement - Right. Date: 01/15/2012.  Review of Systems General:Not Present- Chills, Fever, Night Sweats, Fatigue, Weight Gain, Weight Loss and Memory Loss. Skin:Not Present- Hives, Itching, Rash, Eczema and Lesions. HEENT:Not Present- Tinnitus, Headache, Double Vision, Visual Loss, Hearing Loss and Dentures. Respiratory:Not Present- Shortness of breath with exertion, Shortness of breath at rest, Allergies, Coughing up blood and Chronic Cough. Cardiovascular:Not Present- Chest Pain, Racing/skipping heartbeats, Difficulty Breathing Lying Down, Murmur, Swelling and Palpitations. Gastrointestinal:Not Present- Bloody Stool, Heartburn, Abdominal Pain, Vomiting, Nausea, Constipation, Diarrhea, Difficulty  Swallowing, Jaundice and Loss of appetitie. Female Genitourinary:Not Present- Blood in Urine, Urinary frequency, Weak urinary stream, Discharge, Flank Pain, Incontinence, Painful Urination, Urgency, Urinary Retention and Urinating at  Night. Musculoskeletal:Not Present- Muscle Weakness, Muscle Pain, Joint Swelling, Joint Pain, Back Pain, Morning Stiffness and Spasms. Neurological:Not Present- Tremor, Dizziness, Blackout spells, Paralysis, Difficulty with balance and Weakness. Psychiatric:Not Present- Insomnia.   Vitals Weight: 126 lb Height: 64 in Body Surface Area: 1.61 m Body Mass Index: 21.63 kg/m Pulse: 64 (Regular) Resp.: 12 (Unlabored) BP: 142/72 (Sitting, Right Arm, Standard)    Physical Exam The physical exam findings are as follows:   General Mental Status - Alert, cooperative and good historian. General Appearance- pleasant. Not in acute distress. Orientation- Oriented X3. Build & Nutrition- Well nourished and Well developed.   Head and Neck Head- normocephalic, atraumatic . Neck Global Assessment- supple. no bruit auscultated on the right and no bruit auscultated on the left.   Eye Pupil- Bilateral- Regular and Round. Motion- Bilateral- EOMI.   Chest and Lung Exam Auscultation: Breath sounds:- clear at anterior chest wall and - clear at posterior chest wall. Adventitious sounds:- No Adventitious sounds.   Cardiovascular Auscultation:Rhythm- Regular rate and rhythm. Heart Sounds- S1 WNL and S2 WNL. Murmurs & Other Heart Sounds:Auscultation of the heart reveals - No Murmurs.   Abdomen Palpation/Percussion:Tenderness- Abdomen is non-tender to palpation. Rigidity (guarding)- Abdomen is soft. Auscultation:Auscultation of the abdomen reveals - Bowel sounds normal.   Female Genitourinary Not done, not pertinent to present illness  Musculoskeletal She is in no distress. The left hip has minimal motion also. She  has about 10-15 degrees of a flexion contracture with the hip. The knees are nontender with no effusion in the knee. No instability in either knee.  RADIOGRAPHS: AP of the pelvis, lateral of the hip, show obliterated hip joint. There is a massive osteophyte formation in the hip.  Assessment & Plan Osteoarthritis Left Hip  Note: Patient is for a Left Total hip Replacement by Dr. Wynelle Link.  Patient wants to go home but did go to [facility] after the previous surgery so we will seee how she progresses.  PCP - Dr. Tommie Ard Baxley - Patient has been seen preoperatively by Dr. Renold Genta and felt to be stable for surgery.  Signed electronically by Jeneen Montgomery, PA-C

## 2012-05-22 NOTE — Interval H&P Note (Signed)
History and Physical Interval Note:  05/22/2012 8:40 AM  [name]  has presented today for surgery, with the diagnosis of osteoarthritis left hip  The various methods of treatment have been discussed with the patient and family. After consideration of risks, benefits and other options for treatment, the patient has consented to  Procedure(s) (LRB): TOTAL HIP ARTHROPLASTY (Left) as a surgical intervention .  The patients' history has been reviewed, patient examined, no change in status, stable for surgery.  I have reviewed the patients' chart and labs.  Questions were answered to the patient's satisfaction.     Gearlean Alf

## 2012-05-22 NOTE — Anesthesia Postprocedure Evaluation (Signed)
  Anesthesia Post-op Note  Patient: [name]  Procedure(s) Performed: Procedure(s) (LRB): TOTAL HIP ARTHROPLASTY (Left)  Patient Location: PACU  Anesthesia Type: General  Level of Consciousness: awake and alert   Airway and Oxygen Therapy: Patient Spontanous Breathing  Post-op Pain: mild  Post-op Assessment: Post-op Vital signs reviewed, Patient's Cardiovascular Status Stable, Respiratory Function Stable, Patent Airway and No signs of Nausea or vomiting  Post-op Vital Signs: stable  Complications: No apparent anesthesia complications

## 2012-05-22 NOTE — Progress Notes (Signed)
UR COMPLETED  

## 2012-05-22 NOTE — Anesthesia Preprocedure Evaluation (Signed)
Anesthesia Evaluation  Patient identified by MRN, date of birth, ID band Patient awake    Reviewed: Allergy & Precautions, H&P , NPO status , Patient's Chart, lab work & pertinent test results  Airway Mallampati: II TM Distance: >3 FB Neck ROM: Full    Dental No notable dental hx.    Pulmonary neg pulmonary ROS,  breath sounds clear to auscultation  Pulmonary exam normal       Cardiovascular hypertension, Pt. on medications and Pt. on home beta blockers Rhythm:Regular Rate:Normal     Neuro/Psych negative neurological ROS  negative psych ROS   GI/Hepatic negative GI ROS, Neg liver ROS,   Endo/Other  negative endocrine ROSHyperthyroidism   Renal/GU negative Renal ROS  negative genitourinary   Musculoskeletal negative musculoskeletal ROS (+)   Abdominal   Peds negative pediatric ROS (+)  Hematology negative hematology ROS (+)   Anesthesia Other Findings   Reproductive/Obstetrics negative OB ROS                           Anesthesia Physical  Anesthesia Plan  ASA: II  Anesthesia Plan: General   Post-op Pain Management:    Induction: Intravenous  Airway Management Planned:   Additional Equipment:   Intra-op Plan:   Post-operative Plan: Extubation in OR  Informed Consent: I have reviewed the patients History and Physical, chart, labs and discussed the procedure including the risks, benefits and alternatives for the proposed anesthesia with the patient or authorized representative who has indicated his/her understanding and acceptance.   Dental advisory given  Plan Discussed with: CRNA  Anesthesia Plan Comments: (Pt refuses SAB.  Mac 3 grade 3 view last time)        Anesthesia Quick Evaluation

## 2012-05-22 NOTE — Transfer of Care (Signed)
Immediate Anesthesia Transfer of Care Note  Patient: [name]  Procedure(s) Performed: Procedure(s) (LRB): TOTAL HIP ARTHROPLASTY (Left)  Patient Location: PACU  Anesthesia Type: General  Level of Consciousness: awake, alert  and oriented  Airway & Oxygen Therapy: Patient Spontanous Breathing and Patient connected to face mask oxygen  Post-op Assessment: Report given to PACU RN and Post -op Vital signs reviewed and stable  Post vital signs: Reviewed and stable  Complications: No apparent anesthesia complications

## 2012-05-22 NOTE — Preoperative (Signed)
Beta Blockers   Reason not to administer Beta Blockers:pt took atenolol this am

## 2012-05-23 LAB — BASIC METABOLIC PANEL
BUN: 19 mg/dL (ref 6–23)
CO2: 23 mEq/L (ref 19–32)
Calcium: 8.5 mg/dL (ref 8.4–10.5)
Chloride: 107 mEq/L (ref 96–112)
Creatinine, Ser: 0.82 mg/dL (ref 0.50–1.10)
GFR calc Af Amer: 81 mL/min — ABNORMAL LOW (ref >90)
GFR calc non Af Amer: 70 mL/min — ABNORMAL LOW (ref >90)
Glucose, Bld: 152 mg/dL — ABNORMAL HIGH (ref 70–99)
Potassium: 4.1 mEq/L (ref 3.5–5.1)
Sodium: 137 mEq/L (ref 135–145)

## 2012-05-23 LAB — CBC
HCT: 25 % — ABNORMAL LOW (ref 36.0–46.0)
Hemoglobin: 8.4 g/dL — ABNORMAL LOW (ref 12.0–15.0)
MCH: 29.4 pg (ref 26.0–34.0)
MCHC: 33.6 g/dL (ref 30.0–36.0)
MCV: 87.4 fL (ref 78.0–100.0)
Platelets: 122 10*3/uL — ABNORMAL LOW (ref 150–400)
RBC: 2.86 MIL/uL — ABNORMAL LOW (ref 3.87–5.11)
RDW: 13.2 % (ref 11.5–15.5)
WBC: 12.4 10*3/uL — ABNORMAL HIGH (ref 4.0–10.5)

## 2012-05-23 LAB — PREPARE RBC (CROSSMATCH)

## 2012-05-23 MED ORDER — DIPHENHYDRAMINE HCL 25 MG PO CAPS
25.0000 mg | ORAL_CAPSULE | Freq: Once | ORAL | Status: AC
  Administered 2012-05-23: 25 mg via ORAL
  Filled 2012-05-23: qty 1

## 2012-05-23 MED ORDER — ACETAMINOPHEN 325 MG PO TABS
650.0000 mg | ORAL_TABLET | Freq: Once | ORAL | Status: AC
  Administered 2012-05-23: 650 mg via ORAL
  Filled 2012-05-23: qty 2

## 2012-05-23 MED ORDER — FUROSEMIDE 10 MG/ML IJ SOLN
10.0000 mg | Freq: Once | INTRAMUSCULAR | Status: AC
  Administered 2012-05-23: 10 mg via INTRAVENOUS
  Filled 2012-05-23: qty 1

## 2012-05-23 NOTE — Evaluation (Signed)
Occupational Therapy Evaluation Patient Details Name: [name] MRN: [id number] DOB: [birth year] Today's Date: 05/23/2012 Time: [id number] OT Time Calculation (min): 24 min  OT Assessment / Plan / Recommendation Clinical Impression  Pt doing well POD 1 L THR despite a brief period of unresponsiveness at the end of the session. Skilled OT recommended to maximize I w/BADLs to supervision level in prep for safe d/c home with prn A from family.    OT Assessment  Patient needs continued OT Services    Follow Up Recommendations  No OT follow up    Barriers to Discharge      Equipment Recommendations  None recommended by OT    Recommendations for Other Services    Frequency  Min 2X/week    Precautions / Restrictions Precautions Precautions: Posterior Hip Precaution Booklet Issued: Yes (comment) Precaution Comments: Provided handout and educated pt/family Restrictions Weight Bearing Restrictions: No LLE Weight Bearing: Weight bearing as tolerated   Pertinent Vitals/Pain BP 95/57 after a brief period of unresponsiveness    ADL  Grooming: Simulated;Set up Where Assessed - Grooming: Unsupported sitting Upper Body Bathing: Simulated;Set up Where Assessed - Upper Body Bathing: Unsupported sitting Lower Body Bathing: Simulated;Minimal assistance Where Assessed - Lower Body Bathing: Supported sit to stand Upper Body Dressing: Simulated;Set up Where Assessed - Upper Body Dressing: Unsupported sitting Lower Body Dressing: Simulated;Minimal assistance Where Assessed - Lower Body Dressing: Sopported sit to stand Toilet Transfer: Simulated;Min guard Toilet Transfer Method: Sit to Loss adjuster, chartered: Other (comment) (to recliner.) Toileting - Clothing Manipulation and Hygiene: Simulated;Minimal assistance Where Assessed - Best boy and Hygiene: Sit to stand from 3-in-1 or toilet Equipment Used: Rolling walker ADL Comments: Pt still utilizes AE from  previous hip sx. Pt had a brief period of unresponsiveness at the end of session. Easily roused. RN made aware BP95/57. Pt able to verbalize 2/3 hip precautions. Provided handout.    OT Diagnosis: Generalized weakness  OT Problem List: Decreased activity tolerance;Decreased safety awareness;Decreased knowledge of use of DME or AE;Decreased knowledge of precautions OT Treatment Interventions: Self-care/ADL training;Therapeutic activities;DME and/or AE instruction;Patient/family education   OT Goals Acute Rehab OT Goals OT Goal Formulation: With patient Potential to Achieve Goals: Good ADL Goals Pt Will Perform Grooming: with supervision;Standing at sink (x 3 tasks to improve standing activity tolerance.) ADL Goal: Grooming - Progress: Goal set today Pt Will Transfer to Toilet: with supervision;Maintaining hip precautions;3-in-1;Ambulation ADL Goal: Toilet Transfer - Progress: Goal set today Pt Will Perform Toileting - Clothing Manipulation: with supervision;Sitting on 3-in-1 or toilet;Standing ADL Goal: Toileting - Clothing Manipulation - Progress: Goal set today Pt Will Perform Toileting - Hygiene: with supervision;Sit to stand from 3-in-1/toilet ADL Goal: Toileting - Hygiene - Progress: Goal set today Pt Will Perform Tub/Shower Transfer: Tub transfer;Shower seat with back;Maintaining hip precautions ADL Goal: Tub/Shower Transfer - Progress: Goal set today Miscellaneous OT Goals Miscellaneous OT Goal #1: Pt will verbalize 3/3 back precautions during ADLs. OT Goal: Miscellaneous Goal #1 - Progress: Goal set today  Visit Information  Last OT Received On: 05/23/12 Assistance Needed: +1 PT/OT Co-Evaluation/Treatment: Yes    Subjective Data  Subjective: Ive been through this before. Patient Stated Goal: Return home at discharge.   Prior Functioning  Home Living Lives With:  (niece) Available Help at Discharge: Family;Available PRN/intermittently Type of Home: House Home Access:  Level entry Home Layout: One level Bathroom Shower/Tub: Chiropodist: Standard Home Adaptive Equipment: Shower chair with back;Bedside commode/3-in-1;Sock aid;Long-handled shoehorn;Reacher;Long-handled sponge;Walker - rolling;Straight  cane Prior Function Level of Independence: Independent with assistive device(s) Able to Take Stairs?: Yes Driving: Yes Vocation: Retired Corporate investment banker: No difficulties Dominant Hand: Right    Cognition  Overall Cognitive Status: Appears within functional limits for tasks assessed/performed Arousal/Alertness: Awake/alert Orientation Level: Appears intact for tasks assessed Behavior During Session: [facility] for tasks performed    Extremity/Trunk Assessment Right Upper Extremity Assessment RUE ROM/Strength/Tone: [facility] for tasks assessed Left Upper Extremity Assessment LUE ROM/Strength/Tone: WFL for tasks assessed Right Lower Extremity Assessment RLE ROM/Strength/Tone: WFL for tasks assessed RLE Sensation: WFL - Light Touch RLE Coordination: WFL - gross motor Left Lower Extremity Assessment LLE ROM/Strength/Tone: Deficits LLE ROM/Strength/Tone Deficits: ankle motions WFL, able to raise leg slightly against gravity LLE Sensation: WFL - Light Touch LLE Coordination: WFL - gross motor Trunk Assessment Trunk Assessment: Normal   Mobility Bed Mobility Bed Mobility: Supine to Sit;Sitting - Scoot to Edge of Bed Supine to Sit: 4: Min assist;HOB elevated;With rails Sitting - Scoot to Edge of Bed: 4: Min guard Details for Bed Mobility Assistance: Min assist for LLE off of bed with cues for UE placement to self assist trunk.  Transfers Sit to Stand: 4: Min guard;From elevated surface;With upper extremity assist;From bed Stand to Sit: 4: Min guard;With upper extremity assist;With armrests;To chair/3-in-1 Details for Transfer Assistance: Min/guard for safety when sitting/standing with min cues for hand placement LLE management when  sitting/standing to maintain hip precautions.    Exercise    Balance    End of Session OT - End of Session Activity Tolerance: Treatment limited secondary to medical complications (Comment) (a brief period of unresponsiveness.) Patient left: in chair;with call bell/phone within reach;with family/visitor present   Maeve Debord A OTR/L [phone] 05/23/2012, 11:49 AM

## 2012-05-23 NOTE — Progress Notes (Signed)
Physical Therapy Treatment Patient Details Name: [name] MRN: [id number] DOB: [birth year] Today's Date: 05/23/2012 Time: [id number] PT Time Calculation (min): 19 min  PT Assessment / Plan / Recommendation Comments on Treatment Session  Pt progressing well with ambulation and exercises.  Pt with no c/o dizziness during pm session.  Still to receive unit of blood this afternoon.     Follow Up Recommendations  Home health PT    Barriers to Discharge None      Equipment Recommendations  None recommended by PT    Recommendations for Other Services    Frequency 7X/week   Plan Discharge plan remains appropriate    Precautions / Restrictions Precautions Precautions: Posterior Hip Precaution Booklet Issued: Yes (comment) Precaution Comments: Provided handout and educated pt/family Restrictions Weight Bearing Restrictions: No LLE Weight Bearing: Weight bearing as tolerated   Pertinent Vitals/Pain 3/10    Mobility  Bed Mobility Bed Mobility: Sit to Supine Supine to Sit: 4: Min assist;HOB elevated;With rails Sitting - Scoot to Edge of Bed: 4: Min guard Sit to Supine: 4: Min assist Details for Bed Mobility Assistance: Assist for LLE into bed.  Pt demos good UE technique for control of trunk.   Transfers Transfers: Sit to Stand;Stand to Sit Sit to Stand: 4: Min guard;With upper extremity assist;With armrests;From chair/3-in-1 Stand to Sit: 4: Min guard;With upper extremity assist;To elevated surface;To bed Details for Transfer Assistance: Min/guard for safety with min cues for hand placement and LE management when sitting/standing.  Ambulation/Gait Ambulation/Gait Assistance: 4: Min guard Ambulation Distance (Feet): 80 Feet Assistive device: Rolling walker Ambulation/Gait Assistance Details: Min cues for not stepping too far into RW.  Gait Pattern: Step-to pattern;Step-through pattern Gait velocity: decreased Stairs: No Wheelchair Mobility Wheelchair Mobility: No      Exercises Total Joint Exercises Ankle Circles/Pumps: AROM;Both;20 reps Quad Sets: AROM;Left;10 reps Short Arc Quad: AROM;Left;10 reps Heel Slides: AAROM;Left;10 reps Hip ABduction/ADduction: AAROM;Left;10 reps   PT Diagnosis: Difficulty walking;Abnormality of gait;Generalized weakness;Acute pain  PT Problem List: Decreased strength;Decreased range of motion;Decreased activity tolerance;Decreased mobility;Decreased knowledge of precautions;Pain;Decreased knowledge of use of DME PT Treatment Interventions: DME instruction;Gait training;Functional mobility training;Therapeutic activities;Therapeutic exercise;Balance training;Patient/family education   PT Goals Acute Rehab PT Goals PT Goal Formulation: With patient Time For Goal Achievement: 05/26/12 Potential to Achieve Goals: Good Pt will go Supine/Side to Sit: with supervision PT Goal: Supine/Side to Sit - Progress: Goal set today Pt will go Sit to Supine/Side: with supervision PT Goal: Sit to Supine/Side - Progress: Progressing toward goal Pt will go Sit to Stand: with supervision PT Goal: Sit to Stand - Progress: Progressing toward goal Pt will go Stand to Sit: with supervision PT Goal: Stand to Sit - Progress: Progressing toward goal Pt will Ambulate: >150 feet;with supervision;with least restrictive assistive device PT Goal: Ambulate - Progress: Progressing toward goal  Visit Information  Last PT Received On: 05/23/12 Assistance Needed: +1    Subjective Data  Subjective: My hip burns where the incision is Patient Stated Goal: to return home   Cognition  Overall Cognitive Status: Appears within functional limits for tasks assessed/performed Arousal/Alertness: Awake/alert Orientation Level: Appears intact for tasks assessed Behavior During Session: [facility] for tasks performed    Balance     End of Session PT - End of Session Activity Tolerance: Patient tolerated treatment well;Other (comment) Patient left: in bed;with call  bell/phone within reach Nurse Communication: Mobility status    Page, Celena Rodberg 05/23/2012, 3:31 PM

## 2012-05-23 NOTE — Progress Notes (Signed)
Subjective: 1 Day Post-Op Procedure(s) (LRB): TOTAL HIP ARTHROPLASTY (Left) Patient reports pain as mild.   Patient is well, and has had no acute complaints or problems We will start therapy today.  Plan is to go Home  Vs SNFafter hospital stay depending on progress.  Objective: Vital signs in last 24 hours: Temp:  [97.4 F (36.3 C)-98.4 F (36.9 C)] 98.1 F (36.7 C) (06/08 0623) Pulse Rate:  [55-65] 61  (06/08 0623) Resp:  [10-20] 18  (06/08 0623) BP: (92-135)/(54-63) 92/54 mmHg (06/08 0623) SpO2:  [99 %-100 %] 100 % (06/08 0623) Weight:  [60.328 kg (133 lb)] 60.328 kg (133 lb) (06/07 1218)  Intake/Output from previous day:  Intake/Output Summary (Last 24 hours) at 05/23/12 0804 Last data filed at 05/23/12 [id number]  Gross per 24 hour  Intake 3046.25 ml  Output   2575 ml  Net 471.25 ml    Intake/Output this shift:    Labs:  Basename 05/23/12 0405 05/20/12 1345  HGB 8.4* 13.8    Basename 05/23/12 0405 05/20/12 1345  WBC 12.4* 9.3  RBC 2.86* 4.64  HCT 25.0* 40.0  PLT 122* 187    Basename 05/23/12 0405 05/20/12 1345  NA 137 139  K 4.1 4.4  CL 107 105  CO2 23 23  BUN 19 22  CREATININE 0.82 0.89  GLUCOSE 152* 91  CALCIUM 8.5 10.0    Basename 05/20/12 1345  LABPT --  INR 0.92    EXAM General - Patient is Alert, Appropriate and Oriented Extremity - Neurologically intact Dorsiflexion/Plantar flexion intact No cellulitis present Compartment soft Dressing - dressing C/D/I Motor Function - intact, moving foot and toes well on exam.  Hemovac pulled without difficulty.  Past Medical History  Diagnosis Date  . Hypertension   . Hyperlipidemia   . Thyroid disease   . Colon polyp   . Renal insufficiency   . Arthritis   . MVP (mitral valve prolapse)   . Hx of gout   . Hyperthyroidism     Assessment/Plan: 1 Day Post-Op Procedure(s) (LRB): TOTAL HIP ARTHROPLASTY (Left) Active Problems:  * No active hospital problems. *    Advance diet Up with  therapy Will transfuse 2 units PRBCs due to symptomatic (low BP) blood loss anemia  DVT Prophylaxis - Xarelto WBAT left Leg D/C Knee Immobilizer Hemovac Pulled Begin Therapy Hip Preacutions Keep foley until tomorrow. No vaccines.  Gearlean Alf 05/23/2012, 8:04 AM

## 2012-05-23 NOTE — Progress Notes (Signed)
Pt recommended for HHPT by PT.   CSW notified CM.  Pete Pelt, LCSWA [employer] Coverage [phone]

## 2012-05-23 NOTE — Evaluation (Signed)
Physical Therapy Evaluation Patient Details Name: [name] MRN: [id number] DOB: 05-May-1941 Today's Date: 05/23/2012 Time: [id number] PT Time Calculation (min): 27 min  PT Assessment / Plan / Recommendation Clinical Impression  Pt presents s/p L THR POD 1 (post) with decreased strength, ROM and mobility in LLE.  Tolerated ambulation in hallway very well with RW at min/guard assist.  Upon returning to room, pt stated she felt "fuzzy" and became briefly unreposonsive.  Tilted pt back in recliner and checked BP, was 95/57.  RN notified and pt to receive blood today.     PT Assessment  Patient needs continued PT services    Follow Up Recommendations  Home health PT    Barriers to Discharge None      lEquipment Recommendations  None recommended by PT    Recommendations for Other Services     Frequency 7X/week    Precautions / Restrictions Precautions Precautions: Posterior Hip Precaution Booklet Issued: Yes (comment) Precaution Comments: Provided handout and educated pt/family Restrictions Weight Bearing Restrictions: No LLE Weight Bearing: Weight bearing as tolerated   Pertinent Vitals/Pain 3/10      Mobility  Bed Mobility Bed Mobility: Supine to Sit;Sitting - Scoot to Edge of Bed Supine to Sit: 4: Min assist;HOB elevated Sitting - Scoot to [employer] of Bed: 4: Min guard Details for Bed Mobility Assistance: Min assist for LLE off of bed with cues for UE placement to self assist trunk.  Transfers Transfers: Sit to Stand;Stand to Sit Sit to Stand: 4: Min guard;From elevated surface;With upper extremity assist;From bed Stand to Sit: 4: Min guard;With upper extremity assist;With armrests;To chair/3-in-1 Details for Transfer Assistance: Min/guard for safety when sitting/standing with min cues for hand placement LLE management when sitting/standing to maintain hip precautions.  Ambulation/Gait Ambulation/Gait Assistance: 4: Min guard Ambulation Distance (Feet): 100 Feet Assistive  device: Rolling walker Ambulation/Gait Assistance Details: Cues for sequencing/technique with RW and to not step too far into RW.   Gait Pattern: Step-to pattern;Step-through pattern Gait velocity: decreased Stairs: No Wheelchair Mobility Wheelchair Mobility: No    Exercises     PT Diagnosis: Difficulty walking;Abnormality of gait;Generalized weakness;Acute pain  PT Problem List: Decreased strength;Decreased range of motion;Decreased activity tolerance;Decreased mobility;Decreased knowledge of precautions;Pain;Decreased knowledge of use of DME PT Treatment Interventions: DME instruction;Gait training;Functional mobility training;Therapeutic activities;Therapeutic exercise;Balance training;Patient/family education   PT Goals Acute Rehab PT Goals PT Goal Formulation: With patient Time For Goal Achievement: 05/26/12 Potential to Achieve Goals: Good Pt will go Supine/Side to Sit: with supervision PT Goal: Supine/Side to Sit - Progress: Goal set today Pt will go Sit to Supine/Side: with supervision PT Goal: Sit to Supine/Side - Progress: Goal set today Pt will go Sit to Stand: with supervision PT Goal: Sit to Stand - Progress: Goal set today Pt will go Stand to Sit: with supervision PT Goal: Stand to Sit - Progress: Goal set today Pt will Ambulate: >150 feet;with supervision;with least restrictive assistive device PT Goal: Ambulate - Progress: Goal set today  Visit Information  Last PT Received On: 05/23/12 Assistance Needed: +1 PT/OT Co-Evaluation/Treatment: Yes    Subjective Data  Subjective: I want to go home Patient Stated Goal: to return home   Prior Functioning  Home Living Lives With:  (niece) Available Help at Discharge: Family;Available PRN/intermittently Type of Home: House Home Access: Level entry Home Layout: One level Bathroom Shower/Tub: Chiropodist: Standard Home Adaptive Equipment: Shower chair with back;Bedside commode/3-in-1;Sock  aid;Long-handled shoehorn;Reacher;Long-handled sponge;Walker - rolling;Straight cane Prior Function Level of  Independence: Independent with assistive device(s) Able to Take Stairs?: Yes Driving: Yes Vocation: Retired Corporate investment banker: No difficulties Dominant Hand: Right    Cognition  Overall Cognitive Status: Appears within functional limits for tasks assessed/performed Arousal/Alertness: Awake/alert Orientation Level: Appears intact for tasks assessed Behavior During Session: [facility] for tasks performed    Extremity/Trunk Assessment Right Lower Extremity Assessment RLE ROM/Strength/Tone: WFL for tasks assessed RLE Sensation: WFL - Light Touch RLE Coordination: WFL - gross motor Left Lower Extremity Assessment LLE ROM/Strength/Tone: Deficits LLE ROM/Strength/Tone Deficits: ankle motions WFL, able to raise leg slightly against gravity LLE Sensation: WFL - Light Touch LLE Coordination: WFL - gross motor Trunk Assessment Trunk Assessment: Normal   Balance    End of Session PT - End of Session Activity Tolerance: Patient tolerated treatment well;Other (comment) (had episode of low BP following amb.) Patient left: in chair;with call bell/phone within reach;with family/visitor present Nurse Communication: Mobility status   Page, Betha Loa 05/23/2012, 11:40 AM

## 2012-05-24 LAB — TYPE AND SCREEN
ABO/RH(D): B POS
Antibody Screen: NEGATIVE
Unit division: 0
Unit division: 0

## 2012-05-24 LAB — BASIC METABOLIC PANEL WITH GFR
BUN: 23 mg/dL (ref 6–23)
CO2: 25 meq/L (ref 19–32)
Calcium: 8.4 mg/dL (ref 8.4–10.5)
Chloride: 105 meq/L (ref 96–112)
Creatinine, Ser: 0.86 mg/dL (ref 0.50–1.10)
GFR calc Af Amer: 77 mL/min — ABNORMAL LOW
GFR calc non Af Amer: 66 mL/min — ABNORMAL LOW
Glucose, Bld: 110 mg/dL — ABNORMAL HIGH (ref 70–99)
Potassium: 3.8 meq/L (ref 3.5–5.1)
Sodium: 136 meq/L (ref 135–145)

## 2012-05-24 LAB — CBC
HCT: 31 % — ABNORMAL LOW (ref 36.0–46.0)
Hemoglobin: 10.6 g/dL — ABNORMAL LOW (ref 12.0–15.0)
MCH: 29 pg (ref 26.0–34.0)
MCHC: 34.2 g/dL (ref 30.0–36.0)
MCV: 84.7 fL (ref 78.0–100.0)
Platelets: 105 10*3/uL — ABNORMAL LOW (ref 150–400)
RBC: 3.66 MIL/uL — ABNORMAL LOW (ref 3.87–5.11)
RDW: 14.7 % (ref 11.5–15.5)
WBC: 10.7 10*3/uL — ABNORMAL HIGH (ref 4.0–10.5)

## 2012-05-24 MED ORDER — AMLODIPINE BESYLATE 5 MG PO TABS
5.0000 mg | ORAL_TABLET | Freq: Every day | ORAL | Status: DC
  Filled 2012-05-24: qty 1

## 2012-05-24 MED ORDER — ATENOLOL 50 MG PO TABS
50.0000 mg | ORAL_TABLET | Freq: Once | ORAL | Status: AC
  Administered 2012-05-24: 50 mg via ORAL
  Filled 2012-05-24: qty 1

## 2012-05-24 MED ORDER — ATENOLOL 50 MG PO TABS
50.0000 mg | ORAL_TABLET | Freq: Every day | ORAL | Status: DC
  Filled 2012-05-24: qty 1

## 2012-05-24 MED ORDER — AMLODIPINE BESYLATE 10 MG PO TABS
10.0000 mg | ORAL_TABLET | Freq: Once | ORAL | Status: AC
  Administered 2012-05-24: 10 mg via ORAL
  Filled 2012-05-24: qty 1

## 2012-05-24 NOTE — Progress Notes (Signed)
Subjective: 2 Days Post-Op Procedure(s) (LRB): TOTAL HIP ARTHROPLASTY (Left) Patient reports pain as mild.   Patient seen in rounds with Dr. Gladstone Lighter. Patient is well, and has had no acute complaints or problems. She denies chest pain and shortness of breath. She reports that therapy is going well. She was able to sleep well last night. Patient continuing to debate SNF vs home after hospital stay. She wants to see how she progresses today.   Objective: Vital signs in last 24 hours: Temp:  [97.5 F (36.4 C)-98.9 F (37.2 C)] 98.4 F (36.9 C) (06/09 0620) Pulse Rate:  [68-86] 84  (06/09 0620) Resp:  [16-20] 18  (06/09 0800) BP: (101-127)/(56-66) 105/61 mmHg (06/09 0620) SpO2:  [94 %-99 %] 96 % (06/09 0800)  Intake/Output from previous day:  Intake/Output Summary (Last 24 hours) at 05/24/12 0816 Last data filed at 05/24/12 0620  Gross per 24 hour  Intake 4110.78 ml  Output   3000 ml  Net 1110.78 ml     Labs:  Basename 05/24/12 0527 05/23/12 0405  HGB 10.6* 8.4*    Basename 05/24/12 0527 05/23/12 0405  WBC 10.7* 12.4*  RBC 3.66* 2.86*  HCT 31.0* 25.0*  PLT 105* 122*    Basename 05/24/12 0527 05/23/12 0405  NA 136 137  K 3.8 4.1  CL 105 107  CO2 25 23  BUN 23 19  CREATININE 0.86 0.82  GLUCOSE 110* 152*  CALCIUM 8.4 8.5    EXAM General - Patient is Alert and Oriented Extremity - Neurologically intact Neurovascular intact Dorsiflexion/Plantar flexion intact Dressing/Incision - clean, dry, no drainage. Dressing changed Motor Function - intact, moving foot and toes well on exam.   Past Medical History  Diagnosis Date  . Hypertension   . Hyperlipidemia   . Thyroid disease   . Colon polyp   . Renal insufficiency   . Arthritis   . MVP (mitral valve prolapse)   . Hx of gout   . Hyperthyroidism     Assessment/Plan: 2 Days Post-Op Procedure(s) (LRB): TOTAL HIP ARTHROPLASTY (Left)   Advance diet Up with therapy Plan for discharge tomorrow DC  foley  DVT Prophylaxis - Xarelto and TED hose Weight-Bearing as tolerated to left leg  [name] [name] 05/24/2012, 8:16 AM

## 2012-05-24 NOTE — Progress Notes (Signed)
Cm spoke with pt concerning dc planning. Per pt choice [facility] to provide [facility] services upon discharge. Pt has access to DME. Per pt' family to assist in home care. No other needs stated.    Arlean Hopping [phone]

## 2012-05-24 NOTE — Progress Notes (Signed)
Physical Therapy Treatment Patient Details Name: [name] MRN: [id number] DOB: 1941-08-31 Today's Date: 05/24/2012 Time: [id number] PT Time Calculation (min): 22 min  PT Assessment / Plan / Recommendation Comments on Treatment Session  Pt continues to progress very well with ambulation, increasing the distance this am. She does have some increased soreness.  RN made aware    Follow Up Recommendations  Home health PT    Barriers to Discharge        Equipment Recommendations  None recommended by PT    Recommendations for Other Services    Frequency 7X/week   Plan Discharge plan remains appropriate    Precautions / Restrictions Precautions Precautions: Posterior Hip Precaution Booklet Issued: Yes (comment) Precaution Comments: able to state 3/3 precautions Restrictions Weight Bearing Restrictions: No LLE Weight Bearing: Weight bearing as tolerated   Pertinent Vitals/Pain 7/10    Mobility  Bed Mobility Bed Mobility: Supine to Sit;Sitting - Scoot to Edge of Bed Supine to Sit: 4: Min assist;HOB elevated (HOB elevated slightly) Sitting - Scoot to Edge of Bed: 5: Supervision Details for Bed Mobility Assistance: Very min assist for LLE out of bed with cues for hand placement on bed instead of rails to better simulate home environment.  Transfers Transfers: Sit to Stand;Stand to Sit Sit to Stand: 4: Min guard;From elevated surface;With upper extremity assist;From bed Stand to Sit: 5: Supervision;With upper extremity assist;With armrests;To chair/3-in-1 Details for Transfer Assistance: Min/guard for safety and balance when standing with cues for hand placement and LE managment when sitting/standing.  Ambulation/Gait Ambulation/Gait Assistance: 5: Supervision Ambulation Distance (Feet): 150 Feet Assistive device: Rolling walker Ambulation/Gait Assistance Details: Cues for sequencing/technique, upright posture and turning towards stronger side.  Gait Pattern: Step-to  pattern;Step-through pattern Gait velocity: decreased    Exercises     PT Diagnosis:    PT Problem List:   PT Treatment Interventions:     PT Goals Acute Rehab PT Goals PT Goal Formulation: With patient Time For Goal Achievement: 05/26/12 Potential to Achieve Goals: Good Pt will go Supine/Side to Sit: with supervision PT Goal: Supine/Side to Sit - Progress: Progressing toward goal Pt will go Sit to Stand: with supervision PT Goal: Sit to Stand - Progress: Progressing toward goal Pt will go Stand to Sit: with supervision PT Goal: Stand to Sit - Progress: Progressing toward goal Pt will Ambulate: 51 - 150 feet;with modified independence;with least restrictive assistive device PT Goal: Ambulate - Progress: Updated due to goal met  Visit Information  Last PT Received On: 05/24/12 Assistance Needed: +1    Subjective Data  Subjective: My hip is just really sore Patient Stated Goal: to return home   Cognition  Overall Cognitive Status: Appears within functional limits for tasks assessed/performed Arousal/Alertness: Awake/alert Orientation Level: Appears intact for tasks assessed Behavior During Session: [facility] for tasks performed    Balance     End of Session PT - End of Session Activity Tolerance: Patient tolerated treatment well Patient left: in chair;with call bell/phone within reach Nurse Communication: Mobility status    Page, Betha Loa 05/24/2012, 9:46 AM

## 2012-05-24 NOTE — Progress Notes (Signed)
Physical Therapy Treatment Patient Details Name: [name] MRN: [id number] DOB: 03/22/1941 Today's Date: 05/24/2012 Time: [id number] PT Time Calculation (min): 23 min  PT Assessment / Plan / Recommendation Comments on Treatment Session  Pt progressing very well with ambulation and exercises.  Pt to D/C tomorrow.     Follow Up Recommendations  Home health PT    Barriers to Discharge        Equipment Recommendations  None recommended by PT    Recommendations for Other Services    Frequency 7X/week   Plan Discharge plan remains appropriate    Precautions / Restrictions Precautions Precautions: Posterior Hip Precaution Booklet Issued: Yes (comment) Restrictions Weight Bearing Restrictions: No LLE Weight Bearing: Weight bearing as tolerated   Pertinent Vitals/Pain 3/10    Mobility  Bed Mobility Bed Mobility: Sit to Supine Sit to Supine: 4: Min assist Details for Bed Mobility Assistance: Assist for LLE into bed with min cues for adjusting hips once in bed.  Transfers Transfers: Sit to Stand;Stand to Sit Sit to Stand: 4: Min guard;With upper extremity assist;With armrests;From chair/3-in-1 Stand to Sit: 5: Supervision;To bed;With upper extremity assist Details for Transfer Assistance: Min cues for hand placement and LE management.  Pt does well maintaining hip precautions when sitting/standing.  Ambulation/Gait Ambulation/Gait Assistance: 5: Supervision Ambulation Distance (Feet): 120 Feet Assistive device: Rolling walker Ambulation/Gait Assistance Details: Cues for not stepping too far into RW, maintaining left foot ahead (not toe-ing in) and for upright posture.  Gait Pattern: Step-to pattern;Step-through pattern Gait velocity: decreased    Exercises Total Joint Exercises Ankle Circles/Pumps: AROM;Both;20 reps Quad Sets: AROM;Left;10 reps Short Arc Quad: AROM;Left;10 reps Heel Slides: AAROM;Left;10 reps Hip ABduction/ADduction: AAROM;Left;10 reps   PT Diagnosis:      PT Problem List:   PT Treatment Interventions:     PT Goals Acute Rehab PT Goals PT Goal Formulation: With patient Time For Goal Achievement: 05/26/12 Potential to Achieve Goals: Good Pt will go Sit to Supine/Side: with supervision PT Goal: Sit to Supine/Side - Progress: Progressing toward goal Pt will go Sit to Stand: with supervision PT Goal: Sit to Stand - Progress: Progressing toward goal Pt will go Stand to Sit: with supervision PT Goal: Stand to Sit - Progress: Met Pt will Ambulate: 51 - 150 feet;with modified independence;with least restrictive assistive device PT Goal: Ambulate - Progress: Progressing toward goal  Visit Information  Last PT Received On: 05/24/12 Assistance Needed: +1    Subjective Data  Subjective: My pain is ok Patient Stated Goal: to return home   Cognition  Overall Cognitive Status: Appears within functional limits for tasks assessed/performed Arousal/Alertness: Awake/alert Orientation Level: Appears intact for tasks assessed Behavior During Session: [facility] for tasks performed    Balance     End of Session PT - End of Session Activity Tolerance: Patient tolerated treatment well Patient left: in bed;with call bell/phone within reach Nurse Communication: Mobility status    Page, Betha Loa 05/24/2012, 4:33 PM

## 2012-05-25 LAB — CBC
HCT: 31.2 % — ABNORMAL LOW (ref 36.0–46.0)
Hemoglobin: 10.7 g/dL — ABNORMAL LOW (ref 12.0–15.0)
MCH: 29.2 pg (ref 26.0–34.0)
MCHC: 34.3 g/dL (ref 30.0–36.0)
MCV: 85.2 fL (ref 78.0–100.0)
Platelets: 119 10*3/uL — ABNORMAL LOW (ref 150–400)
RBC: 3.66 MIL/uL — ABNORMAL LOW (ref 3.87–5.11)
RDW: 14.4 % (ref 11.5–15.5)
WBC: 9.7 10*3/uL (ref 4.0–10.5)

## 2012-05-25 MED ORDER — RIVAROXABAN 10 MG PO TABS: 10.0000 mg | ORAL_TABLET | Freq: Every day | ORAL | Status: DC

## 2012-05-25 MED ORDER — OXYCODONE HCL 5 MG PO TABS: 5.0000 mg | ORAL_TABLET | ORAL | Status: AC | PRN

## 2012-05-25 MED ORDER — METHOCARBAMOL 500 MG PO TABS: 500.0000 mg | ORAL_TABLET | Freq: Four times a day (QID) | ORAL | Status: AC | PRN

## 2012-05-25 NOTE — Progress Notes (Signed)
Physical Therapy Treatment Patient Details Name: [name] MRN: [id number] DOB: Dec 15, 1941 Today's Date: 05/25/2012 Time: [id number] PT Time Calculation (min): 25 min  PT Assessment / Plan / Recommendation Comments on Treatment Session  Pt plans to D/c to home today.  Amb in hallway.  Pt recalls 3/3 THP and is aware she is WBAT.  Pt given handout on THR TE's HEP and instructed on frequency.  Pt instructed on use of ICE.    Follow Up Recommendations  Home health PT    Barriers to Discharge        Equipment Recommendations       Recommendations for Other Services    Frequency 7X/week   Plan Discharge plan remains appropriate    Precautions / Restrictions   THP WBAT  Pertinent Vitals/Pain C/o "soreness" Applied ICE    Mobility  Bed Mobility Bed Mobility: Supine to Sit Supine to Sit: 4: Min guard Details for Bed Mobility Assistance: Assist for LLE into bed with min cues for adjusting hips once in bed. Transfers Transfers: Sit to Stand;Stand to Sit Sit to Stand: 5: Supervision Stand to Sit: 5: Supervision Details for Transfer Assistance: Good hand placement  Ambulation/Gait Ambulation/Gait Assistance: 5: Supervision Ambulation Distance (Feet): 85 Feet Assistive device: Rolling walker Ambulation/Gait Assistance Details: Cues for not stepping too far into RW, maintaining left foot ahead (not toe-ing in) and for upright posture Gait Pattern: Step-to pattern;Decreased stride length;Decreased stance time - left Gait velocity: slow Stairs: No Wheelchair Mobility Wheelchair Mobility: No     PT Goals                 progressing    Visit Information  Last PT Received On: 05/25/12 Assistance Needed: +1               Balance   good with RW  End of Session PT - End of Session Equipment Utilized During Treatment: Gait belt Activity Tolerance: Patient tolerated treatment well Patient left: in chair;with call bell/phone within reach Nurse Communication: Other  (comment) (pt ready for D/C to home)   Rica Koyanagi  PTA [facility]  Acute  Rehab Pager     [phone]

## 2012-05-25 NOTE — Progress Notes (Signed)
Subjective: 3 Days Post-Op Procedure(s) (LRB): TOTAL HIP ARTHROPLASTY (Left) Patient reports pain as mild.   Patient seen in rounds with Dr. Wynelle Link. Patient is well, and has had no acute complaints or problems Patient is ready to go home.  Objective: Vital signs in last 24 hours: Temp:  [98.1 F (36.7 C)-100 F (37.8 C)] 98.1 F (36.7 C) (06/10 0500) Pulse Rate:  [72-153] 72  (06/10 0500) Resp:  [18] 18  (06/10 0500) BP: (101-153)/(56-80) 101/56 mmHg (06/10 0500) SpO2:  [95 %-96 %] 96 % (06/10 0500)  Intake/Output from previous day:  Intake/Output Summary (Last 24 hours) at 05/25/12 0757 Last data filed at 05/25/12 0600  Gross per 24 hour  Intake    960 ml  Output   1900 ml  Net   -940 ml     Labs:  Basename 05/25/12 0425 05/24/12 0527 05/23/12 0405  HGB 10.7* 10.6* 8.4*    Basename 05/25/12 0425 05/24/12 0527  WBC 9.7 10.7*  RBC 3.66* 3.66*  HCT 31.2* 31.0*  PLT 119* 105*    Basename 05/24/12 0527 05/23/12 0405  NA 136 137  K 3.8 4.1  CL 105 107  CO2 25 23  BUN 23 19  CREATININE 0.86 0.82  GLUCOSE 110* 152*  CALCIUM 8.4 8.5   No results found for this basename: LABPT:2,INR:2 in the last 72 hours  EXAM: General - Patient is Alert, Appropriate and Oriented Extremity - Neurovascular intact Sensation intact distally Incision - clean, dry, no drainage, healing Motor Function - intact, moving foot and toes well on exam.   Assessment/Plan: 3 Days Post-Op Procedure(s) (LRB): TOTAL HIP ARTHROPLASTY (Left) Procedure(s) (LRB): TOTAL HIP ARTHROPLASTY (Left) Past Medical History  Diagnosis Date  . Hypertension   . Hyperlipidemia   . Thyroid disease   . Colon polyp   . Renal insufficiency   . Arthritis   . MVP (mitral valve prolapse)   . Hx of gout   . Hyperthyroidism    Active Problems:  * No active hospital problems. *    Discharge home with home health Diet - Cardiac diet Follow up - in 2 weeks Activity - WBAT Disposition - Home Condition  Upon Discharge - Good D/C Meds - See DC Summary DVT Prophylaxis - Xarelto  [name] 05/25/2012, 7:57 AM

## 2012-05-25 NOTE — Progress Notes (Signed)
Occupational Therapy Treatment from 6/9 by Darci Current, [place].Marland Kitchenlate entry Patient Details Name: [name] MRN: [id number] DOB: 03/28/1941 Today's Date: 05/25/2012 Time:  -     OT Assessment / Plan / Recommendation Comments on Treatment Session      Follow Up Recommendations       Barriers to Discharge       Equipment Recommendations       Recommendations for Other Services    Frequency     Plan      Precautions / Restrictions Precautions Precautions: Posterior Hip Precaution Comments: able to recall 2/3 precuations. Educated pt on no turning toes inward. Pt with tendency to do so in the bed- educated pt how to place pillow between legs to prevent this. Restrictions LLE Weight Bearing: Weight bearing as tolerated   Pertinent Vitals/Pain       ADL  Lower Body Bathing: Simulated;Supervision/safety Where Assessed - Lower Body Bathing: Unsupported sit to stand Toilet Transfer: Simulated;Supervision/safety Toilet Transfer Method: Sit to Loss adjuster, chartered: Therapist, occupational and Hygiene: Simulated;Supervision/safety Where Assessed - Best boy and Hygiene: Standing Tub/Shower Transfer: Simulated;Min guard Tub/Shower Transfer Method: Ambulating Equipment Used: Rolling walker Transfers/Ambulation Related to ADLs: Pt supervision with RW ambulation ~32ft ADL Comments: Pt doing well today with no reports of dizziness. Pt demonstrated knowledge of use of AE and safe tub/shower transfer.    OT Diagnosis:    OT Problem List:   OT Treatment Interventions:     OT Goals    Visit Information  Assistance Needed: +1    Subjective Data      Prior Functioning       Cognition  Overall Cognitive Status: Appears within functional limits for tasks assessed/performed Arousal/Alertness: Awake/alert Orientation Level: Appears intact for tasks assessed Behavior During Session: [facility] for tasks performed    Mobility  Bed Mobility Bed Mobility: Supine to Sit;Sit to Supine Supine to Sit: 4: Min assist;HOB flat;With rails Sit to Supine: 4: Min assist;HOB flat Details for Bed Mobility Assistance: Assist for LLE into bed with min cues for adjusting hips once in bed.    Exercises    Balance    End of Session tx by Darci Current, OT 05/24/12     [name] 05/25/2012, 7:04 AM

## 2012-05-25 NOTE — Discharge Instructions (Signed)
Dr. Gaynelle Arabian Total Joint Specialist [facility] [address], [place], [place] [address] [phone]   TOTAL HIP REPLACEMENT POSTOPERATIVE DIRECTIONS    Hip Rehabilitation, Guidelines Following Surgery  The results of a hip operation are greatly improved after range of motion and muscle strengthening exercises. Follow all safety measures which are given to protect your hip. If any of these exercises cause increased pain or swelling in your joint, decrease the amount until you are comfortable again. Then slowly increase the exercises. Call your caregiver if you have problems or questions.  HOME CARE INSTRUCTIONS  Most of the following instructions are designed to prevent the dislocation of your new hip.  Remove items at home which could result in a fall. This includes throw rugs or furniture in walking pathways.  Continue medications as instructed at time of discharge.  You may have some home medications which will be placed on hold until you complete the course of blood thinner medication.  You may start showering once you are discharged home but do not submerge the incision under water. Just pat the incision dry and apply a dry gauze dressing on daily. Do not put on socks or shoes without following the instructions of your caregivers.  Sit on high chairs so your hips are not bent more than 90 degrees.  Sit on chairs with arms. Use the chair arms to help push yourself up when arising.  Keep your leg on the side of the operation out in front of you when standing up.  Arrange for the use of a toilet seat elevator so you are not sitting low.  Do not do any exercises or get in any positions that cause your toes to point in (pigeon toed).  Always sleep with a pillow between your legs. Do not lie on your side in sleep with both knees touching the bed.   Walk with walker as instructed.  You may resume a sexual relationship in one month or when given the OK by  your caregiver.  Use walker as long as suggested by your caregivers.  Begin partial weight bearing after surgery but do not advance weight bearing status until approved by your surgeon.  Avoid periods of inactivity such as sitting longer than an hour when not asleep. This helps prevent blood clots.  You may return to work once you are cleared by Engineer, production.  Do not drive a car for 6 weeks or until released by your surgeon.  Do not drive while taking narcotics.  Wear elastic stockings for three weeks following surgery during the day but you may remove then at night.  Make sure you keep all of your appointments after your operation with all of your doctors and caregivers.  Change the dressing daily and reapply a dry dressing each time. Please pick up a stool softener and laxative for home use as long as you are requiring pain medications.  Continue to use ice on the hip for pain and swelling from surgery. You may notice swelling that will progress down to the foot and ankle.  This is normal after  surgery.  Elevate the leg when you are not up walking on it.   It is important for you to complete the blood thinner medication as prescribed by your doctor.  Continue to use the breathing machine which will help keep your temperature  down.  It is common for your temperature to cycle up and down following surgery, especially at night when you are  not up moving around and exerting yourself.  The breathing machine keeps your lungs expanded and your temperature down.  RANGE OF MOTION AND STRENGTHENING EXERCISES  These exercises are designed to help you keep full movement of your hip joint. Follow your caregiver's or physical therapist's instructions. Perform all exercises about fifteen times, three times per day or as directed. Exercise both hips, even if you have had only one joint replacement. These exercises can be done on a training (exercise) mat, on the floor, on a table or on a bed. Use whatever  works the best and is most comfortable for you. Use music or television while you are exercising so that the exercises are a pleasant break in your day. This will make your life better with the exercises acting as a break in routine you can look forward to.  Lying on your back, slowly slide your foot toward your buttocks, raising your knee up off the floor. Then slowly slide your foot back down until your leg is straight again.  Lying on your back spread your legs as far apart as you can without causing discomfort.  Lying on your side, raise your upper leg and foot straight up from the floor as far as is comfortable. Slowly lower the leg and repeat.  Lying on your back, tighten up the muscle in the front of your thigh (quadriceps muscles). You can do this by keeping your leg straight and trying to raise your heel off the floor. This helps strengthen the largest muscle supporting your knee.  Lying on your back, tighten up the muscles of your buttocks both with the legs straight and with the knee bent at a comfortable angle while keeping your heel on the floor.   SKILLED REHAB INSTRUCTIONS: If the patient is transferred to a skilled rehab facility following release from the hospital, a list of the current medications will be sent to the facility for the patient to continue.  When discharged from the skilled rehab facility, please have the facility set up the patient's [place] prior to being released. Also, the skilled facility will be responsible for providing the patient with their medications at time of release from the facility to include their pain medication, the muscle relaxants, and their blood thinner medication. If the patient is still at the rehab facility at time of the two week follow up appointment, the skilled rehab facility will also need to assist the patient in arranging follow up appointment in our office and any transportation needs.  MAKE SURE YOU:  Understand these  instructions.  Will watch your condition.  Will get help right away if you are not doing well or get worse.  Document Released: 07/05/2004 Document Revised: 11/21/2011 Document Reviewed: 06/22/2008  [facility] Patient Information 2012 [place].   Take Xarelto for two and a half more weeks, then discontinue Xarelto.

## 2012-05-25 NOTE — Progress Notes (Signed)
CARE MANAGEMENT NOTE 05/25/2012  Patient:  [name], [name]   Account Number:  [id number]  Date Initiated:  05/24/2012  Documentation initiated by:  DAVIS,TYMEEKA  Subjective/Objective Assessment:   47 female admitted s/p TOTAL HIP ARTHROPLASTY (Left).     Action/Plan:   Home when stable   Anticipated DC Date:  05/25/2012   Anticipated DC Plan:  [place] referral  NA      DC Planning Services  CM consult      [facility] Choice  HOME HEALTH   Choice offered to / List presented to:  C-1 Patient   DME arranged  NA      DME agency  NA     [place] arranged  HH-2 PT      [place]   Status of service:  Completed, signed off Medicare Important Message given?  NA - LOS <3 / Initial given by admissions (If response is "NO", the following Medicare IM given date fields will be blank) Date Medicare IM given:   Date Additional Medicare IM given:    Discharge Disposition:  [place]  Comments:  05/25/2012 Ndidi Nesby,RN BSN CCM [phone] Pt for discharge. [place] will start tomorrow 05/26/2012

## 2012-05-26 ENCOUNTER — Encounter (HOSPITAL_COMMUNITY): Payer: Self-pay | Admitting: Orthopedic Surgery

## 2012-05-27 DIAGNOSIS — K8689 Other specified diseases of pancreas: Secondary | ICD-10-CM | POA: Diagnosis not present

## 2012-05-27 DIAGNOSIS — F329 Major depressive disorder, single episode, unspecified: Secondary | ICD-10-CM | POA: Diagnosis not present

## 2012-05-27 DIAGNOSIS — M169 Osteoarthritis of hip, unspecified: Secondary | ICD-10-CM | POA: Diagnosis not present

## 2012-05-27 DIAGNOSIS — Z7901 Long term (current) use of anticoagulants: Secondary | ICD-10-CM | POA: Diagnosis not present

## 2012-05-27 DIAGNOSIS — Z471 Aftercare following joint replacement surgery: Secondary | ICD-10-CM | POA: Diagnosis not present

## 2012-05-27 DIAGNOSIS — IMO0001 Reserved for inherently not codable concepts without codable children: Secondary | ICD-10-CM | POA: Diagnosis not present

## 2012-05-29 DIAGNOSIS — K8689 Other specified diseases of pancreas: Secondary | ICD-10-CM | POA: Diagnosis not present

## 2012-05-29 DIAGNOSIS — Z471 Aftercare following joint replacement surgery: Secondary | ICD-10-CM | POA: Diagnosis not present

## 2012-05-29 DIAGNOSIS — IMO0001 Reserved for inherently not codable concepts without codable children: Secondary | ICD-10-CM | POA: Diagnosis not present

## 2012-05-29 DIAGNOSIS — F329 Major depressive disorder, single episode, unspecified: Secondary | ICD-10-CM | POA: Diagnosis not present

## 2012-05-29 DIAGNOSIS — Z7901 Long term (current) use of anticoagulants: Secondary | ICD-10-CM | POA: Diagnosis not present

## 2012-06-01 DIAGNOSIS — F329 Major depressive disorder, single episode, unspecified: Secondary | ICD-10-CM | POA: Diagnosis not present

## 2012-06-01 DIAGNOSIS — Z7901 Long term (current) use of anticoagulants: Secondary | ICD-10-CM | POA: Diagnosis not present

## 2012-06-01 DIAGNOSIS — IMO0001 Reserved for inherently not codable concepts without codable children: Secondary | ICD-10-CM | POA: Diagnosis not present

## 2012-06-01 DIAGNOSIS — K8689 Other specified diseases of pancreas: Secondary | ICD-10-CM | POA: Diagnosis not present

## 2012-06-01 DIAGNOSIS — Z471 Aftercare following joint replacement surgery: Secondary | ICD-10-CM | POA: Diagnosis not present

## 2012-06-03 DIAGNOSIS — IMO0001 Reserved for inherently not codable concepts without codable children: Secondary | ICD-10-CM | POA: Diagnosis not present

## 2012-06-03 DIAGNOSIS — Z7901 Long term (current) use of anticoagulants: Secondary | ICD-10-CM | POA: Diagnosis not present

## 2012-06-03 DIAGNOSIS — F329 Major depressive disorder, single episode, unspecified: Secondary | ICD-10-CM | POA: Diagnosis not present

## 2012-06-03 DIAGNOSIS — Z471 Aftercare following joint replacement surgery: Secondary | ICD-10-CM | POA: Diagnosis not present

## 2012-06-03 DIAGNOSIS — K8689 Other specified diseases of pancreas: Secondary | ICD-10-CM | POA: Diagnosis not present

## 2012-06-04 DIAGNOSIS — Z471 Aftercare following joint replacement surgery: Secondary | ICD-10-CM | POA: Diagnosis not present

## 2012-06-04 DIAGNOSIS — F329 Major depressive disorder, single episode, unspecified: Secondary | ICD-10-CM | POA: Diagnosis not present

## 2012-06-04 DIAGNOSIS — Z7901 Long term (current) use of anticoagulants: Secondary | ICD-10-CM | POA: Diagnosis not present

## 2012-06-04 DIAGNOSIS — IMO0001 Reserved for inherently not codable concepts without codable children: Secondary | ICD-10-CM | POA: Diagnosis not present

## 2012-06-04 DIAGNOSIS — K8689 Other specified diseases of pancreas: Secondary | ICD-10-CM | POA: Diagnosis not present

## 2012-06-09 DIAGNOSIS — Z7901 Long term (current) use of anticoagulants: Secondary | ICD-10-CM | POA: Diagnosis not present

## 2012-06-09 DIAGNOSIS — Z471 Aftercare following joint replacement surgery: Secondary | ICD-10-CM | POA: Diagnosis not present

## 2012-06-09 DIAGNOSIS — IMO0001 Reserved for inherently not codable concepts without codable children: Secondary | ICD-10-CM | POA: Diagnosis not present

## 2012-06-09 DIAGNOSIS — F329 Major depressive disorder, single episode, unspecified: Secondary | ICD-10-CM | POA: Diagnosis not present

## 2012-06-09 DIAGNOSIS — K8689 Other specified diseases of pancreas: Secondary | ICD-10-CM | POA: Diagnosis not present

## 2012-06-11 DIAGNOSIS — Z471 Aftercare following joint replacement surgery: Secondary | ICD-10-CM | POA: Diagnosis not present

## 2012-06-11 DIAGNOSIS — Z7901 Long term (current) use of anticoagulants: Secondary | ICD-10-CM | POA: Diagnosis not present

## 2012-06-11 DIAGNOSIS — K8689 Other specified diseases of pancreas: Secondary | ICD-10-CM | POA: Diagnosis not present

## 2012-06-11 DIAGNOSIS — IMO0001 Reserved for inherently not codable concepts without codable children: Secondary | ICD-10-CM | POA: Diagnosis not present

## 2012-06-11 DIAGNOSIS — F329 Major depressive disorder, single episode, unspecified: Secondary | ICD-10-CM | POA: Diagnosis not present

## 2012-06-17 DIAGNOSIS — F329 Major depressive disorder, single episode, unspecified: Secondary | ICD-10-CM | POA: Diagnosis not present

## 2012-06-17 DIAGNOSIS — Z471 Aftercare following joint replacement surgery: Secondary | ICD-10-CM | POA: Diagnosis not present

## 2012-06-17 DIAGNOSIS — K8689 Other specified diseases of pancreas: Secondary | ICD-10-CM | POA: Diagnosis not present

## 2012-06-17 DIAGNOSIS — IMO0001 Reserved for inherently not codable concepts without codable children: Secondary | ICD-10-CM | POA: Diagnosis not present

## 2012-06-17 DIAGNOSIS — Z7901 Long term (current) use of anticoagulants: Secondary | ICD-10-CM | POA: Diagnosis not present

## 2012-06-20 ENCOUNTER — Other Ambulatory Visit: Payer: Self-pay | Admitting: Internal Medicine

## 2012-06-22 NOTE — Discharge Summary (Signed)
Physician Discharge Summary   Patient ID: [name] MRN: [id number] DOB/AGE: Aug 17, 1941 71 y.o.  Admit date: 05/22/2012 Discharge date: 05/25/2012  Primary Diagnosis: Osteoarthritis Left Hip   Admission Diagnoses:  Past Medical History  Diagnosis Date  . Hypertension   . Hyperlipidemia   . Thyroid disease   . Colon polyp   . Renal insufficiency   . Arthritis   . MVP (mitral valve prolapse)   . Hx of gout   . Hyperthyroidism    Discharge Diagnoses:   Active Problems:  * No active hospital problems. *   Procedure: Procedure(s) (LRB): TOTAL HIP ARTHROPLASTY (Left)   Consults: None  HPI: [name] is a 71 y.o. female with end stage arthritis of her left hip with progressively worsening pain and dysfunction. Pain occurs with activity and rest including pain at night. She has tried analgesics, protected weight bearing and rest without benefit. Pain is too severe to attempt physical therapy. Radiographs demonstrate bone on bone arthritis with subchondral cyst formation, near ankylosis of the joint and massive circumferential osteophyte formation. She presents now for left THA.   Laboratory Data: Hospital Outpatient Visit on 05/20/2012  Component Date Value Range Status  . MRSA, PCR 05/20/2012 NEGATIVE  NEGATIVE Final  . Staphylococcus aureus 05/20/2012 NEGATIVE  NEGATIVE Final   Comment:                                 The Xpert SA Assay (FDA                          approved for NASAL specimens                          only), is one component of                          a comprehensive surveillance                          program.  It is not intended                          to diagnose infection nor to                          guide or monitor treatment.  Marland Kitchen aPTT 05/20/2012 30  24 - 37 seconds Final  . WBC 05/20/2012 9.3  4.0 - 10.5 K/uL Final  . RBC 05/20/2012 4.64  3.87 - 5.11 MIL/uL Final  . Hemoglobin 05/20/2012 13.8  12.0 - 15.0 g/dL Final  . HCT 05/20/2012  40.0  36.0 - 46.0 % Final  . MCV 05/20/2012 86.2  78.0 - 100.0 fL Final  . MCH 05/20/2012 29.7  26.0 - 34.0 pg Final  . MCHC 05/20/2012 34.5  30.0 - 36.0 g/dL Final  . RDW 05/20/2012 13.2  11.5 - 15.5 % Final  . Platelets 05/20/2012 187  150 - 400 K/uL Final  . Sodium 05/20/2012 139  135 - 145 mEq/L Final  . Potassium 05/20/2012 4.4  3.5 - 5.1 mEq/L Final  . Chloride 05/20/2012 105  96 - 112 mEq/L Final  . CO2 05/20/2012 23  19 - 32 mEq/L Final  . Glucose, Bld  05/20/2012 91  70 - 99 mg/dL Final  . BUN 05/20/2012 22  6 - 23 mg/dL Final  . Creatinine, Ser 05/20/2012 0.89  0.50 - 1.10 mg/dL Final  . Calcium 05/20/2012 10.0  8.4 - 10.5 mg/dL Final  . Total Protein 05/20/2012 6.4  6.0 - 8.3 g/dL Final  . Albumin 05/20/2012 3.8  3.5 - 5.2 g/dL Final  . AST 05/20/2012 46* 0 - 37 U/L Final  . ALT 05/20/2012 52* 0 - 35 U/L Final  . Alkaline Phosphatase 05/20/2012 112  39 - 117 U/L Final  . Total Bilirubin 05/20/2012 0.4  0.3 - 1.2 mg/dL Final  . GFR calc non Af Amer 05/20/2012 64* >90 mL/min Final  . GFR calc Af Amer 05/20/2012 74* >90 mL/min Final   Comment:                                 The eGFR has been calculated                          using the CKD EPI equation.                          This calculation has not been                          validated in all clinical                          situations.                          eGFR's persistently                          <90 mL/min signify                          possible Chronic Kidney Disease.  Marland Kitchen Prothrombin Time 05/20/2012 12.6  11.6 - 15.2 seconds Final  . INR 05/20/2012 0.92  0.00 - 1.49 Final  . Color, Urine 05/20/2012 YELLOW  YELLOW Final  . APPearance 05/20/2012 CLEAR  CLEAR Final  . Specific Gravity, Urine 05/20/2012 1.023  1.005 - 1.030 Final  . pH 05/20/2012 6.5  5.0 - 8.0 Final  . Glucose, UA 05/20/2012 NEGATIVE  NEGATIVE mg/dL Final  . Hgb urine dipstick 05/20/2012 NEGATIVE  NEGATIVE Final  . Bilirubin Urine  05/20/2012 NEGATIVE  NEGATIVE Final  . Ketones, ur 05/20/2012 NEGATIVE  NEGATIVE mg/dL Final  . Protein, ur 05/20/2012 NEGATIVE  NEGATIVE mg/dL Final  . Urobilinogen, UA 05/20/2012 1.0  0.0 - 1.0 mg/dL Final  . Nitrite 05/20/2012 NEGATIVE  NEGATIVE Final  . Leukocytes, UA 05/20/2012 NEGATIVE  NEGATIVE Final   MICROSCOPIC NOT DONE ON URINES WITH NEGATIVE PROTEIN, BLOOD, LEUKOCYTES, NITRITE, OR GLUCOSE <1000 mg/dL.  . ABO/RH(D) 05/20/2012 B POS   Final  . Antibody Screen 05/20/2012 NEG   Final  . Sample Expiration 05/20/2012 05/25/2012   Final  . Unit Number 05/20/2012 [id number]   Final  . Blood Component Type 05/20/2012 RED CELLS,LR   Final  . Unit division 05/20/2012 00   Final  . Status of Unit 05/20/2012 ISSUED,FINAL   Final  . Transfusion Status 05/20/2012 OK  TO TRANSFUSE   Final  . Crossmatch Result 05/20/2012 Compatible   Final  . Unit Number 05/20/2012 [id number]   Final  . Blood Component Type 05/20/2012 RED CELLS,LR   Final  . Unit division 05/20/2012 00   Final  . Status of Unit 05/20/2012 ISSUED,FINAL   Final  . Transfusion Status 05/20/2012 OK TO TRANSFUSE   Final  . Crossmatch Result 05/20/2012 Compatible   Final   No results found for this basename: HGB:5 in the last 72 hours No results found for this basename: WBC:2,RBC:2,HCT:2,PLT:2 in the last 72 hours No results found for this basename: NA:2,K:2,CL:2,CO2:2,BUN:2,CREATININE:2,GLUCOSE:2,CALCIUM:2 in the last 72 hours No results found for this basename: LABPT:2,INR:2 in the last 72 hours  X-Rays:No results found.  EKG: Orders placed during the hospital encounter of 01/15/12  . EKG     Hospital Course: Patient was admitted to [facility] and taken to the OR and underwent the above state procedure without complications.  Patient tolerated the procedure well and was later transferred to the recovery room and then to the orthopaedic floor for postoperative care.  They were given PO and IV analgesics for pain  control following their surgery.  They were given 24 hours of postoperative antibiotics and started on DVT prophylaxis in the form of Xarelto.   PT and OT were ordered for total hip protocol.  The patient was allowed to be PWB with therapy. Discharge planning was consulted to help with postop disposition and equipment needs.  Patient had a good night on the evening of surgery and started to get up OOB with therapy on day one.  Hemovac drain was pulled without difficulty.  The knee immobilizer was removed and discontinued.  Continued to work with therapy into day two.  Dressing was changed on day two and the incision was healing well.  By day three, the patient had progressed with therapy and meeting their goals.  Incision was healing well.  Patient was seen in rounds and was ready to go home.  Discharge Medications: Prior to Admission medications   Medication Sig Start Date End Date Taking? Authorizing Provider  allopurinol (ZYLOPRIM) 100 MG tablet Take 100 mg by mouth daily after breakfast.  06/11/11  Yes Elby Showers, MD  amLODipine (NORVASC) 10 MG tablet Take 10 mg by mouth daily after breakfast.  12/04/11  Yes Elby Showers, MD  atenolol (TENORMIN) 50 MG tablet Take 50 mg by mouth daily after breakfast.  12/31/11  Yes Elby Showers, MD  levothyroxine (SYNTHROID) 112 MCG tablet Take 112 mcg by mouth at bedtime.  10/02/11  Yes Elby Showers, MD  Pancrelipase, Lip-Prot-Amyl, (ZENPEP) 10000 UNITS CPEP Take 1-4 capsules by mouth 3 (three) times daily before meals.   Yes Historical Provider, MD  sertraline (ZOLOFT) 50 MG tablet Take 50 mg by mouth at bedtime.  06/11/11  Yes Elby Showers, MD  rivaroxaban (XARELTO) 10 MG TABS tablet Take 1 tablet (10 mg total) by mouth daily with breakfast. Take Xarelto for two and a half more weeks, then discontinue Xarelto. 05/25/12   Alexzandrew Dara Lords, PA    Diet: Cardiac diet Activity:WBAT No bending hip over 90 degrees- A "L" Angle Do not cross legs Do not let  foot roll inward When turning these patients a pillow should be placed between the patient's legs to prevent crossing. Patients should have the affected knee fully extended when trying to sit or stand from all surfaces to prevent excessive hip flexion. When ambulating and  turning toward the affected side the affected leg should have the toes turned out prior to moving the walker and the rest of patient's body as to prevent internal rotation/ turning in of the leg. Abduction pillows are the most effective way to prevent a patient from not crossing legs or turning toes in at rest. If an abduction pillow is not ordered placing a regular pillow length wise between the patient's legs is also an effective reminder. It is imperative that these precautions be maintained so that the surgical hip does not dislocate. Follow-up:in 2 weeks Disposition - Home Discharged Condition: good   Discharge Orders    Future Appointments: Provider: Department: Dept Phone: Center:   07/07/2012 9:05 AM Elby Showers, MD [facility] [phone] [place]   07/09/2012 2:00 PM Elby Showers, MD [facility] [phone] [facility]     Future Orders Please Complete By Expires   Diet - low sodium heart healthy      Call MD / Call 911      Comments:   If you experience chest pain or shortness of breath, CALL 911 and be transported to the hospital emergency room.  If you develope a fever above 101 F, pus (white drainage) or increased drainage or redness at the wound, or calf pain, call your surgeon's office.   Discharge instructions      Comments:   Pick up stool softner and laxative for home. Do not submerge incision under water. May shower. Continue to use ice for pain and swelling from surgery. Hip precautions.  Total Hip Protocol.  Take Xarelto for two and a half more weeks, then discontinue Xarelto.   Constipation Prevention      Comments:   Drink plenty of fluids.  Prune juice may be helpful.  You may use a  stool softener, such as Colace (over the counter) 100 mg twice a day.  Use MiraLax (over the counter) for constipation as needed.   Increase activity slowly as tolerated      Patient may shower      Comments:   You may shower without a dressing once there is no drainage.  Do not wash over the wound.  If drainage remains, do not shower until drainage stops.   Driving restrictions      Comments:   No driving until released by the physician.   Lifting restrictions      Comments:   No lifting until released by the physician.   Follow the hip precautions as taught in Physical Therapy      Change dressing      Comments:   You may change your dressing dressing daily with sterile 4 x 4 inch gauze dressing and paper tape.  Do not submerge the incision under water.   TED hose      Comments:   Use stockings (TED hose) for 3 weeks on both leg(s).  You may remove them at night for sleeping.   Do not sit on low chairs, stoools or toilet seats, as it may be difficult to get up from low surfaces        Medication List  As of 06/22/2012  7:39 AM   STOP taking these medications         BIOTIN PO         TAKE these medications         allopurinol 100 MG tablet   Commonly known as: ZYLOPRIM   Take 100 mg by mouth daily after  breakfast.      amLODipine 10 MG tablet   Commonly known as: NORVASC   Take 10 mg by mouth daily after breakfast.      atenolol 50 MG tablet   Commonly known as: TENORMIN   Take 50 mg by mouth daily after breakfast.      rivaroxaban 10 MG Tabs tablet   Commonly known as: XARELTO   Take 1 tablet (10 mg total) by mouth daily with breakfast. Take Xarelto for two and a half more weeks, then discontinue Xarelto.      sertraline 50 MG tablet   Commonly known as: ZOLOFT   Take 50 mg by mouth at bedtime.      SYNTHROID 112 MCG tablet   Generic drug: levothyroxine   Take 112 mcg by mouth at bedtime.      ZENPEP 10000 UNITS Cpep   Generic drug: Pancrelipase (Lip-Prot-Amyl)    Take 1-4 capsules by mouth 3 (three) times daily before meals.           Follow-up Information    Follow up with Gearlean Alf, MD. Schedule an appointment as soon as possible for a visit in 2 weeks.   Contact information:   [facility] [address], [place] [id number]          Signed: Mickel Crow 06/22/2012, 7:39 AM

## 2012-06-23 DIAGNOSIS — M169 Osteoarthritis of hip, unspecified: Secondary | ICD-10-CM | POA: Diagnosis not present

## 2012-06-25 DIAGNOSIS — M169 Osteoarthritis of hip, unspecified: Secondary | ICD-10-CM | POA: Diagnosis not present

## 2012-06-30 DIAGNOSIS — M169 Osteoarthritis of hip, unspecified: Secondary | ICD-10-CM | POA: Diagnosis not present

## 2012-07-02 DIAGNOSIS — M169 Osteoarthritis of hip, unspecified: Secondary | ICD-10-CM | POA: Diagnosis not present

## 2012-07-07 ENCOUNTER — Other Ambulatory Visit: Payer: Medicare Other | Admitting: Internal Medicine

## 2012-07-07 ENCOUNTER — Other Ambulatory Visit: Payer: Self-pay | Admitting: Internal Medicine

## 2012-07-07 DIAGNOSIS — I10 Essential (primary) hypertension: Secondary | ICD-10-CM | POA: Diagnosis not present

## 2012-07-07 DIAGNOSIS — E559 Vitamin D deficiency, unspecified: Secondary | ICD-10-CM

## 2012-07-07 DIAGNOSIS — E041 Nontoxic single thyroid nodule: Secondary | ICD-10-CM | POA: Diagnosis not present

## 2012-07-07 DIAGNOSIS — R7301 Impaired fasting glucose: Secondary | ICD-10-CM | POA: Diagnosis not present

## 2012-07-07 DIAGNOSIS — E785 Hyperlipidemia, unspecified: Secondary | ICD-10-CM | POA: Diagnosis not present

## 2012-07-07 DIAGNOSIS — M169 Osteoarthritis of hip, unspecified: Secondary | ICD-10-CM | POA: Diagnosis not present

## 2012-07-07 LAB — LIPID PANEL
Cholesterol: 216 mg/dL — ABNORMAL HIGH (ref 0–200)
HDL: 87 mg/dL (ref >39)
LDL Cholesterol: 110 mg/dL — ABNORMAL HIGH (ref 0–99)
Total CHOL/HDL Ratio: 2.5 Ratio
Triglycerides: 96 mg/dL (ref <150)
VLDL: 19 mg/dL (ref 0–40)

## 2012-07-07 LAB — COMPREHENSIVE METABOLIC PANEL
ALT: 13 U/L (ref 0–35)
AST: 14 U/L (ref 0–37)
Albumin: 4.1 g/dL (ref 3.5–5.2)
Alkaline Phosphatase: 137 U/L — ABNORMAL HIGH (ref 39–117)
BUN: 22 mg/dL (ref 6–23)
CO2: 25 mEq/L (ref 19–32)
Calcium: 9.9 mg/dL (ref 8.4–10.5)
Chloride: 110 mEq/L (ref 96–112)
Creat: 0.83 mg/dL (ref 0.50–1.10)
Glucose, Bld: 88 mg/dL (ref 70–99)
Potassium: 4.3 mEq/L (ref 3.5–5.3)
Sodium: 144 mEq/L (ref 135–145)
Total Bilirubin: 0.4 mg/dL (ref 0.3–1.2)
Total Protein: 6.1 g/dL (ref 6.0–8.3)

## 2012-07-07 LAB — CBC WITH DIFFERENTIAL/PLATELET
Basophils Absolute: 0 10*3/uL (ref 0.0–0.1)
Basophils Relative: 0 % (ref 0–1)
Eosinophils Absolute: 0.1 10*3/uL (ref 0.0–0.7)
Eosinophils Relative: 1 % (ref 0–5)
HCT: 40.1 % (ref 36.0–46.0)
Hemoglobin: 13.3 g/dL (ref 12.0–15.0)
Lymphocytes Relative: 32 % (ref 12–46)
Lymphs Abs: 2.1 10*3/uL (ref 0.7–4.0)
MCH: 29.2 pg (ref 26.0–34.0)
MCHC: 33.2 g/dL (ref 30.0–36.0)
MCV: 87.9 fL (ref 78.0–100.0)
Monocytes Absolute: 0.5 10*3/uL (ref 0.1–1.0)
Monocytes Relative: 8 % (ref 3–12)
Neutro Abs: 3.7 10*3/uL (ref 1.7–7.7)
Neutrophils Relative %: 59 % (ref 43–77)
Platelets: 215 10*3/uL (ref 150–400)
RBC: 4.56 MIL/uL (ref 3.87–5.11)
RDW: 14.9 % (ref 11.5–15.5)
WBC: 6.4 10*3/uL (ref 4.0–10.5)

## 2012-07-07 LAB — TSH: TSH: 1.308 u[IU]/mL (ref 0.350–4.500)

## 2012-07-08 LAB — VITAMIN D 25 HYDROXY (VIT D DEFICIENCY, FRACTURES): Vit D, 25-Hydroxy: 13 ng/mL — ABNORMAL LOW (ref 30–89)

## 2012-07-09 ENCOUNTER — Ambulatory Visit (INDEPENDENT_AMBULATORY_CARE_PROVIDER_SITE_OTHER): Payer: Medicare Other | Admitting: Internal Medicine

## 2012-07-09 ENCOUNTER — Encounter: Payer: Self-pay | Admitting: Internal Medicine

## 2012-07-09 VITALS — BP 106/62 | HR 66 | Temp 98.7°F | Ht 64.25 in | Wt 136.0 lb

## 2012-07-09 DIAGNOSIS — M1710 Unilateral primary osteoarthritis, unspecified knee: Secondary | ICD-10-CM | POA: Diagnosis not present

## 2012-07-09 DIAGNOSIS — I10 Essential (primary) hypertension: Secondary | ICD-10-CM

## 2012-07-09 DIAGNOSIS — E785 Hyperlipidemia, unspecified: Secondary | ICD-10-CM | POA: Diagnosis not present

## 2012-07-09 DIAGNOSIS — N189 Chronic kidney disease, unspecified: Secondary | ICD-10-CM

## 2012-07-09 DIAGNOSIS — IMO0002 Reserved for concepts with insufficient information to code with codable children: Secondary | ICD-10-CM | POA: Diagnosis not present

## 2012-07-09 DIAGNOSIS — R7989 Other specified abnormal findings of blood chemistry: Secondary | ICD-10-CM

## 2012-07-09 DIAGNOSIS — Z8679 Personal history of other diseases of the circulatory system: Secondary | ICD-10-CM

## 2012-07-09 DIAGNOSIS — Z0000 Encounter for general adult medical examination without abnormal findings: Secondary | ICD-10-CM

## 2012-07-09 DIAGNOSIS — Z860100 Personal history of colon polyps, unspecified: Secondary | ICD-10-CM

## 2012-07-09 DIAGNOSIS — E041 Nontoxic single thyroid nodule: Secondary | ICD-10-CM

## 2012-07-09 DIAGNOSIS — M170 Bilateral primary osteoarthritis of knee: Secondary | ICD-10-CM

## 2012-07-09 DIAGNOSIS — D136 Benign neoplasm of pancreas: Secondary | ICD-10-CM | POA: Diagnosis not present

## 2012-07-09 DIAGNOSIS — E790 Hyperuricemia without signs of inflammatory arthritis and tophaceous disease: Secondary | ICD-10-CM

## 2012-07-09 LAB — POCT URINALYSIS DIPSTICK
Bilirubin, UA: NEGATIVE
Blood, UA: NEGATIVE
Glucose, UA: NEGATIVE
Ketones, UA: NEGATIVE
Leukocytes, UA: NEGATIVE
Nitrite, UA: NEGATIVE
Protein, UA: NEGATIVE
Spec Grav, UA: >=1.03
Urobilinogen, UA: NEGATIVE
pH, UA: 5.5

## 2012-07-09 NOTE — Patient Instructions (Addendum)
Continue same meds and return in 6 months 

## 2012-07-09 NOTE — Progress Notes (Signed)
Subjective:    Patient ID: [name], female    DOB: 10/04/1941, 71 y.o.   MRN: [id number]  HPI 72 year old white female with history of hypertension, hyperlipidemia, osteoarthritis of her hips status post bilateral hip replacement surgery by Dr. Maureen Ralphs right hip January 2013 and left hip in June 2013. History of chronic kidney disease, right thyroid nodule, remote history of mitral valve prolapse, adenomatous colon polyp. In February 2012 she had a pancreaticoduodenectomy and pancreatic stent placed with Whipple procedure for benign tumor. She had a serous microcystic adenoma of the pancreas. She is now on Pancrease. She lost her tremendous amount of weight. In March 31, 2007 she weighed 185 pounds but by January 2012 she had lost down to 132 pounds.  She is using a walker to ambulate. Also has osteoarthritis of her knees. Check colonoscopy in 03/30/2005. Pneumovax vaccine in 30-Mar-2005. Tetanus immunization 2002/03/30. Needs to have mammogram in the near future. Last mammogram on file may 2011.  Additional history: Had lumbar fusion L4-L5 in June 2011. Had anterior cervical decompression of C5-C6 and C6-C7 with fusion in Mar 31, 2007.  History of adenomatous colon polyps. Had Bartholin's cyst removed over 30 years ago.  In 30-Mar-2008 we noticed mild elevation in her creatinine of 1.4. History of elevated uric acid. Started on allopurinol 100 mg daily in 03/30/08. History of pyelonephritis in Mar 30, 2008 treated as an outpatient.  Family history: Mother died in [id number] from complications of dementia with history of stroke. Brother died from  kidney cancer. Father died in 03-31-1999 of congestive heart failure.  One brother in good health. One sister with fibromyalgia. One sister in good health.  Does not smoke. Very occasionally will drink some wine. 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 She is a widow. No children.   Subjective:   Patient presents for Medicare Annual/Subsequent preventive examination.   Review  Past Medical/Family/Social:   See above   Risk Factors  Current exercise habits: Getting PT s/p left hip replacement in June. Had right hip replacement Jan. Dietary issues discussed: appetite fair  Cardiac risk factors: Hyperlipidemia, family history, history of hypertension  Depression Screen  (Note: if answer to either of the following is "Yes", a more complete depression screening is indicated)   Over the past two weeks, have you felt down, depressed or hopeless? No  Over the past two weeks, have you felt little interest or pleasure in doing things? No Have you lost interest or pleasure in daily life? No Do you often feel hopeless? No Do you cry easily over simple problems? No   Activities of Daily Living  In your present state of health, do you have any difficulty performing the following activities?:   Driving? No  Managing money? No  Feeding yourself? No  Getting from bed to chair? No  Climbing a flight of stairs? No  Preparing food and eating?: No  Bathing or showering? No  Getting dressed: No  Getting to the toilet? No  Using the toilet:No  Moving around from place to place: No  In the past year have you fallen or had a near fall?:No  Are you sexually active? No  Do you have more than one partner? No   Hearing Difficulties: No  Do you often ask people to speak up or repeat themselves? No  Do you experience ringing or noises in your ears? No  Do you have difficulty understanding soft or whispered voices? No  Do you feel that you have a problem with  memory? No Do you often misplace items? No    Home Safety:  Do you have a smoke alarm at your residence? Yes Do you have grab bars in the bathroom? yes Do you have throw rugs in your house? NO   Cognitive Testing  Alert? Yes Normal Appearance?Yes  Oriented to person? Yes Place? Yes  Time? Yes  Recall of three objects? Yes  Can perform simple calculations? Yes  Displays appropriate judgment?Yes  Can read the  correct time from a watch face?Yes   List the Names of Other Physician/Practitioners you currently use:  See referral list for the physicians patient is currently seeing. Dr. Maureen Ralphs-- Orthopedist, Dr. Barry Dienes (surgeon) for pancreatic surgery follow up.    Review of Systems: See Hx and PE   Objective:     General appearance: Appears stated age and mildly obese  Head: Normocephalic, without obvious abnormality, atraumatic  Eyes: conj clear, EOMi PEERLA  Ears: normal TM's and external ear canals both ears  Nose: Nares normal. Septum midline. Mucosa normal. No drainage or sinus tenderness.  Throat: lips, mucosa, and tongue normal; teeth and gums normal  Neck: no adenopathy, no carotid bruit, no JVD, supple, symmetrical, trachea midline and thyroid not enlarged, symmetric, no tenderness/mass/nodules  No CVA tenderness.  Lungs: clear to auscultation bilaterally  Breasts: normal appearance, no masses or tenderness, top of the pacemaker on left upper chest.  Heart: regular rate and rhythm, S1, S2 normal, no murmur, click, rub or gallop  Abdomen: soft, non-tender; bowel sounds normal; no masses, no organomegaly  Musculoskeletal: ROM normal in all joints, no crepitus, no deformity, Normal muscle strengthen. Back  is symmetric, no curvature. Skin: Skin color, texture, turgor normal. No rashes or lesions  Lymph nodes: Cervical, supraclavicular, and axillary nodes normal.  Neurologic: CN 2 -12 Normal, Normal symmetric reflexes. Normal coordination and gait  Psych: Alert & Oriented x 3, Mood appear stable.    Assessment:    Annual wellness medicare exam   Plan:    During the course of the visit the patient was educated and counseled about appropriate screening and preventive services including: Annual mammogram, annual influenza immunization, bone density every 2 years       Patient Instructions (the written plan) was given to the patient.  Medicare Attestation  I have personally  reviewed:  The patient's medical and social history  Their use of alcohol, tobacco or illicit drugs  Their current medications and supplements  The patient's functional ability including ADLs,fall risks, home safety risks, cognitive, and hearing and visual impairment  Diet and physical activities  Evidence for depression or mood disorders  The patient's weight, height, BMI, and visual acuity have been recorded in the chart. I have made referrals, counseling, and provided education to the patient based on review of the above and I have provided the patient with a written personalized care plan for preventive services.        Review of Systems  Constitutional: Positive for appetite change.       Appetite fair  HENT: Positive for rhinorrhea.   Eyes: Positive for visual disturbance.       Cataracts per Dr. Idolina Primer  Respiratory: Negative.   Cardiovascular: Negative.   Gastrointestinal: Negative.   Genitourinary: Negative.   Musculoskeletal:       Recovering from hip replacement surgery  Neurological: Negative.   Hematological: Negative.   Psychiatric/Behavioral: Negative.        Objective:   Physical Exam  Vitals reviewed. Constitutional: She is  oriented to person, place, and time. She appears well-developed and well-nourished. No distress.  HENT:  Head: Normocephalic and atraumatic.  Right Ear: External ear normal.  Left Ear: External ear normal.  Mouth/Throat: Oropharynx is clear and moist.  Eyes: Conjunctivae normal and EOM are normal. Pupils are equal, round, and reactive to light. Right eye exhibits no discharge. Left eye exhibits no discharge. No scleral icterus.  Neck: Normal range of motion. Neck supple. No JVD present. No thyromegaly present.  Cardiovascular: Normal rate, regular rhythm, normal heart sounds and intact distal pulses.   No murmur heard. Pulmonary/Chest: Effort normal and breath sounds normal. No respiratory distress. She has no wheezes. She has no rales.  She exhibits no tenderness.  Abdominal: Soft. Bowel sounds are normal. She exhibits no distension and no mass. There is no tenderness. There is no rebound and no guarding.  Musculoskeletal: She exhibits no edema.  Lymphadenopathy:    She has no cervical adenopathy.  Neurological: She is alert and oriented to person, place, and time. She has normal reflexes. She displays normal reflexes. No cranial nerve deficit. Coordination normal.  Skin: Skin is warm and dry. She is not diaphoretic.  Psychiatric: She has a normal mood and affect. Her behavior is normal. Thought content normal.          Assessment & Plan:

## 2012-07-10 DIAGNOSIS — M169 Osteoarthritis of hip, unspecified: Secondary | ICD-10-CM | POA: Diagnosis not present

## 2012-07-10 LAB — HEMOGLOBIN A1C
Hgb A1c MFr Bld: 5 % (ref <5.7)
Mean Plasma Glucose: 97 mg/dL (ref <117)

## 2012-07-14 DIAGNOSIS — M169 Osteoarthritis of hip, unspecified: Secondary | ICD-10-CM | POA: Diagnosis not present

## 2012-07-16 DIAGNOSIS — M169 Osteoarthritis of hip, unspecified: Secondary | ICD-10-CM | POA: Diagnosis not present

## 2012-07-21 DIAGNOSIS — M169 Osteoarthritis of hip, unspecified: Secondary | ICD-10-CM | POA: Diagnosis not present

## 2012-07-23 DIAGNOSIS — M169 Osteoarthritis of hip, unspecified: Secondary | ICD-10-CM | POA: Diagnosis not present

## 2012-07-28 DIAGNOSIS — M169 Osteoarthritis of hip, unspecified: Secondary | ICD-10-CM | POA: Diagnosis not present

## 2012-07-30 DIAGNOSIS — M169 Osteoarthritis of hip, unspecified: Secondary | ICD-10-CM | POA: Diagnosis not present

## 2012-08-04 DIAGNOSIS — M169 Osteoarthritis of hip, unspecified: Secondary | ICD-10-CM | POA: Diagnosis not present

## 2012-10-07 ENCOUNTER — Ambulatory Visit (INDEPENDENT_AMBULATORY_CARE_PROVIDER_SITE_OTHER): Payer: Medicare Other | Admitting: Internal Medicine

## 2012-10-07 DIAGNOSIS — Z23 Encounter for immunization: Secondary | ICD-10-CM | POA: Diagnosis not present

## 2012-11-09 ENCOUNTER — Other Ambulatory Visit: Payer: Self-pay | Admitting: Internal Medicine

## 2012-11-10 DIAGNOSIS — M169 Osteoarthritis of hip, unspecified: Secondary | ICD-10-CM | POA: Diagnosis not present

## 2012-11-13 DIAGNOSIS — E790 Hyperuricemia without signs of inflammatory arthritis and tophaceous disease: Secondary | ICD-10-CM | POA: Insufficient documentation

## 2012-11-13 DIAGNOSIS — E041 Nontoxic single thyroid nodule: Secondary | ICD-10-CM | POA: Insufficient documentation

## 2012-11-13 DIAGNOSIS — N189 Chronic kidney disease, unspecified: Secondary | ICD-10-CM | POA: Insufficient documentation

## 2012-11-19 IMAGING — CR DG HIP W/ PELVIS BILAT
5 series · 5 of 5 positions shown · non-contrast
Comparison: None.

CLINICAL DATA: Bilateral hip pain.  Decreased range of motion.

BILATERAL HIP WITH PELVIS - 4+ VIEW

[view not recorded (1 of 5)]
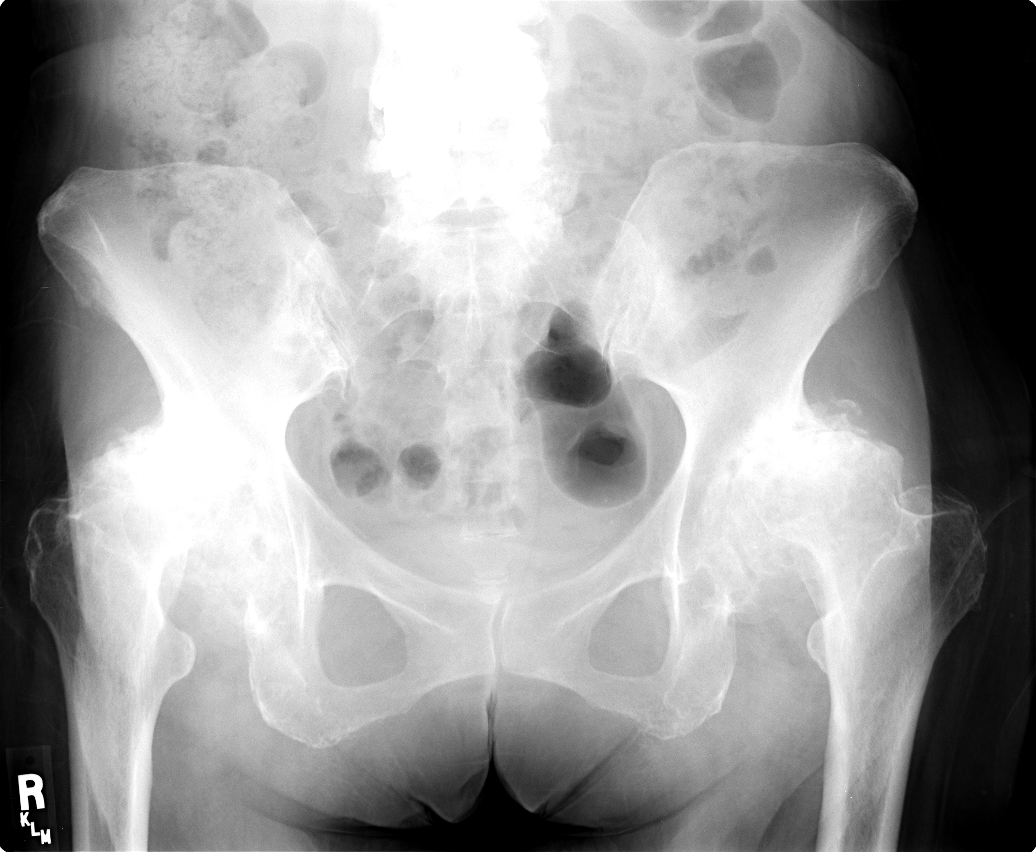

[view not recorded (2 of 5)]
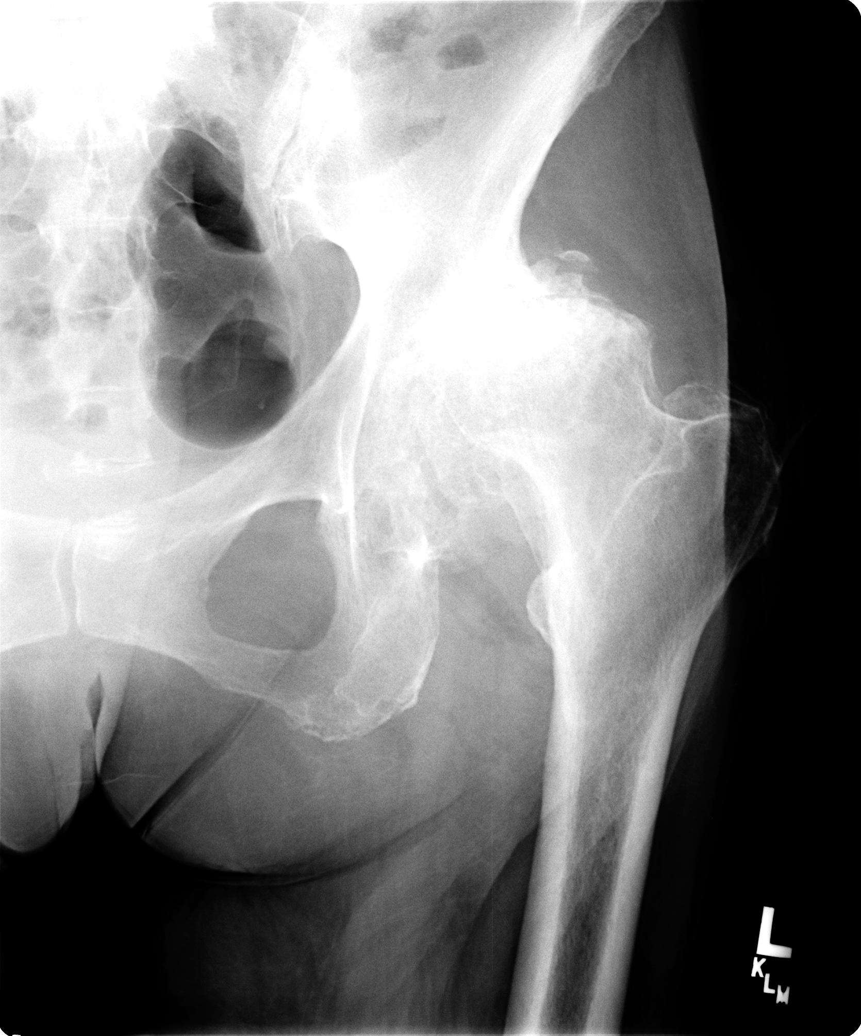

[view not recorded (3 of 5)]
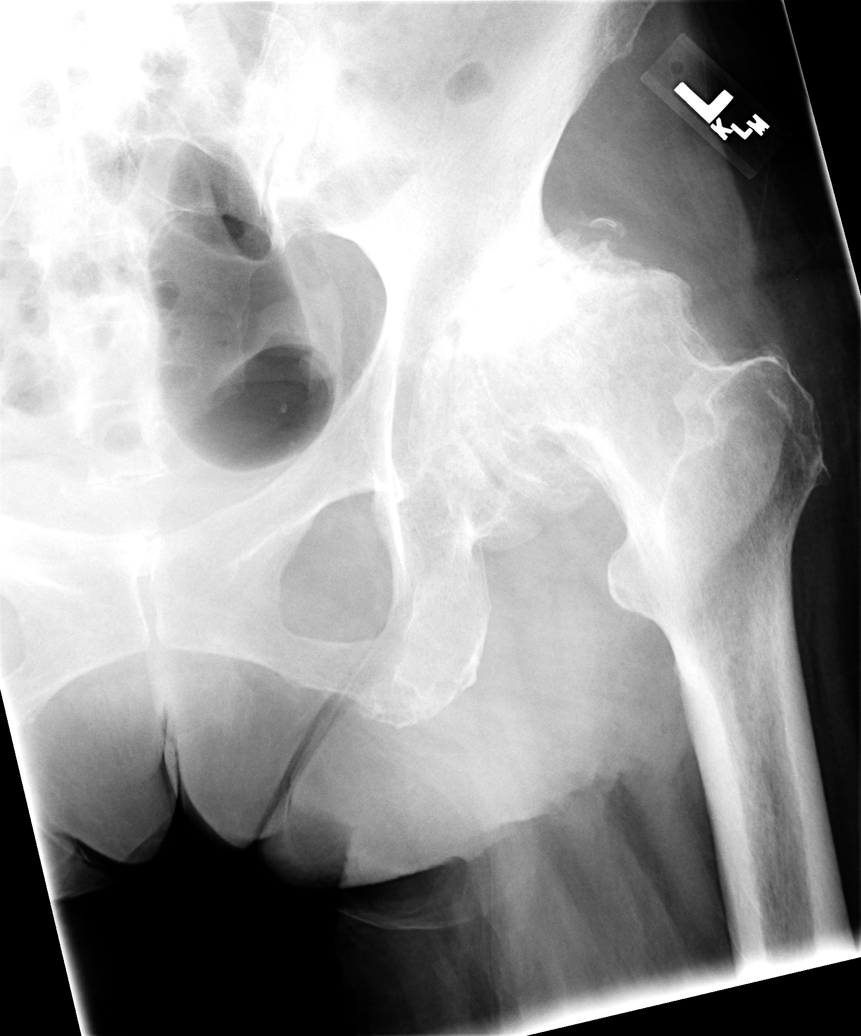

[view not recorded (4 of 5)]
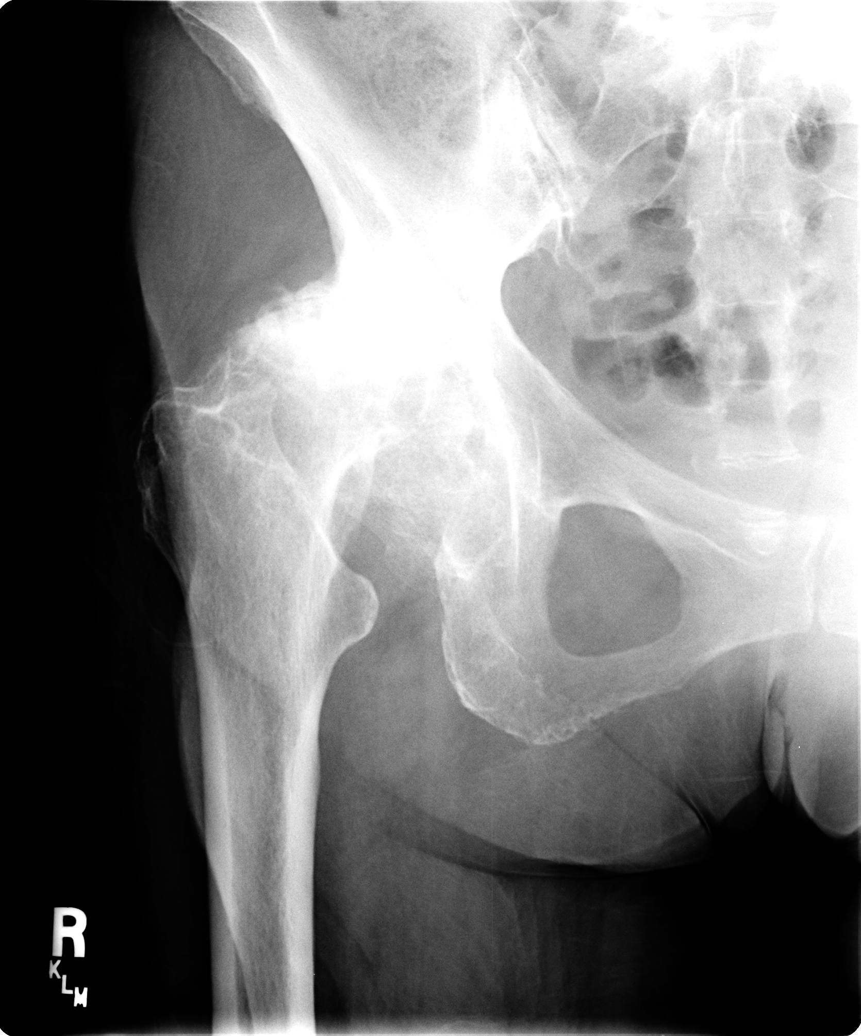

[view not recorded (5 of 5)]
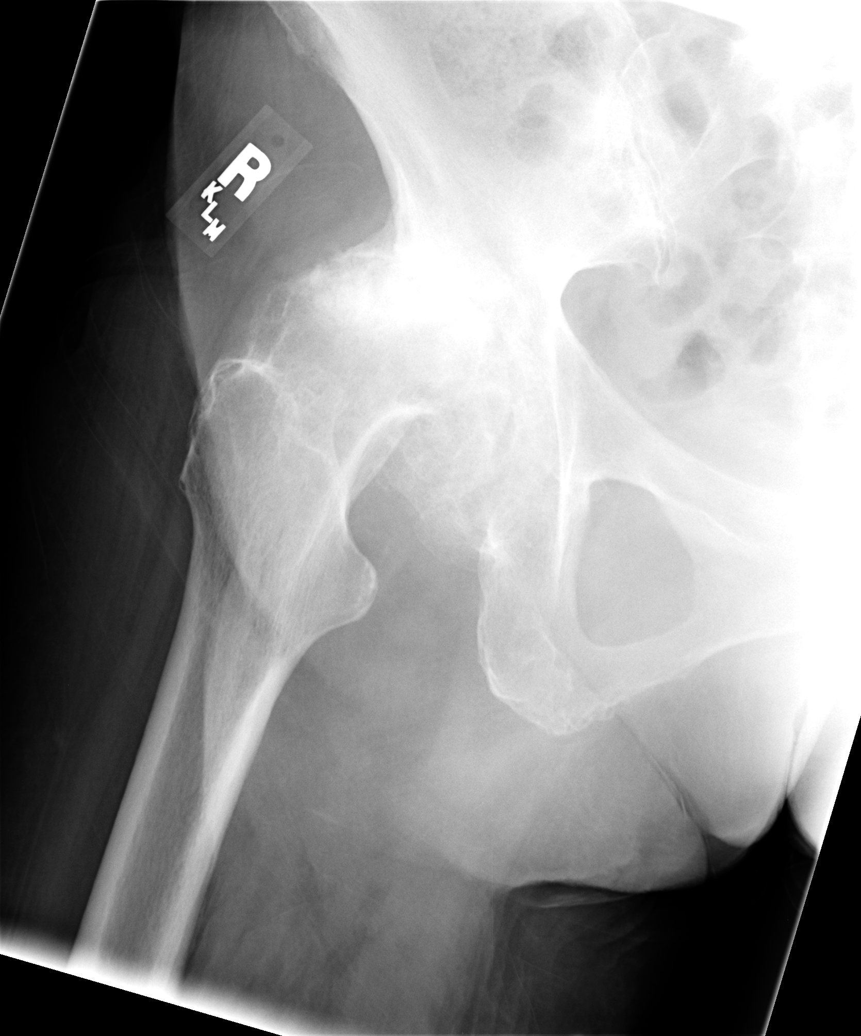

[5 of 5 positions shown; findings below may reference images not displayed]

FINDINGS: Severe osteoarthritis of both hips is seen, with chronic
avascular necrosis of both femoral heads.  Significant bony
remodeling of the femoral heads and acetabula are seen bilaterally
with superior and lateral subluxation of the femoral heads.

There is no evidence of pelvic fracture or other pelvic bone
lesions.
IMPRESSION: End-stage bilateral hip osteoarthritis, with chronic avascular
necrosis and partial subluxation of both femoral heads.  No acute
findings.

## 2012-12-22 ENCOUNTER — Other Ambulatory Visit: Payer: Self-pay | Admitting: Internal Medicine

## 2013-01-08 ENCOUNTER — Ambulatory Visit (INDEPENDENT_AMBULATORY_CARE_PROVIDER_SITE_OTHER): Payer: Medicare Other | Admitting: General Surgery

## 2013-01-08 ENCOUNTER — Encounter (INDEPENDENT_AMBULATORY_CARE_PROVIDER_SITE_OTHER): Payer: Self-pay | Admitting: General Surgery

## 2013-01-08 VITALS — BP 134/84 | HR 60 | Temp 98.2°F | Resp 14 | Ht 63.5 in | Wt 158.8 lb

## 2013-01-08 DIAGNOSIS — K8689 Other specified diseases of pancreas: Secondary | ICD-10-CM

## 2013-01-08 DIAGNOSIS — K8681 Exocrine pancreatic insufficiency: Secondary | ICD-10-CM

## 2013-01-08 DIAGNOSIS — D136 Benign neoplasm of pancreas: Secondary | ICD-10-CM

## 2013-01-08 NOTE — Assessment & Plan Note (Signed)
Benign lesion with minimal chance of recurrence.    Pt 2 years out from surgery.   Follow up as needed.

## 2013-01-08 NOTE — Progress Notes (Signed)
HISTORY: Pt is doing well.  She is now 2 years out from [place] for serous microcystic cystadenoma of the pancreas.  She has regained the weight she lost before she developed gastric outlet obstruction from the pancreatic mass.  She has also had bilateral hip replacements since the surgery.  She is not having problems with reflux.  She is not needing any appetite stimulant.  She does continue to require creon with meals and snacks.  She varies between 2 and 3 caps with meals.      PERTINENT REVIEW OF SYSTEMS: Otherwise negative.     EXAM: Head: Normocephalic and atraumatic.  Eyes:  Conjunctivae are normal. Pupils are equal, round, and reactive to light. No scleral icterus.  Neck:  Normal range of motion. Neck supple. No tracheal deviation present. No thyromegaly present.  Resp: No respiratory distress, normal effort. Abd:  Abdomen is soft, non distended and non tender. No masses are palpable.  There is no rebound and no guarding.  Neurological: Alert and oriented to person, place, and time. Sl antalgic gait.  Skin: Skin is warm and dry. No rash noted. No diaphoretic. No erythema. No pallor.  Psychiatric: Normal mood and affect. Normal behavior. Judgment and thought content normal.      ASSESSMENT AND PLAN:   Exocrine pancreatic insufficiency Continue on Creon indefinitely.    Pancreatic adenoma Benign lesion with minimal chance of recurrence.    Pt 2 years out from surgery.   Follow up as needed.        Milus Height, MD Surgical Oncology, [facility]  Elby Showers, MD Elby Showers, MD

## 2013-01-08 NOTE — Patient Instructions (Addendum)
Follow up as needed.  Let me know if you need [place] to refill pancreatic enzymes.

## 2013-01-08 NOTE — Assessment & Plan Note (Signed)
Continue on Creon indefinitely.

## 2013-01-11 ENCOUNTER — Encounter: Payer: Self-pay | Admitting: Internal Medicine

## 2013-01-11 ENCOUNTER — Ambulatory Visit (INDEPENDENT_AMBULATORY_CARE_PROVIDER_SITE_OTHER): Payer: Medicare Other | Admitting: Internal Medicine

## 2013-01-11 VITALS — BP 126/70 | HR 72 | Temp 98.7°F | Wt 157.0 lb

## 2013-01-11 DIAGNOSIS — Z8659 Personal history of other mental and behavioral disorders: Secondary | ICD-10-CM

## 2013-01-11 DIAGNOSIS — I10 Essential (primary) hypertension: Secondary | ICD-10-CM

## 2013-01-11 DIAGNOSIS — N189 Chronic kidney disease, unspecified: Secondary | ICD-10-CM | POA: Diagnosis not present

## 2013-01-11 DIAGNOSIS — E785 Hyperlipidemia, unspecified: Secondary | ICD-10-CM | POA: Diagnosis not present

## 2013-01-11 DIAGNOSIS — Z96649 Presence of unspecified artificial hip joint: Secondary | ICD-10-CM

## 2013-01-11 DIAGNOSIS — M1990 Unspecified osteoarthritis, unspecified site: Secondary | ICD-10-CM

## 2013-01-11 DIAGNOSIS — Z96643 Presence of artificial hip joint, bilateral: Secondary | ICD-10-CM

## 2013-01-11 DIAGNOSIS — E039 Hypothyroidism, unspecified: Secondary | ICD-10-CM | POA: Diagnosis not present

## 2013-01-11 DIAGNOSIS — Z23 Encounter for immunization: Secondary | ICD-10-CM

## 2013-01-11 DIAGNOSIS — Z8639 Personal history of other endocrine, nutritional and metabolic disease: Secondary | ICD-10-CM | POA: Diagnosis not present

## 2013-01-11 DIAGNOSIS — Z862 Personal history of diseases of the blood and blood-forming organs and certain disorders involving the immune mechanism: Secondary | ICD-10-CM

## 2013-01-11 DIAGNOSIS — Z8739 Personal history of other diseases of the musculoskeletal system and connective tissue: Secondary | ICD-10-CM

## 2013-01-11 DIAGNOSIS — K8689 Other specified diseases of pancreas: Secondary | ICD-10-CM

## 2013-01-11 LAB — BASIC METABOLIC PANEL
BUN: 24 mg/dL — ABNORMAL HIGH (ref 6–23)
CO2: 24 mEq/L (ref 19–32)
Calcium: 9.9 mg/dL (ref 8.4–10.5)
Chloride: 109 mEq/L (ref 96–112)
Creat: 1.1 mg/dL (ref 0.50–1.10)
Glucose, Bld: 100 mg/dL — ABNORMAL HIGH (ref 70–99)
Potassium: 4.3 mEq/L (ref 3.5–5.3)
Sodium: 144 mEq/L (ref 135–145)

## 2013-01-11 LAB — LIPID PANEL
Cholesterol: 223 mg/dL — ABNORMAL HIGH (ref 0–200)
HDL: 81 mg/dL (ref >39)
LDL Cholesterol: 121 mg/dL — ABNORMAL HIGH (ref 0–99)
Total CHOL/HDL Ratio: 2.8 Ratio
Triglycerides: 106 mg/dL (ref <150)
VLDL: 21 mg/dL (ref 0–40)

## 2013-01-11 LAB — URIC ACID: Uric Acid, Serum: 5.7 mg/dL (ref 2.4–7.0)

## 2013-01-11 LAB — TSH: TSH: 1.27 u[IU]/mL (ref 0.350–4.500)

## 2013-01-11 NOTE — Patient Instructions (Addendum)
Continue same medications and return in 6 months 

## 2013-01-11 NOTE — Progress Notes (Signed)
  Subjective:    Patient ID: [name], female    DOB: [birth year], 72 y.o.   MRN: [id number]  HPI Has had 2 hip replacements. First one was on left in January 2013 and then right hip replaced summer 2013. Doing better and walking for 20 - 30 minutes 3 times weekly. Appetite has improved. Lost a lot of weight with benign pancreatic tumor. Remains on Pancreatic enzymes. BP is excellent om current regimen. Not on cholesterol medication at this time.History of gout and chronic kidney disease which have been stable. No more gouty attacks. Last gouty attack was when we placed her on Allopurinol.   She saw Dr. Barry Dienes last week who released her from her care. Says she will need pancreatic enzymes for the remainder of her life. Has to  be careful eating fried or fatty foods because of malabsorption status post resection of large benign pancreatic adenoma. Says appetite is quite good. She will need to watch her weight. She is to be overweight before developing pancreatic adenoma. Needs to make sure she exercises. Has not had Zostavax vaccine so prescription was written for her to get this at pharmacy. Tdap vaccine given today.  History of adenomatous colon polyps with colonoscopy done 2006. History of lumbar fusion L4-L5 June 2011. Underwent pancreaticoduodenectomy with cholecystectomy February 2012 for serous microcystic adenoma of the pancreas. Used to weigh 185 pounds in 2008 but by January 2012 had lost down to 132 pounds.    Review of Systems     Objective:   Physical Exam  Constitutional: She appears well-nourished. No distress.  Neck: Neck supple. No JVD present. No thyromegaly present.  Cardiovascular: Normal rate, regular rhythm, normal heart sounds and intact distal pulses.   No murmur heard. Pulmonary/Chest: Effort normal and breath sounds normal. No respiratory distress. She has no wheezes. She has no rales.  Musculoskeletal: She exhibits no edema.  Lymphadenopathy:    She has no cervical  adenopathy.  Skin: Skin is warm and dry. She is not diaphoretic.  Psychiatric: She has a normal mood and affect. Her behavior is normal. Judgment and thought content normal.          Assessment & Plan:  Hypertension-stable on Tenormin and amlodipine  History of hyperlipidemia but has not been on lipid-lowering medication such tremendous weight loss with pancreatic adenoma. Diet controlled hyperlipidemia at present time  History of thyroid nodule on thyroid replacement therapy  History of mitral valve prolapse  History of depression-treated with Zoloft  History of Whipple procedure for benign pancreatic tumor February 2012 now on Pancrease  History of cervical discectomy  History of lumbar discectomy  History of osteoarthritis  History of chronic kidney disease-currently being monitored but stable  History of gout  Plan: B- met and lipid panel drawn today along with TSH. Return in 6 months for physical exam. Okay to refill pancreatic enzyme should drugstore calls as Dr. Barry Dienes has been doing this previously and she is now released from her care. Patient had influenza immunization. Tdap given . To get Zostavax at pharmacy.

## 2013-01-11 NOTE — Addendum Note (Signed)
Addended by: Brett Canales on: 01/11/2013 03:02 PM   Modules accepted: Orders

## 2013-01-12 NOTE — Progress Notes (Signed)
Patient informed. 

## 2013-01-31 ENCOUNTER — Other Ambulatory Visit: Payer: Self-pay | Admitting: Internal Medicine

## 2013-01-31 NOTE — Telephone Encounter (Signed)
Duplicate orders please contact pharmacy

## 2013-04-05 DIAGNOSIS — Z1231 Encounter for screening mammogram for malignant neoplasm of breast: Secondary | ICD-10-CM | POA: Diagnosis not present

## 2013-04-05 DIAGNOSIS — Z803 Family history of malignant neoplasm of breast: Secondary | ICD-10-CM | POA: Diagnosis not present

## 2013-05-04 ENCOUNTER — Other Ambulatory Visit (INDEPENDENT_AMBULATORY_CARE_PROVIDER_SITE_OTHER): Payer: Self-pay | Admitting: General Surgery

## 2013-05-05 ENCOUNTER — Other Ambulatory Visit: Payer: Self-pay | Admitting: Internal Medicine

## 2013-05-06 ENCOUNTER — Telehealth: Payer: Self-pay | Admitting: Internal Medicine

## 2013-05-06 MED ORDER — PANCRELIPASE (LIP-PROT-AMYL) 10000 UNITS PO CPEP: 1.0000 | ORAL_CAPSULE | Freq: Three times a day (TID) | ORAL | Status: DC

## 2013-05-06 NOTE — Telephone Encounter (Signed)
Refill for one year 

## 2013-05-16 ENCOUNTER — Other Ambulatory Visit: Payer: Self-pay | Admitting: Internal Medicine

## 2013-07-12 ENCOUNTER — Other Ambulatory Visit: Payer: Medicare Other | Admitting: Internal Medicine

## 2013-07-12 DIAGNOSIS — Z780 Asymptomatic menopausal state: Secondary | ICD-10-CM | POA: Diagnosis not present

## 2013-07-12 DIAGNOSIS — I10 Essential (primary) hypertension: Secondary | ICD-10-CM

## 2013-07-12 DIAGNOSIS — E041 Nontoxic single thyroid nodule: Secondary | ICD-10-CM | POA: Diagnosis not present

## 2013-07-12 DIAGNOSIS — E785 Hyperlipidemia, unspecified: Secondary | ICD-10-CM | POA: Diagnosis not present

## 2013-07-12 LAB — LIPID PANEL
Cholesterol: 203 mg/dL — ABNORMAL HIGH (ref 0–200)
HDL: 61 mg/dL (ref >39)
LDL Cholesterol: 115 mg/dL — ABNORMAL HIGH (ref 0–99)
Total CHOL/HDL Ratio: 3.3 Ratio
Triglycerides: 136 mg/dL (ref <150)
VLDL: 27 mg/dL (ref 0–40)

## 2013-07-12 LAB — COMPREHENSIVE METABOLIC PANEL
ALT: 16 U/L (ref 0–35)
AST: 17 U/L (ref 0–37)
Albumin: 4.2 g/dL (ref 3.5–5.2)
Alkaline Phosphatase: 77 U/L (ref 39–117)
BUN: 19 mg/dL (ref 6–23)
CO2: 22 mEq/L (ref 19–32)
Calcium: 9.9 mg/dL (ref 8.4–10.5)
Chloride: 112 mEq/L (ref 96–112)
Creat: 0.96 mg/dL (ref 0.50–1.10)
Glucose, Bld: 95 mg/dL (ref 70–99)
Potassium: 4.2 mEq/L (ref 3.5–5.3)
Sodium: 142 mEq/L (ref 135–145)
Total Bilirubin: 0.3 mg/dL (ref 0.3–1.2)
Total Protein: 6.1 g/dL (ref 6.0–8.3)

## 2013-07-12 LAB — CBC WITH DIFFERENTIAL/PLATELET
Basophils Absolute: 0 10*3/uL (ref 0.0–0.1)
Basophils Relative: 0 % (ref 0–1)
Eosinophils Absolute: 0.1 10*3/uL (ref 0.0–0.7)
Eosinophils Relative: 1 % (ref 0–5)
HCT: 40 % (ref 36.0–46.0)
Hemoglobin: 13.4 g/dL (ref 12.0–15.0)
Lymphocytes Relative: 23 % (ref 12–46)
Lymphs Abs: 1.9 10*3/uL (ref 0.7–4.0)
MCH: 29.3 pg (ref 26.0–34.0)
MCHC: 33.5 g/dL (ref 30.0–36.0)
MCV: 87.5 fL (ref 78.0–100.0)
Monocytes Absolute: 0.6 10*3/uL (ref 0.1–1.0)
Monocytes Relative: 7 % (ref 3–12)
Neutro Abs: 5.8 10*3/uL (ref 1.7–7.7)
Neutrophils Relative %: 69 % (ref 43–77)
Platelets: 217 10*3/uL (ref 150–400)
RBC: 4.57 MIL/uL (ref 3.87–5.11)
RDW: 13.6 % (ref 11.5–15.5)
WBC: 8.5 10*3/uL (ref 4.0–10.5)

## 2013-07-12 LAB — TSH: TSH: 1.032 u[IU]/mL (ref 0.350–4.500)

## 2013-07-13 ENCOUNTER — Ambulatory Visit (INDEPENDENT_AMBULATORY_CARE_PROVIDER_SITE_OTHER): Payer: Medicare Other | Admitting: Internal Medicine

## 2013-07-13 ENCOUNTER — Encounter: Payer: Self-pay | Admitting: Internal Medicine

## 2013-07-13 VITALS — BP 126/66 | HR 68 | Temp 98.7°F | Ht 64.5 in | Wt 169.0 lb

## 2013-07-13 DIAGNOSIS — I10 Essential (primary) hypertension: Secondary | ICD-10-CM | POA: Diagnosis not present

## 2013-07-13 DIAGNOSIS — Z86010 Personal history of colonic polyps: Secondary | ICD-10-CM | POA: Diagnosis not present

## 2013-07-13 DIAGNOSIS — E790 Hyperuricemia without signs of inflammatory arthritis and tophaceous disease: Secondary | ICD-10-CM

## 2013-07-13 DIAGNOSIS — M1710 Unilateral primary osteoarthritis, unspecified knee: Secondary | ICD-10-CM

## 2013-07-13 DIAGNOSIS — Z0000 Encounter for general adult medical examination without abnormal findings: Secondary | ICD-10-CM

## 2013-07-13 DIAGNOSIS — E785 Hyperlipidemia, unspecified: Secondary | ICD-10-CM | POA: Diagnosis not present

## 2013-07-13 DIAGNOSIS — D136 Benign neoplasm of pancreas: Secondary | ICD-10-CM | POA: Diagnosis not present

## 2013-07-13 DIAGNOSIS — N189 Chronic kidney disease, unspecified: Secondary | ICD-10-CM

## 2013-07-13 DIAGNOSIS — Z8679 Personal history of other diseases of the circulatory system: Secondary | ICD-10-CM

## 2013-07-13 DIAGNOSIS — Z862 Personal history of diseases of the blood and blood-forming organs and certain disorders involving the immune mechanism: Secondary | ICD-10-CM

## 2013-07-13 DIAGNOSIS — R7989 Other specified abnormal findings of blood chemistry: Secondary | ICD-10-CM

## 2013-07-13 DIAGNOSIS — M170 Bilateral primary osteoarthritis of knee: Secondary | ICD-10-CM

## 2013-07-13 DIAGNOSIS — Z8639 Personal history of other endocrine, nutritional and metabolic disease: Secondary | ICD-10-CM

## 2013-07-13 DIAGNOSIS — IMO0002 Reserved for concepts with insufficient information to code with codable children: Secondary | ICD-10-CM

## 2013-07-13 DIAGNOSIS — Z96643 Presence of artificial hip joint, bilateral: Secondary | ICD-10-CM

## 2013-07-13 DIAGNOSIS — Z96649 Presence of unspecified artificial hip joint: Secondary | ICD-10-CM

## 2013-07-13 LAB — POCT URINALYSIS DIPSTICK
Bilirubin, UA: NEGATIVE
Blood, UA: NEGATIVE
Glucose, UA: NEGATIVE
Ketones, UA: NEGATIVE
Leukocytes, UA: NEGATIVE
Nitrite, UA: NEGATIVE
Protein, UA: NEGATIVE
Spec Grav, UA: >=1.03
Urobilinogen, UA: NEGATIVE
pH, UA: 6

## 2013-07-13 LAB — VITAMIN D 25 HYDROXY (VIT D DEFICIENCY, FRACTURES): Vit D, 25-Hydroxy: 62 ng/mL (ref 30–89)

## 2013-07-13 NOTE — Progress Notes (Signed)
Subjective:    Patient ID: [name], female    DOB: 1941/08/03, 72 y.o.   MRN: [id number]  HPI Has gained 12 pounds in past 6 months. Needs to watch diet. Walks 3 days a week for half hour. Advise try to increase walking. No issues with eyes.   She is now 72 years old with history of hypertension, hyperlipidemia, osteoarthritis of her hips status post bilateral hip replacement surgery by Dr. Achille Rich [place]. Right hip was done January 2013 and left hip was done in June 2013. History of chronic kidney disease, right thyroid nodule, remote history of mitral valve prolapse, history of adenomatous colon polyp.  In February 2012 she had a pancreaticoduodenectomy and pancreatic stent placed with Whipple procedure for benign tumor. She has a serous microcystic adenoma of the pancreas. She is now on Pancrease. If she doesn't take Pancrease, she has diarrhea. She lost a tremendous amount of weight before during and after the surgery. In 04-04-2007 she weighed 185 pounds but by January 2012 she lost down to 132 pounds.  History of osteoarthritis of her knees. Had colonoscopy 2005/04/03. Pneumovax vaccine 2005/04/03. Tetanus immunization 04/03/02.  History of lumbar fusion L4-L5 in June 2011. Had anterior cervical decompression of C5-C6 and C6-C7 with fusion in 2007/04/04.  Had Bartholin's cyst removed over 30 years ago  In Apr 03, 2008 she was found to have mild elevation of her creatinine of 1.4. She has a history of elevated uric acid. We started her on allopurinol in 2008/04/03. History of pyelonephritis in Apr 03, 2008 treated as an outpatient.  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 No children. Does not smoke. Very occasionally will drink some wine.  Family history: Mother died in [id number] from complications of dementia with history of stroke. Brother died from kidney cancer. Father died in 04/04/1999 and congestive heart failure. One brother in good health. One sister with fibromyalgia. One sister in  good health.    Review of Systems  Constitutional: Positive for appetite change.       Weight gain over past 6 months of 12 pounds  HENT: Negative.   Respiratory: Negative.   Cardiovascular: Negative.   Gastrointestinal:       Has diarrhea unless take pancrease  Endocrine: Negative.   Genitourinary: Negative.   Allergic/Immunologic: Negative.   Neurological: Negative.   Hematological: Negative.   Psychiatric/Behavioral: Negative.        Objective:   Physical Exam  Vitals reviewed. Constitutional: She is oriented to person, place, and time. She appears well-developed and well-nourished. No distress.  HENT:  Head: Normocephalic and atraumatic.  Right Ear: External ear normal.  Left Ear: External ear normal.  Mouth/Throat: Oropharynx is clear and moist. No oropharyngeal exudate.  Eyes: Conjunctivae and EOM are normal. Pupils are equal, round, and reactive to light. Right eye exhibits no discharge. Left eye exhibits no discharge. No scleral icterus.  Neck: Neck supple. No JVD present. No thyromegaly present.  No thyroid nodule appreciated  Cardiovascular: Normal rate, regular rhythm, normal heart sounds and intact distal pulses.   No murmur heard. No click appreciated  Pulmonary/Chest: Effort normal. No respiratory distress. She has no wheezes. She has no rales.  Abdominal: Soft. Bowel sounds are normal. She exhibits no distension and no mass. There is no tenderness. There is no rebound and no guarding.  Genitourinary:  Bimanual normal  Musculoskeletal: Normal range of motion. She exhibits no edema.  Osteoarthritis of knees  Lymphadenopathy:    She  has no cervical adenopathy.  Neurological: She is alert and oriented to person, place, and time. She has normal reflexes. No cranial nerve deficit. Coordination normal.  Skin: Skin is warm and dry. No rash noted. She is not diaphoretic.  Psychiatric: She has a normal mood and affect. Her behavior is normal. Judgment and thought  content normal.          Assessment & Plan:  Weight gain-patient needs to diet and exercise.  Hypertension-stable  Hyperlipidemia  Osteoarthritis of knees  Status post bilateral hip replacements  History of chronic kidney disease  History of right thyroid nodule  Remote history of mitral valve prolapse  History of adenomatous colon polyp  Status post pancreaticoduodenectomy/Whipple procedure for benign tumor-takes Pancrease  History of back pain status post lumbar disc surgery  Plan: Patient is to return in 6 months for office visit lipid panel and TSH. She is on thyroid suppression therapy for thyroid nodule.      Subjective:   Patient presents for Medicare Annual/Subsequent preventive examination.   Review Past Medical/Family/Social: see EPIC   Risk Factors  Current exercise habits: walk Dietary issues discussed: low fat low carb diet  Cardiac risk factors:  Depression Screen  (Note: if answer to either of the following is "Yes", a more complete depression screening is indicated)   Over the past two weeks, have you felt down, depressed or hopeless? No  Over the past two weeks, have you felt little interest or pleasure in doing things? No Have you lost interest or pleasure in daily life? No Do you often feel hopeless? No Do you cry easily over simple problems? No   Activities of Daily Living  In your present state of health, do you have any difficulty performing the following activities?:   Driving? No  Managing money? No  Feeding yourself? No  Getting from bed to chair? No  Climbing a flight of stairs? No  Preparing food and eating?: No  Bathing or showering? No  Getting dressed: No  Getting to the toilet? No  Using the toilet:No  Moving around from place to place: No  In the past year have you fallen or had a near fall?:No  Are you sexually active? No  Do you have more than one partner? No   Hearing Difficulties: No  Do you often ask  people to speak up or repeat themselves? No  Do you experience ringing or noises in your ears? No  Do you have difficulty understanding soft or whispered voices? No  Do you feel that you have a problem with memory? No Do you often misplace items? No    Home Safety:  Do you have a smoke alarm at your residence? Yes Do you have grab bars in the bathroom? Do you have throw rugs in your house?   Cognitive Testing  Alert? Yes Normal Appearance?Yes  Oriented to person? Yes Place? Yes  Time? Yes  Recall of three objects? Yes  Can perform simple calculations? Yes  Displays appropriate judgment?Yes  Can read the correct time from a watch face?Yes   List the Names of Other Physician/Practitioners you currently use:  See referral list for the physicians patient is currently seeing. Dr. Idolina Primer for eyes goes yearly.    Review of Systems: See above   Objective:     General appearance: Appears stated age . Head: Normocephalic, without obvious abnormality, atraumatic  Eyes: conj clear, EOMi PEERLA  Ears: normal TM's and external ear canals both ears  Nose:  Nares normal. Septum midline. Mucosa normal. No drainage or sinus tenderness.  Throat: lips, mucosa, and tongue normal; teeth and gums normal  Neck: no adenopathy, no carotid bruit, no JVD, supple, symmetrical, trachea midline and thyroid not enlarged, symmetric, no tenderness/mass/nodules  No CVA tenderness.  Lungs: clear to auscultation bilaterally  Breasts: normal appearance, no masses or tenderness. Heart: regular rate and rhythm, S1, S2 normal, no murmur, click, rub or gallop  Abdomen: soft, non-tender; bowel sounds normal; no masses, no organomegaly  Musculoskeletal: ROM normal in all joints, no crepitus, no deformity, Normal muscle strengthen. Back  is symmetric, no curvature. Skin: Skin color, texture, turgor normal. No rashes or lesions  Lymph nodes: Cervical, supraclavicular, and axillary nodes normal.  Neurologic: CN 2 -12  Normal, Normal symmetric reflexes. Normal coordination and gait  Psych: Alert & Oriented x 3, Mood appear stable.    Assessment:    Annual wellness medicare exam   Plan:    During the course of the visit the patient was educated and counseled about appropriate screening and preventive services including:   Needs screening colonoscopy. Annual mammogram Not had recent bone density recently Diet and exercise Had Zostavax done at pharmacy     Patient Instructions (the written plan) was given to the patient.  Medicare Attestation  I have personally reviewed:  The patient's medical and social history  Their use of alcohol, tobacco or illicit drugs  Their current medications and supplements  The patient's functional ability including ADLs,fall risks, home safety risks, cognitive, and hearing and visual impairment  Diet and physical activities  Evidence for depression or mood disorders  The patient's weight, height, BMI, and visual acuity have been recorded in the chart. I have made referrals, counseling, and provided education to the patient based on review of the above and I have provided the patient with a written personalized care plan for preventive services.

## 2013-07-18 NOTE — Patient Instructions (Addendum)
Continue same medications and return in 6 months. Try to diet exercise and lose some weight. Recommend annual mammogram.

## 2013-09-08 ENCOUNTER — Ambulatory Visit (INDEPENDENT_AMBULATORY_CARE_PROVIDER_SITE_OTHER): Payer: Medicare Other | Admitting: Internal Medicine

## 2013-09-08 DIAGNOSIS — Z23 Encounter for immunization: Secondary | ICD-10-CM | POA: Diagnosis not present

## 2013-09-13 DIAGNOSIS — H2510 Age-related nuclear cataract, unspecified eye: Secondary | ICD-10-CM | POA: Diagnosis not present

## 2013-10-28 ENCOUNTER — Other Ambulatory Visit: Payer: Self-pay | Admitting: Internal Medicine

## 2013-11-04 ENCOUNTER — Other Ambulatory Visit: Payer: Self-pay | Admitting: *Deleted

## 2013-11-04 MED ORDER — ALLOPURINOL 100 MG PO TABS: ORAL_TABLET | ORAL | Status: DC

## 2013-11-04 MED ORDER — SERTRALINE HCL 50 MG PO TABS: ORAL_TABLET | ORAL | Status: DC

## 2013-12-18 ENCOUNTER — Other Ambulatory Visit: Payer: Self-pay | Admitting: Internal Medicine

## 2013-12-27 ENCOUNTER — Other Ambulatory Visit: Payer: Self-pay | Admitting: Internal Medicine

## 2014-01-10 ENCOUNTER — Other Ambulatory Visit: Payer: Medicare Other | Admitting: Internal Medicine

## 2014-01-10 DIAGNOSIS — I10 Essential (primary) hypertension: Secondary | ICD-10-CM | POA: Diagnosis not present

## 2014-01-10 DIAGNOSIS — E041 Nontoxic single thyroid nodule: Secondary | ICD-10-CM

## 2014-01-10 LAB — BASIC METABOLIC PANEL
BUN: 20 mg/dL (ref 6–23)
CO2: 21 mEq/L (ref 19–32)
Calcium: 9.8 mg/dL (ref 8.4–10.5)
Chloride: 109 mEq/L (ref 96–112)
Creat: 1.16 mg/dL — ABNORMAL HIGH (ref 0.50–1.10)
Glucose, Bld: 109 mg/dL — ABNORMAL HIGH (ref 70–99)
Potassium: 3.9 mEq/L (ref 3.5–5.3)
Sodium: 140 mEq/L (ref 135–145)

## 2014-01-10 LAB — TSH: TSH: 1.492 u[IU]/mL (ref 0.350–4.500)

## 2014-01-17 ENCOUNTER — Other Ambulatory Visit: Payer: Medicare Other | Admitting: Internal Medicine

## 2014-01-18 ENCOUNTER — Ambulatory Visit: Payer: Medicare Other | Admitting: Internal Medicine

## 2014-01-29 ENCOUNTER — Other Ambulatory Visit: Payer: Self-pay | Admitting: Internal Medicine

## 2014-04-29 ENCOUNTER — Other Ambulatory Visit: Payer: Self-pay | Admitting: Internal Medicine

## 2014-06-23 ENCOUNTER — Other Ambulatory Visit: Payer: Self-pay | Admitting: Internal Medicine

## 2014-08-31 ENCOUNTER — Other Ambulatory Visit: Payer: Self-pay

## 2014-08-31 MED ORDER — SERTRALINE HCL 50 MG PO TABS: ORAL_TABLET | ORAL | Status: DC

## 2014-08-31 MED ORDER — ALLOPURINOL 100 MG PO TABS: ORAL_TABLET | ORAL | Status: DC

## 2014-10-25 ENCOUNTER — Other Ambulatory Visit: Payer: Self-pay | Admitting: Internal Medicine

## 2014-11-26 ENCOUNTER — Other Ambulatory Visit: Payer: Self-pay | Admitting: Internal Medicine

## 2015-01-22 ENCOUNTER — Other Ambulatory Visit: Payer: Self-pay | Admitting: Internal Medicine

## 2015-01-22 NOTE — Telephone Encounter (Signed)
Needs CPE. No recent visit. Please contact pt.

## 2015-04-22 ENCOUNTER — Other Ambulatory Visit: Payer: Self-pay | Admitting: Internal Medicine

## 2015-05-02 ENCOUNTER — Other Ambulatory Visit: Payer: Self-pay | Admitting: Internal Medicine

## 2015-05-15 ENCOUNTER — Emergency Department (HOSPITAL_COMMUNITY)
Admission: EM | Admit: 2015-05-15 | Discharge: 2015-05-16 | Disposition: A | Discharge status: Home / Self Care | Payer: Medicare Other | Attending: Emergency Medicine | Admitting: Emergency Medicine

## 2015-05-15 ENCOUNTER — Emergency Department (HOSPITAL_COMMUNITY): Payer: Medicare Other

## 2015-05-15 ENCOUNTER — Encounter (HOSPITAL_COMMUNITY): Payer: Self-pay | Admitting: Emergency Medicine

## 2015-05-15 DIAGNOSIS — E079 Disorder of thyroid, unspecified: Secondary | ICD-10-CM | POA: Diagnosis not present

## 2015-05-15 DIAGNOSIS — E0590 Thyrotoxicosis, unspecified without thyrotoxic crisis or storm: Secondary | ICD-10-CM | POA: Diagnosis not present

## 2015-05-15 DIAGNOSIS — K5900 Constipation, unspecified: Secondary | ICD-10-CM | POA: Insufficient documentation

## 2015-05-15 DIAGNOSIS — R05 Cough: Secondary | ICD-10-CM

## 2015-05-15 DIAGNOSIS — Z79899 Other long term (current) drug therapy: Secondary | ICD-10-CM | POA: Insufficient documentation

## 2015-05-15 DIAGNOSIS — Z86010 Personal history of colonic polyps: Secondary | ICD-10-CM | POA: Insufficient documentation

## 2015-05-15 DIAGNOSIS — M1990 Unspecified osteoarthritis, unspecified site: Secondary | ICD-10-CM | POA: Insufficient documentation

## 2015-05-15 DIAGNOSIS — I10 Essential (primary) hypertension: Secondary | ICD-10-CM | POA: Diagnosis not present

## 2015-05-15 DIAGNOSIS — R21 Rash and other nonspecific skin eruption: Secondary | ICD-10-CM | POA: Insufficient documentation

## 2015-05-15 DIAGNOSIS — R42 Dizziness and giddiness: Secondary | ICD-10-CM | POA: Insufficient documentation

## 2015-05-15 DIAGNOSIS — Z87448 Personal history of other diseases of urinary system: Secondary | ICD-10-CM | POA: Insufficient documentation

## 2015-05-15 DIAGNOSIS — R55 Syncope and collapse: Secondary | ICD-10-CM | POA: Diagnosis not present

## 2015-05-15 DIAGNOSIS — E785 Hyperlipidemia, unspecified: Secondary | ICD-10-CM | POA: Insufficient documentation

## 2015-05-15 DIAGNOSIS — Z87891 Personal history of nicotine dependence: Secondary | ICD-10-CM | POA: Diagnosis not present

## 2015-05-15 DIAGNOSIS — M109 Gout, unspecified: Secondary | ICD-10-CM | POA: Diagnosis not present

## 2015-05-15 DIAGNOSIS — J9811 Atelectasis: Secondary | ICD-10-CM | POA: Diagnosis not present

## 2015-05-15 DIAGNOSIS — J189 Pneumonia, unspecified organism: Secondary | ICD-10-CM | POA: Diagnosis not present

## 2015-05-15 DIAGNOSIS — R059 Cough, unspecified: Secondary | ICD-10-CM

## 2015-05-15 DIAGNOSIS — R404 Transient alteration of awareness: Secondary | ICD-10-CM | POA: Diagnosis not present

## 2015-05-15 LAB — BASIC METABOLIC PANEL
Anion gap: 11 (ref 5–15)
BUN: 28 mg/dL — ABNORMAL HIGH (ref 6–20)
CO2: 22 mmol/L (ref 22–32)
Calcium: 9.7 mg/dL (ref 8.9–10.3)
Chloride: 104 mmol/L (ref 101–111)
Creatinine, Ser: 1.48 mg/dL — ABNORMAL HIGH (ref 0.44–1.00)
GFR calc Af Amer: 39 mL/min — ABNORMAL LOW (ref >60)
GFR calc non Af Amer: 34 mL/min — ABNORMAL LOW (ref >60)
Glucose, Bld: 173 mg/dL — ABNORMAL HIGH (ref 65–99)
Potassium: 3.6 mmol/L (ref 3.5–5.1)
Sodium: 137 mmol/L (ref 135–145)

## 2015-05-15 LAB — URINALYSIS, ROUTINE W REFLEX MICROSCOPIC
Glucose, UA: NEGATIVE mg/dL
Ketones, ur: NEGATIVE mg/dL
Nitrite: NEGATIVE
Protein, ur: 100 mg/dL — AB
Specific Gravity, Urine: 1.025 (ref 1.005–1.030)
Urobilinogen, UA: 1 mg/dL (ref 0.0–1.0)
pH: 5 (ref 5.0–8.0)

## 2015-05-15 LAB — URINE MICROSCOPIC-ADD ON

## 2015-05-15 LAB — CBC
HCT: 43.2 % (ref 36.0–46.0)
Hemoglobin: 14.7 g/dL (ref 12.0–15.0)
MCH: 28.8 pg (ref 26.0–34.0)
MCHC: 34 g/dL (ref 30.0–36.0)
MCV: 84.7 fL (ref 78.0–100.0)
Platelets: 172 10*3/uL (ref 150–400)
RBC: 5.1 MIL/uL (ref 3.87–5.11)
RDW: 12.7 % (ref 11.5–15.5)
WBC: 9.7 10*3/uL (ref 4.0–10.5)

## 2015-05-15 LAB — D-DIMER, QUANTITATIVE (NOT AT ARMC): D-Dimer, Quant: 0.51 ug/mL-FEU — ABNORMAL HIGH (ref 0.00–0.48)

## 2015-05-15 LAB — I-STAT TROPONIN, ED: Troponin i, poc: 0.04 ng/mL (ref 0.00–0.08)

## 2015-05-15 MED ORDER — AZITHROMYCIN 250 MG PO TABS: 250.0000 mg | ORAL_TABLET | Freq: Every day | ORAL | Status: DC

## 2015-05-15 MED ORDER — AZITHROMYCIN 250 MG PO TABS
500.0000 mg | ORAL_TABLET | Freq: Once | ORAL | Status: AC
  Administered 2015-05-15: 500 mg via ORAL
  Filled 2015-05-15: qty 2

## 2015-05-15 MED ORDER — SYNTHROID 112 MCG PO TABS: 112.0000 ug | ORAL_TABLET | Freq: Every day | ORAL | Status: DC

## 2015-05-15 MED ORDER — IOHEXOL 350 MG/ML SOLN
100.0000 mL | Freq: Once | INTRAVENOUS | Status: AC | PRN
  Administered 2015-05-15: 80 mL via INTRAVENOUS

## 2015-05-15 MED ORDER — PANCRELIPASE (LIP-PROT-AMYL) 10000 UNITS PO CPEP: ORAL_CAPSULE | ORAL | Status: DC

## 2015-05-15 NOTE — ED Provider Notes (Addendum)
CSN: [id number]     Arrival date & time 05/15/15  1852 History   First MD Initiated Contact with Patient 05/15/15 1945     Chief Complaint  Patient presents with  . Loss of Consciousness     (Consider location/radiation/quality/duration/timing/severity/associated sxs/prior Treatment) HPI Comments: Patient presents after syncopal episode. She states that she hasn't been feeling well for about 3 days. She states she's been under a lot of financial stress. She also developed shingles 3 days ago. She's had shingles before but redeveloped a rash 3 days ago to her left chest. She's been using Benadryl and hydrocortisone cream to the area. She states she did have a bowel movement for about 3 days but she did have a bowel movement this morning and feels better in that regard. She states that today she was sitting in a rocking chair and suddenly felt lightheaded and dizzy. Her family noticed that she slumped over in a rocking chair. She did not fall. She was unconscious for about 2 minutes. Following that she was back to baseline. Her family did check her pulse during this time and says that it was weak but was not fast or abnormally slow. She states that she did not have any chest pain palpitations or shortness of breath prior to the episode. She states that she feels much better now. She states that she's been eating and drinking normally. She states that she has not taken her blood pressure medicine about a week due to being out of it. She states her primary care physician won't refill it until she is seen in the office. There is no reported seizure activity with the syncope. She has no numbness or weakness to her extremities. There is no speech deficits.  Patient is a 74 y.o. female presenting with syncope.  Loss of Consciousness Associated symptoms: no chest pain, no diaphoresis, no dizziness, no fever, no headaches, no nausea, no shortness of breath, no vomiting and no weakness     Past Medical History   Diagnosis Date  . Hypertension   . Hyperlipidemia   . Thyroid disease   . Colon polyp   . Renal insufficiency   . Arthritis   . MVP (mitral valve prolapse)   . Hx of gout   . Hyperthyroidism    Past Surgical History  Procedure Laterality Date  . Back surgery  2011    LOWER BACK FUSION L4-5  . Neck surgery  2009    NECK FUSION C5-6; C6-7  . Cholecystectomy    . Microcystic adenoma  02/12    pancreas  . Total hip arthroplasty  01/15/2012    Procedure: TOTAL HIP ARTHROPLASTY;  Surgeon: Gearlean Alf, MD;  Location: [facility];  Service: Orthopedics;  Laterality: Right;  . Total hip arthroplasty  05/22/2012    Procedure: TOTAL HIP ARTHROPLASTY;  Surgeon: Gearlean Alf, MD;  Location: [facility];  Service: Orthopedics;  Laterality: Left;   Family History  Problem Relation Age of Onset  . Heart disease Father   . Diabetes Sister   . Cancer Brother     caused by agent orange.  . Cancer Paternal Aunt     breast   History  Substance Use Topics  . Smoking status: Former Smoker -- 0.25 packs/day    Types: Cigarettes    Quit date: 08/15/1965  . Smokeless tobacco: Never Used  . Alcohol Use: No   OB History    No data available     Review of Systems  Constitutional: Negative for fever, chills, diaphoresis and fatigue.  HENT: Negative for congestion, rhinorrhea and sneezing.   Eyes: Negative.   Respiratory: Negative for cough, chest tightness and shortness of breath.   Cardiovascular: Positive for syncope. Negative for chest pain and leg swelling.  Gastrointestinal: Positive for constipation. Negative for nausea, vomiting, abdominal pain, diarrhea and blood in stool.  Genitourinary: Negative for frequency, hematuria, flank pain and difficulty urinating.  Musculoskeletal: Negative for back pain and arthralgias.  Skin: Positive for rash.  Neurological: Positive for syncope and light-headedness. Negative for dizziness, speech difficulty, weakness, numbness and headaches.       Allergies  Codeine  Home Medications   Prior to Admission medications   Medication Sig Start Date End Date Taking? Authorizing Provider  allopurinol (ZYLOPRIM) 100 MG tablet TAKE 1 TABLET BY MOUTH EVERY DAY 08/31/14  Yes Elby Showers, MD  amLODipine (NORVASC) 10 MG tablet TAKE 1 TABLET BY MOUTH EVERY DAY 12/18/13  Yes Elby Showers, MD  atenolol (TENORMIN) 50 MG tablet TAKE 1 TABLET BY MOUTH EVERY DAY 01/22/15  Yes Elby Showers, MD  cholecalciferol (VITAMIN D) 1000 UNITS tablet Take 1,000 Units by mouth daily.   Yes Historical Provider, MD  ibuprofen (ADVIL,MOTRIN) 200 MG tablet Take 200 mg by mouth every 6 (six) hours as needed (for pain.).   Yes Historical Provider, MD  sertraline (ZOLOFT) 50 MG tablet TAKE 1 TABLET BY MOUTH EVERY DAY 08/31/14  Yes Elby Showers, MD  allopurinol (ZYLOPRIM) 100 MG tablet TAKE 1 TABLET BY MOUTH EVERY DAY Patient not taking: Reported on 05/15/2015 11/28/14   Elby Showers, MD  Pancrelipase, Lip-Prot-Amyl, (ZENPEP) 10000 UNITS CPEP TAKE 1 TO 4 CAPSULES BY MOUTH WITH MEALS/SNACKS AS DIRECTED 05/15/15   Malvin Johns, MD  SYNTHROID 112 MCG tablet Take 1 tablet (112 mcg total) by mouth daily. 05/15/15   Malvin Johns, MD   BP 134/67 mmHg  Pulse 86  Temp(Src) 98.3 F (36.8 C) (Oral)  Resp 19  SpO2 96% Physical Exam  Constitutional: She is oriented to person, place, and time. She appears well-developed and well-nourished.  HENT:  Head: Normocephalic and atraumatic.  Eyes: Pupils are equal, round, and reactive to light.  Neck: Normal range of motion. Neck supple.  Cardiovascular: Normal rate, regular rhythm and normal heart sounds.   Pulmonary/Chest: Effort normal and breath sounds normal. No respiratory distress. She has no wheezes. She has no rales. She exhibits no tenderness.  Abdominal: Soft. Bowel sounds are normal. There is no tenderness. There is no rebound and no guarding.  Musculoskeletal: Normal range of motion. She exhibits no edema.   Lymphadenopathy:    She has no cervical adenopathy.  Neurological: She is alert and oriented to person, place, and time.  Motor 5 out of 5 all extremities, sensation grossly intact to light touch all extremities, finger-nose intact, no pronator drift  Skin: Skin is warm and dry. No rash noted.  Psychiatric: She has a normal mood and affect.    ED Course  Procedures (including critical care time) Labs Review Labs Reviewed  BASIC METABOLIC PANEL - Abnormal; Notable for the following:    Glucose, Bld 173 (*)    BUN 28 (*)    Creatinine, Ser 1.48 (*)    GFR calc non Af Amer 34 (*)    GFR calc Af Amer 39 (*)    All other components within normal limits  D-DIMER, QUANTITATIVE (NOT AT [facility]) - Abnormal; Notable for the following:  D-Dimer, Quant 0.51 (*)    All other components within normal limits  URINALYSIS, ROUTINE W REFLEX MICROSCOPIC (NOT AT [facility]) - Abnormal; Notable for the following:    Color, Urine AMBER (*)    APPearance TURBID (*)    Hgb urine dipstick TRACE (*)    Bilirubin Urine MODERATE (*)    Protein, ur 100 (*)    Leukocytes, UA SMALL (*)    All other components within normal limits  URINE MICROSCOPIC-ADD ON - Abnormal; Notable for the following:    Bacteria, UA FEW (*)    Casts HYALINE CASTS (*)    Crystals CA OXALATE CRYSTALS (*)    All other components within normal limits  CBC  I-STAT TROPOININ, ED    Imaging Review Dg Chest 2 View  05/15/2015   CLINICAL DATA:  Syncope, dizziness, cough  EXAM: CHEST  2 VIEW  COMPARISON:  01/08/2012  FINDINGS: Lungs are clear.  No pleural effusion or pneumothorax.  The heart is normal in size.  Mild degenerative changes of the visualized thoracolumbar spine. Cervical spine fixation hardware.  IMPRESSION: No evidence of acute cardiopulmonary disease.   Electronically Signed   By: Julian Hy M.D.   On: 05/15/2015 21:29   Ct Angio Chest Pe W/cm &/or Wo Cm  05/15/2015   CLINICAL DATA:  Syncope today.  Elevated D-dimer.   EXAM: CT ANGIOGRAPHY CHEST WITH CONTRAST  TECHNIQUE: Multidetector CT imaging of the chest was performed using the standard protocol during bolus administration of intravenous contrast. Multiplanar CT image reconstructions and MIPs were obtained to evaluate the vascular anatomy.  CONTRAST:  79mL OMNIPAQUE IOHEXOL 350 MG/ML SOLN  COMPARISON:  Chest radiograph earlier this day  FINDINGS: There are no filling defects within the central pulmonary arteries to suggest pulmonary embolus. The subsegmental branches cannot be assessed due to contrast bolus timing.  The thoracic aorta is normal in caliber without evidence of aneurysm or dissection. There is mild moderate atherosclerosis of the arch. Left vertebral artery arises directly from the aorta, a normal variant.  Borderline cardiomegaly. No pleural or pericardial effusion. There is a prominent prevascular lymph node measuring 9 mm in short axis dimension. There is otherwise no mediastinal or hilar adenopathy. Tiny subcentimeter hypodensity in the right thyroid gland, of doubtful clinical significance. There is no axillary adenopathy.  Multifocal linear opacities in the right lower lobe, slightly more confluent in the superior segment. Minimal linear opacity in the left lower lobe. There is mild smooth septal thickening primarily of the lower lobes. Faint tree in bud opacities in the periphery of the right upper lobe. Mild subpleural reticulation in the lower lobes.  Evaluation of the included upper abdomen demonstrates no acute abnormality. There are no acute or suspicious osseous abnormalities. Cervical spine fusion is partially included. There is degenerative change in the spine.  Review of the MIP images confirms the above findings.  IMPRESSION: 1. No central pulmonary embolus. Subsegmental branches cannot be assessed due to contrast bolus timing. 2. Tree in bud opacities in the periphery of the right upper lobe consistent pneumonitis. This is likely infectious or  inflammatory. 3. Bibasilar atelectasis, right greater than left.   Electronically Signed   By: Jeb Levering M.D.   On: 05/15/2015 23:15     EKG Interpretation   Date/Time:  Monday May 15 2015 19:04:16 EDT Ventricular Rate:  82 PR Interval:  158 QRS Duration: 92 QT Interval:  386 QTC Calculation: 451 R Axis:   -62 Text Interpretation:  Sinus rhythm  Left anterior fascicular block  Confirmed by Timothey Dahlstrom  MD, Blessing Ozga ([id number]) on 05/15/2015 7:46:28 PM      MDM   Final diagnoses:  Cough  Syncope, unspecified syncope type  Pneumonitis    Patient presents after syncopal episode. She felt dizzy and lightheaded prior to the event. She had no palpitations chest pain or shortness of breath. Her EKG is unremarkable. She's feeling completely back to baseline now and is ambulating without difficulty. She has been out of her medications for about a week but her blood pressure has not been significantly elevated in the ED. She did have a mildly elevated d-dimer and CBC CT and she'll for her chest which did not show any evidence of pulmonary embolus. There was evidence of pneumonitis in the right upper lung. I will go ahead and treat her with Zithromax. She was encouraged to have close follow-up with her primary care physician. I do feel that with the symptoms she doesn't have any suggestions of cardiac arrhythmia or acute coronary syndrome. She has no neurologic deficits or evidence of a TIA versus stroke. I feel that she can be discharged home for further evaluation as an outpatient. She was given strict return precautions.  She is outside the window for Valtrex for treatment of shingles.   Malvin Johns, MD 05/15/15 [id number]  Malvin Johns, MD 05/15/15 [phone]

## 2015-05-15 NOTE — Discharge Instructions (Signed)
Syncope °Syncope is a medical term for fainting or passing out. This means you lose consciousness and drop to the ground. People are generally unconscious for less than 5 minutes. You may have some muscle twitches for up to 15 seconds before waking up and returning to normal. Syncope occurs more often in older adults, but it can happen to anyone. While most causes of syncope are not dangerous, syncope can be a sign of a serious medical problem. It is important to seek medical care.  °CAUSES  °Syncope is caused by a sudden drop in blood flow to the brain. The specific cause is often not determined. Factors that can bring on syncope include: °· Taking medicines that lower blood pressure. °· Sudden changes in posture, such as standing up quickly. °· Taking more medicine than prescribed. °· Standing in one place for too long. °· Seizure disorders. °· Dehydration and excessive exposure to heat. °· Low blood sugar (hypoglycemia). °· Straining to have a bowel movement. °· Heart disease, irregular heartbeat, or other circulatory problems. °· Fear, emotional distress, seeing blood, or severe pain. °SYMPTOMS  °Right before fainting, you may: °· Feel dizzy or light-headed. °· Feel nauseous. °· See all white or all black in your field of vision. °· Have cold, clammy skin. °DIAGNOSIS  °Your health care provider will ask about your symptoms, perform a physical exam, and perform an electrocardiogram (ECG) to record the electrical activity of your heart. Your health care provider may also perform other heart or blood tests to determine the cause of your syncope which may include: °· Transthoracic echocardiogram (TTE). During echocardiography, sound waves are used to evaluate how blood flows through your heart. °· Transesophageal echocardiogram (TEE). °· Cardiac monitoring. This allows your health care provider to monitor your heart rate and rhythm in real time. °· Holter monitor. This is a portable device that records your  heartbeat and can help diagnose heart arrhythmias. It allows your health care provider to track your heart activity for several days, if needed. °· Stress tests by exercise or by giving medicine that makes the heart beat faster. °TREATMENT  °In most cases, no treatment is needed. Depending on the cause of your syncope, your health care provider may recommend changing or stopping some of your medicines. °HOME CARE INSTRUCTIONS °· Have someone stay with you until you feel stable. °· Do not drive, use machinery, or play sports until your health care provider says it is okay. °· Keep all follow-up appointments as directed by your health care provider. °· Lie down right away if you start feeling like you might faint. Breathe deeply and steadily. Wait until all the symptoms have passed. °· Drink enough fluids to keep your urine clear or pale yellow. °· If you are taking blood pressure or heart medicine, get up slowly and take several minutes to sit and then stand. This can reduce dizziness. °SEEK IMMEDIATE MEDICAL CARE IF:  °· You have a severe headache. °· You have unusual pain in the chest, abdomen, or back. °· You are bleeding from your mouth or rectum, or you have black or tarry stool. °· You have an irregular or very fast heartbeat. °· You have pain with breathing. °· You have repeated fainting or seizure-like jerking during an episode. °· You faint when sitting or lying down. °· You have confusion. °· You have trouble walking. °· You have severe weakness. °· You have vision problems. °If you fainted, call your local emergency services (911 in [place] Do not drive   yourself to the hospital.  MAKE SURE YOU:  Understand these instructions.  Will watch your condition.  Will get help right away if you are not doing well or get worse. Document Released: 12/02/2005 Document Revised: 12/07/2013 Document Reviewed: 01/31/2012 [facility] Patient Information 2015 [place], [place]. This information is not intended to replace  advice given to you by your health care provider. Make sure you discuss any questions you have with your health care provider.  Pneumonia Pneumonia is an infection of the lungs.  CAUSES Pneumonia may be caused by bacteria or a virus. Usually, these infections are caused by breathing infectious particles into the lungs (respiratory tract). SIGNS AND SYMPTOMS   Cough.  Fever.  Chest pain.  Increased rate of breathing.  Wheezing.  Mucus production. DIAGNOSIS  If you have the common symptoms of pneumonia, your health care provider will typically confirm the diagnosis with a chest X-ray. The X-ray will show an abnormality in the lung (pulmonary infiltrate) if you have pneumonia. Other tests of your blood, urine, or sputum may be done to find the specific cause of your pneumonia. Your health care provider may also do tests (blood gases or pulse oximetry) to see how well your lungs are working. TREATMENT  Some forms of pneumonia may be spread to other people when you cough or sneeze. You may be asked to wear a mask before and during your exam. Pneumonia that is caused by bacteria is treated with antibiotic medicine. Pneumonia that is caused by the influenza virus may be treated with an antiviral medicine. Most other viral infections must run their course. These infections will not respond to antibiotics.  HOME CARE INSTRUCTIONS   Cough suppressants may be used if you are losing too much rest. However, coughing protects you by clearing your lungs. You should avoid using cough suppressants if you can.  Your health care provider may have prescribed medicine if he or she thinks your pneumonia is caused by bacteria or influenza. Finish your medicine even if you start to feel better.  Your health care provider may also prescribe an expectorant. This loosens the mucus to be coughed up.  Take medicines only as directed by your health care provider.  Do not smoke. Smoking is a common cause of  bronchitis and can contribute to pneumonia. If you are a smoker and continue to smoke, your cough may last several weeks after your pneumonia has cleared.  A cold steam vaporizer or humidifier in your room or home may help loosen mucus.  Coughing is often worse at night. Sleeping in a semi-upright position in a recliner or using a couple pillows under your head will help with this.  Get rest as you feel it is needed. Your body will usually let you know when you need to rest. PREVENTION A pneumococcal shot (vaccine) is available to prevent a common bacterial cause of pneumonia. This is usually suggested for:  People over 34 years old.  Patients on chemotherapy.  People with chronic lung problems, such as bronchitis or emphysema.  People with immune system problems. If you are over 65 or have a high risk condition, you may receive the pneumococcal vaccine if you have not received it before. In some countries, a routine influenza vaccine is also recommended. This vaccine can help prevent some cases of pneumonia.You may be offered the influenza vaccine as part of your care. If you smoke, it is time to quit. You may receive instructions on how to stop smoking. Your health care provider  can provide medicines and counseling to help you quit. SEEK MEDICAL CARE IF: You have a fever. SEEK IMMEDIATE MEDICAL CARE IF:   Your illness becomes worse. This is especially true if you are elderly or weakened from any other disease.  You cannot control your cough with suppressants and are losing sleep.  You begin coughing up blood.  You develop pain which is getting worse or is uncontrolled with medicines.  Any of the symptoms which initially brought you in for treatment are getting worse rather than better.  You develop shortness of breath or chest pain. MAKE SURE YOU:   Understand these instructions.  Will watch your condition.  Will get help right away if you are not doing well or get  worse. Document Released: 12/02/2005 Document Revised: 04/18/2014 Document Reviewed: 02/21/2011 [facility] Patient Information 2015 [place], [place]. This information is not intended to replace advice given to you by your health care provider. Make sure you discuss any questions you have with your health care provider.

## 2015-05-15 NOTE — ED Notes (Signed)
Bed: [id number] Expected date:  Expected time:  Means of arrival:  Comments: syncope

## 2015-05-15 NOTE — ED Notes (Signed)
Per EMS-loss of consciousness (syncopal episode) around 1802. Was sitting in the chair-remembers sitting down, laughing and then remembers being woken up by family. NP witnessed syncopal episode and reports thready pulse and shallow breathing. Reports loss of appetite, shingles developing and "bowel issues" x3 days ago. Remembers being dizzy and nauseous prior to syncopal episode. 12 lead unremarkable. Hx HTN ( not on medication-has an upcoming physical). VS: 124/98 HR 89 SpO2 95% CBG 153 mg/dl. Ate dinner prior to arrival. Has 20 G in [place].

## 2015-05-15 NOTE — ED Notes (Signed)
Per Josh PA, pt was sating 83% on room air, he placed pt on nasal canula.  O2 sats came up into 90's.

## 2015-05-16 ENCOUNTER — Other Ambulatory Visit: Payer: Self-pay | Admitting: Internal Medicine

## 2015-05-16 NOTE — ED Notes (Signed)
Patient is alert and oriented x3.  She was given DC instructions and follow up visit instructions.  Patient gave verbal understanding. She was DC ambulatory under her own power to home.  V/S stable.  He was not showing any signs of distress on DC 

## 2015-05-17 ENCOUNTER — Encounter: Payer: Self-pay | Admitting: Internal Medicine

## 2015-05-18 ENCOUNTER — Ambulatory Visit (INDEPENDENT_AMBULATORY_CARE_PROVIDER_SITE_OTHER): Payer: Medicare Other | Admitting: Internal Medicine

## 2015-05-18 ENCOUNTER — Encounter: Payer: Self-pay | Admitting: Internal Medicine

## 2015-05-18 VITALS — BP 118/68 | HR 71 | Temp 98.0°F | Ht 65.0 in | Wt 163.0 lb

## 2015-05-18 DIAGNOSIS — N183 Chronic kidney disease, stage 3 unspecified: Secondary | ICD-10-CM

## 2015-05-18 DIAGNOSIS — Z8679 Personal history of other diseases of the circulatory system: Secondary | ICD-10-CM

## 2015-05-18 DIAGNOSIS — D136 Benign neoplasm of pancreas: Secondary | ICD-10-CM

## 2015-05-18 DIAGNOSIS — R55 Syncope and collapse: Secondary | ICD-10-CM

## 2015-05-18 DIAGNOSIS — I10 Essential (primary) hypertension: Secondary | ICD-10-CM

## 2015-05-18 DIAGNOSIS — F32A Depression, unspecified: Secondary | ICD-10-CM

## 2015-05-18 DIAGNOSIS — M170 Bilateral primary osteoarthritis of knee: Secondary | ICD-10-CM

## 2015-05-18 DIAGNOSIS — B029 Zoster without complications: Secondary | ICD-10-CM | POA: Diagnosis not present

## 2015-05-18 DIAGNOSIS — Z9660 Presence of unspecified orthopedic joint implant: Secondary | ICD-10-CM

## 2015-05-18 DIAGNOSIS — Z96643 Presence of artificial hip joint, bilateral: Secondary | ICD-10-CM

## 2015-05-18 DIAGNOSIS — R7989 Other specified abnormal findings of blood chemistry: Secondary | ICD-10-CM | POA: Diagnosis not present

## 2015-05-18 DIAGNOSIS — E785 Hyperlipidemia, unspecified: Secondary | ICD-10-CM

## 2015-05-18 DIAGNOSIS — F329 Major depressive disorder, single episode, unspecified: Secondary | ICD-10-CM | POA: Diagnosis not present

## 2015-05-18 DIAGNOSIS — Z860101 Personal history of adenomatous and serrated colon polyps: Secondary | ICD-10-CM

## 2015-05-18 DIAGNOSIS — E041 Nontoxic single thyroid nodule: Secondary | ICD-10-CM

## 2015-05-18 DIAGNOSIS — Z86010 Personal history of colonic polyps: Secondary | ICD-10-CM | POA: Diagnosis not present

## 2015-05-18 DIAGNOSIS — E790 Hyperuricemia without signs of inflammatory arthritis and tophaceous disease: Secondary | ICD-10-CM
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
## 2015-05-19 ENCOUNTER — Other Ambulatory Visit: Payer: Self-pay | Admitting: Internal Medicine

## 2015-05-19 ENCOUNTER — Other Ambulatory Visit (INDEPENDENT_AMBULATORY_CARE_PROVIDER_SITE_OTHER): Payer: Medicare Other | Admitting: Internal Medicine

## 2015-05-19 DIAGNOSIS — Z79899 Other long term (current) drug therapy: Secondary | ICD-10-CM | POA: Diagnosis not present

## 2015-05-19 DIAGNOSIS — N189 Chronic kidney disease, unspecified: Secondary | ICD-10-CM | POA: Diagnosis not present

## 2015-05-19 DIAGNOSIS — E785 Hyperlipidemia, unspecified: Secondary | ICD-10-CM

## 2015-05-19 DIAGNOSIS — Z131 Encounter for screening for diabetes mellitus: Secondary | ICD-10-CM | POA: Diagnosis not present

## 2015-05-19 DIAGNOSIS — E039 Hypothyroidism, unspecified: Secondary | ICD-10-CM

## 2015-05-19 LAB — POCT URINALYSIS DIPSTICK
Bilirubin, UA: NEGATIVE
Blood, UA: NEGATIVE
Glucose, UA: NEGATIVE
Ketones, UA: NEGATIVE
Leukocytes, UA: NEGATIVE
Nitrite, UA: NEGATIVE
Spec Grav, UA: >=1.03
Urobilinogen, UA: NEGATIVE
pH, UA: 6

## 2015-05-19 LAB — COMPLETE METABOLIC PANEL WITH GFR
ALT: 16 U/L (ref 0–35)
AST: 15 U/L (ref 0–37)
Albumin: 4 g/dL (ref 3.5–5.2)
Alkaline Phosphatase: 70 U/L (ref 39–117)
BUN: 45 mg/dL — ABNORMAL HIGH (ref 6–23)
CO2: 22 mEq/L (ref 19–32)
Calcium: 9.4 mg/dL (ref 8.4–10.5)
Chloride: 104 mEq/L (ref 96–112)
Creat: 1.46 mg/dL — ABNORMAL HIGH (ref 0.50–1.10)
GFR, Est African American: 41 mL/min — ABNORMAL LOW
GFR, Est Non African American: 35 mL/min — ABNORMAL LOW
Glucose, Bld: 104 mg/dL — ABNORMAL HIGH (ref 70–99)
Potassium: 3.9 mEq/L (ref 3.5–5.3)
Sodium: 138 mEq/L (ref 135–145)
Total Bilirubin: 0.6 mg/dL (ref 0.2–1.2)
Total Protein: 6.4 g/dL (ref 6.0–8.3)

## 2015-05-19 LAB — LIPID PANEL
Cholesterol: 169 mg/dL (ref 0–200)
HDL: 45 mg/dL — ABNORMAL LOW (ref >=46)
LDL Cholesterol: 92 mg/dL (ref 0–99)
Total CHOL/HDL Ratio: 3.8 Ratio
Triglycerides: 161 mg/dL — ABNORMAL HIGH (ref <150)
VLDL: 32 mg/dL (ref 0–40)

## 2015-05-19 LAB — TSH: TSH: 0.859 u[IU]/mL (ref 0.350–4.500)

## 2015-05-19 LAB — T4, FREE: Free T4: 1.67 ng/dL (ref 0.80–1.80)

## 2015-05-22 LAB — HEMOGLOBIN A1C
Hgb A1c MFr Bld: 5.8 % — ABNORMAL HIGH (ref <5.7)
Mean Plasma Glucose: 120 mg/dL — ABNORMAL HIGH (ref <117)

## 2015-05-23 ENCOUNTER — Telehealth: Payer: Self-pay | Admitting: *Deleted

## 2015-05-23 ENCOUNTER — Other Ambulatory Visit: Payer: Self-pay | Admitting: Internal Medicine

## 2015-05-23 NOTE — Telephone Encounter (Signed)
Reviewed lab results with patient.

## 2015-06-01 ENCOUNTER — Encounter: Payer: Self-pay | Admitting: Internal Medicine

## 2015-06-01 ENCOUNTER — Ambulatory Visit (INDEPENDENT_AMBULATORY_CARE_PROVIDER_SITE_OTHER): Payer: Medicare Other | Admitting: Internal Medicine

## 2015-06-01 VITALS — BP 120/70 | HR 57 | Temp 97.8°F | Wt 165.0 lb

## 2015-06-01 DIAGNOSIS — F32A Depression, unspecified: Secondary | ICD-10-CM

## 2015-06-01 DIAGNOSIS — E041 Nontoxic single thyroid nodule: Secondary | ICD-10-CM | POA: Diagnosis not present

## 2015-06-01 DIAGNOSIS — I10 Essential (primary) hypertension: Secondary | ICD-10-CM | POA: Diagnosis not present

## 2015-06-01 DIAGNOSIS — K5900 Constipation, unspecified: Secondary | ICD-10-CM

## 2015-06-01 DIAGNOSIS — N184 Chronic kidney disease, stage 4 (severe): Secondary | ICD-10-CM

## 2015-06-01 DIAGNOSIS — F329 Major depressive disorder, single episode, unspecified: Secondary | ICD-10-CM | POA: Diagnosis not present

## 2015-06-01 DIAGNOSIS — E785 Hyperlipidemia, unspecified: Secondary | ICD-10-CM

## 2015-06-01 DIAGNOSIS — B029 Zoster without complications: Secondary | ICD-10-CM

## 2015-06-01 MED ORDER — TRIAMCINOLONE ACETONIDE 0.1 % EX CREA: 1.0000 "application " | TOPICAL_CREAM | Freq: Three times a day (TID) | CUTANEOUS | Status: DC

## 2015-06-01 NOTE — Progress Notes (Signed)
   Subjective:    Patient ID: [name], female    DOB: 1941-03-16, 74 y.o.   MRN: [id number]  HPI Two-week follow-up after initial evaluation status post discharge from emergency department for syncope. This was felt to be vasovagal syncope. She had not been eating well and was stressed with her family. She looks much better today. Her appetite is improved and she is feeling stronger. We did a number of lab tests including TSH which was normal. Potassium was normal. She does have a history of chronic kidney disease and creatinine was elevated at 1.46. Lipid panel normal with the exception of triglycerides of 161 fasting. Weight is stable.  She has been recovering from a bout of shingles for the past 3 weeks. Has some neuropathic pain and left trunk area. Has resolving lesions there that are itching.  No falls since last visit. Denies being depressed. Says her appetite is stable. Is not ambulating with a cane and is feeling more energetic. Living alone at the present time. [place].  Review of Systems  Constitutional: Negative for appetite change and fatigue.  Respiratory: Negative.   Cardiovascular: Negative.   Gastrointestinal:       Complaining of constipation  Neurological: Negative.   Psychiatric/Behavioral: Negative.        Objective:   Physical Exam  Resolving lesions consistent with herpes zoster left mid trunk. Neck is supple without JVD thyromegaly or carotid bruits. Chest clear to auscultation. Cardiac exam regular rate and rhythm. Extremities without edema. She is alert and oriented 3. Ambulating without a cane.      Assessment & Plan:  Syncope-likely vasovagal syncope  Chronic kidney disease-continue to follow creatinine  Essential hypertension-stable on current regimen  Thyroid nodule-on thyroid suppression therapy with normal TSH  Resolving herpes zoster  Hyperlipidemia-stable  Constipation-try MiraLAX daily  Plan: Return  in 3 months for Medicare wellness exam and reevaluation. Try triamcinolone cream on shingles rash 3 times daily  25 minutes spent with patient

## 2015-06-01 NOTE — Patient Instructions (Addendum)
Use triamcinolone cream on shingles lesions 3 times daily until healed. Return in 3 months for Medicare physical examination and fasting lab work. Try daily MiraLAX for constipation.

## 2015-06-02 ENCOUNTER — Other Ambulatory Visit: Payer: Self-pay | Admitting: *Deleted

## 2015-06-02 ENCOUNTER — Telehealth: Payer: Self-pay | Admitting: *Deleted

## 2015-06-02 DIAGNOSIS — R55 Syncope and collapse: Secondary | ICD-10-CM

## 2015-06-02 NOTE — Telephone Encounter (Signed)
Spoke with patient regarding CT scan scheduleing and Holter Monitor scheduling

## 2015-06-02 NOTE — Telephone Encounter (Signed)
Heart care has changed order numbers for heart monitors corrected order so they can schedule patient

## 2015-06-06 ENCOUNTER — Telehealth: Payer: Self-pay | Admitting: *Deleted

## 2015-06-06 ENCOUNTER — Ambulatory Visit
Admission: RE | Admit: 2015-06-06 | Discharge: 2015-06-06 | Disposition: A | Discharge status: Home / Self Care | Payer: Medicare Other | Source: Ambulatory Visit | Attending: Internal Medicine | Admitting: Internal Medicine

## 2015-06-06 DIAGNOSIS — R55 Syncope and collapse: Secondary | ICD-10-CM | POA: Diagnosis not present

## 2015-06-06 DIAGNOSIS — R51 Headache: Secondary | ICD-10-CM | POA: Diagnosis not present

## 2015-06-06 DIAGNOSIS — R930 Abnormal findings on diagnostic imaging of skull and head, not elsewhere classified: Secondary | ICD-10-CM

## 2015-06-07 NOTE — Telephone Encounter (Signed)
Scheduled patient with Dr Erik Obey Tuesday June 27, 2015 @ 9:30 patient to arrive 20 minutes prior to appt. Patient given appt information. Information faxed to Dr [facility] office.

## 2015-06-11 NOTE — Patient Instructions (Signed)
Restart medications as previously prescribed and return in 2 weeks.

## 2015-06-27 DIAGNOSIS — H6122 Impacted cerumen, left ear: Secondary | ICD-10-CM | POA: Diagnosis not present

## 2015-06-27 DIAGNOSIS — J320 Chronic maxillary sinusitis: Secondary | ICD-10-CM | POA: Diagnosis not present

## 2015-08-08 DIAGNOSIS — J320 Chronic maxillary sinusitis: Secondary | ICD-10-CM | POA: Diagnosis not present

## 2015-08-13 ENCOUNTER — Other Ambulatory Visit: Payer: Self-pay | Admitting: Internal Medicine

## 2015-08-26 ENCOUNTER — Other Ambulatory Visit: Payer: Self-pay | Admitting: Internal Medicine

## 2015-09-05 ENCOUNTER — Other Ambulatory Visit: Payer: PRIVATE HEALTH INSURANCE | Admitting: Internal Medicine

## 2015-09-07 ENCOUNTER — Ambulatory Visit (INDEPENDENT_AMBULATORY_CARE_PROVIDER_SITE_OTHER): Payer: Medicare Other | Admitting: Internal Medicine

## 2015-09-07 ENCOUNTER — Encounter: Payer: Self-pay | Admitting: Internal Medicine

## 2015-09-07 VITALS — BP 126/82 | HR 73 | Temp 98.2°F | Ht 65.0 in | Wt 172.0 lb

## 2015-09-07 DIAGNOSIS — N183 Chronic kidney disease, stage 3 unspecified: Secondary | ICD-10-CM

## 2015-09-07 DIAGNOSIS — Z23 Encounter for immunization: Secondary | ICD-10-CM

## 2015-09-07 DIAGNOSIS — Z9889 Other specified postprocedural states: Secondary | ICD-10-CM | POA: Diagnosis not present

## 2015-09-07 DIAGNOSIS — E785 Hyperlipidemia, unspecified: Secondary | ICD-10-CM | POA: Diagnosis not present

## 2015-09-07 DIAGNOSIS — Z0000 Encounter for general adult medical examination without abnormal findings: Secondary | ICD-10-CM | POA: Diagnosis not present

## 2015-09-07 DIAGNOSIS — J309 Allergic rhinitis, unspecified: Secondary | ICD-10-CM | POA: Diagnosis not present

## 2015-09-07 DIAGNOSIS — Z136 Encounter for screening for cardiovascular disorders: Secondary | ICD-10-CM | POA: Diagnosis not present

## 2015-09-07 DIAGNOSIS — M170 Bilateral primary osteoarthritis of knee: Secondary | ICD-10-CM

## 2015-09-07 DIAGNOSIS — Z79899 Other long term (current) drug therapy: Secondary | ICD-10-CM

## 2015-09-07 DIAGNOSIS — Z13220 Encounter for screening for lipoid disorders: Secondary | ICD-10-CM

## 2015-09-07 DIAGNOSIS — E039 Hypothyroidism, unspecified: Secondary | ICD-10-CM | POA: Diagnosis not present

## 2015-09-07 DIAGNOSIS — I10 Essential (primary) hypertension: Secondary | ICD-10-CM

## 2015-09-07 DIAGNOSIS — E559 Vitamin D deficiency, unspecified: Secondary | ICD-10-CM | POA: Diagnosis not present

## 2015-09-07 DIAGNOSIS — R5383 Other fatigue: Secondary | ICD-10-CM

## 2015-09-07 LAB — POCT URINALYSIS DIPSTICK
Bilirubin, UA: NEGATIVE
Blood, UA: NEGATIVE
Glucose, UA: NEGATIVE
Ketones, UA: NEGATIVE
Leukocytes, UA: NEGATIVE
Nitrite, UA: NEGATIVE
Spec Grav, UA: >=1.03
Urobilinogen, UA: NEGATIVE
pH, UA: 6

## 2015-09-07 LAB — COMPLETE METABOLIC PANEL WITH GFR
ALT: 19 U/L (ref 6–29)
AST: 19 U/L (ref 10–35)
Albumin: 4 g/dL (ref 3.6–5.1)
Alkaline Phosphatase: 69 U/L (ref 33–130)
BUN: 18 mg/dL (ref 7–25)
CO2: 21 mmol/L (ref 20–31)
Calcium: 9.8 mg/dL (ref 8.6–10.4)
Chloride: 109 mmol/L (ref 98–110)
Creat: 1.01 mg/dL — ABNORMAL HIGH (ref 0.60–0.93)
GFR, Est African American: 63 mL/min (ref >=60)
GFR, Est Non African American: 55 mL/min — ABNORMAL LOW (ref >=60)
Glucose, Bld: 101 mg/dL — ABNORMAL HIGH (ref 65–99)
Potassium: 4.6 mmol/L (ref 3.5–5.3)
Sodium: 141 mmol/L (ref 135–146)
Total Bilirubin: 0.4 mg/dL (ref 0.2–1.2)
Total Protein: 6.3 g/dL (ref 6.1–8.1)

## 2015-09-07 LAB — CBC WITH DIFFERENTIAL/PLATELET
Basophils Absolute: 0 10*3/uL (ref 0.0–0.1)
Basophils Relative: 0 % (ref 0–1)
Eosinophils Absolute: 0.1 10*3/uL (ref 0.0–0.7)
Eosinophils Relative: 1 % (ref 0–5)
HCT: 43.8 % (ref 36.0–46.0)
Hemoglobin: 14.5 g/dL (ref 12.0–15.0)
Lymphocytes Relative: 21 % (ref 12–46)
Lymphs Abs: 1.6 10*3/uL (ref 0.7–4.0)
MCH: 29.8 pg (ref 26.0–34.0)
MCHC: 33.1 g/dL (ref 30.0–36.0)
MCV: 90.1 fL (ref 78.0–100.0)
MPV: 11.5 fL (ref 8.6–12.4)
Monocytes Absolute: 0.6 10*3/uL (ref 0.1–1.0)
Monocytes Relative: 8 % (ref 3–12)
Neutro Abs: 5.2 10*3/uL (ref 1.7–7.7)
Neutrophils Relative %: 70 % (ref 43–77)
Platelets: 190 10*3/uL (ref 150–400)
RBC: 4.86 MIL/uL (ref 3.87–5.11)
RDW: 13.4 % (ref 11.5–15.5)
WBC: 7.4 10*3/uL (ref 4.0–10.5)

## 2015-09-07 LAB — LIPID PANEL
Cholesterol: 202 mg/dL — ABNORMAL HIGH (ref 125–200)
HDL: 78 mg/dL (ref >=46)
LDL Cholesterol: 104 mg/dL (ref <130)
Total CHOL/HDL Ratio: 2.6 Ratio (ref <=5.0)
Triglycerides: 102 mg/dL (ref <150)
VLDL: 20 mg/dL (ref <30)

## 2015-09-07 MED ORDER — LEVOTHYROXINE SODIUM 112 MCG PO TABS: 112.0000 ug | ORAL_TABLET | Freq: Every day | ORAL | Status: DC
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
## 2015-09-08 LAB — TSH: TSH: 2.971 u[IU]/mL (ref 0.350–4.500)

## 2015-09-08 LAB — VITAMIN D 25 HYDROXY (VIT D DEFICIENCY, FRACTURES): Vit D, 25-Hydroxy: 23 ng/mL — ABNORMAL LOW (ref 30–100)

## 2015-09-09 NOTE — Patient Instructions (Addendum)
Switch to generic thyroid preparation. Continue other medications as previously prescribed. Take 2000 units vitamin D 3 daily. Return in 6 months.  Return next week for Depo-Medrol injection for allergic rhinitis

## 2015-09-10 ENCOUNTER — Other Ambulatory Visit: Payer: Self-pay | Admitting: Internal Medicine

## 2015-09-13 ENCOUNTER — Ambulatory Visit (INDEPENDENT_AMBULATORY_CARE_PROVIDER_SITE_OTHER): Payer: Medicare Other | Admitting: Internal Medicine

## 2015-09-13 VITALS — BP 122/80 | Temp 98.0°F

## 2015-09-13 DIAGNOSIS — Z91048 Other nonmedicinal substance allergy status: Secondary | ICD-10-CM | POA: Diagnosis not present

## 2015-09-13 DIAGNOSIS — Z9109 Other allergy status, other than to drugs and biological substances: Secondary | ICD-10-CM

## 2015-09-13 MED ORDER — METHYLPREDNISOLONE ACETATE 80 MG/ML IJ SUSP
80.0000 mg | Freq: Once | INTRAMUSCULAR | Status: AC
  Administered 2015-09-13: 80 mg via INTRAMUSCULAR

## 2015-09-13 NOTE — Progress Notes (Signed)
Patient presents today for DepoMedrol injection.

## 2015-11-08 DIAGNOSIS — Z1231 Encounter for screening mammogram for malignant neoplasm of breast: Secondary | ICD-10-CM | POA: Diagnosis not present

## 2015-11-09 DIAGNOSIS — R42 Dizziness and giddiness: Secondary | ICD-10-CM | POA: Diagnosis not present

## 2015-11-09 DIAGNOSIS — R404 Transient alteration of awareness: Secondary | ICD-10-CM | POA: Diagnosis not present

## 2015-11-13 ENCOUNTER — Telehealth: Payer: Self-pay

## 2015-11-13 NOTE — Telephone Encounter (Signed)
She has vasovagal reactions which was explained to her after first episode. Told her it could be aggravated by anxiety. Told her when she felt that way she was to sit or lie down before she passed out.

## 2015-11-13 NOTE — Telephone Encounter (Signed)
Patient states that she has had 2 episodes where when she has ate more than she should have that she will pass out. She states that the first time it happened she went to ED and had a lot of tests ran and there was no cause determined. She states that she had another episode on Thanksgiving Day and that her sister in law who is a Therapist, sports told her that she has a "vaso-vein reaction". She states that she just wanted to talk to you about this and see what needs to be done. Patient CB# [phone].

## 2015-11-15 NOTE — Telephone Encounter (Signed)
Patient notified

## 2015-12-11 ENCOUNTER — Other Ambulatory Visit: Payer: Self-pay | Admitting: Internal Medicine

## 2015-12-27 ENCOUNTER — Encounter: Payer: Self-pay | Admitting: Internal Medicine

## 2015-12-27 ENCOUNTER — Ambulatory Visit (INDEPENDENT_AMBULATORY_CARE_PROVIDER_SITE_OTHER): Payer: Medicare Other | Admitting: Internal Medicine

## 2015-12-27 VITALS — BP 140/90 | HR 80 | Temp 98.4°F | Resp 24 | Wt 173.0 lb

## 2015-12-27 DIAGNOSIS — J069 Acute upper respiratory infection, unspecified: Secondary | ICD-10-CM | POA: Diagnosis not present

## 2015-12-27 MED ORDER — HYDROCODONE-HOMATROPINE 5-1.5 MG/5ML PO SYRP
5.0000 mL | ORAL_SOLUTION | Freq: Three times a day (TID) | ORAL | Status: DC | PRN
Start: 2015-12-27 — End: 2017-03-28

## 2015-12-27 MED ORDER — LEVOFLOXACIN 500 MG PO TABS: 500.0000 mg | ORAL_TABLET | Freq: Every day | ORAL | Status: DC

## 2015-12-27 MED ORDER — ALBUTEROL SULFATE HFA 108 (90 BASE) MCG/ACT IN AERS: 2.0000 | INHALATION_SPRAY | Freq: Four times a day (QID) | RESPIRATORY_TRACT | Status: DC | PRN

## 2015-12-27 NOTE — Progress Notes (Signed)
   Subjective:    Patient ID: [name], female    DOB: [birth year], 75 y.o.   MRN: [id number]  HPI  She called this morning wanting to be seen acutely with a one-week history of URI symptoms that will not go away. No fever or shaking chills. Has had discolored sputum production. Complaining of some difficulty breathing with shortness of breath. A lot of coughing. Relatives have had similar illness.    Review of Systems     Objective:   Physical Exam  Skin warm and dry. Nodes none. Pharynx is clear. TMs are pink and full bilaterally. Neck is supple. Chest clear to auscultation without rales or wheezing. No lower extremity edema. Cardiac exam regular rate and rhythm normal S1 and S2.      Assessment & Plan:  Acute bronchitis  Bilateral serous otitis media  Plan: Levaquin 500 milligrams daily for 7 days. Hycodan 1 teaspoon by mouth every 8 hours when necessary cough. Ventolin inhaler 2 sprays by mouth 4 times a day for shortness of breath. Call if not better in 7-10 days or sooner if worse.

## 2015-12-27 NOTE — Patient Instructions (Addendum)
Use Ventolin inhaler 2 sprays by mouth 4 times daily for shortness of breath. Hycodan 1 teaspoon by mouth every 8 hours when necessary cough. Levaquin 500 milligrams daily with meals for 7 days. Call if not better in 7-10 days or sooner if worse.

## 2016-02-12 ENCOUNTER — Other Ambulatory Visit: Payer: Self-pay | Admitting: Internal Medicine

## 2016-08-27 ENCOUNTER — Telehealth: Payer: Self-pay | Admitting: Internal Medicine

## 2016-08-27 NOTE — Telephone Encounter (Signed)
Pt not seen since 09/07/15. Please call and book CPE before refilling

## 2016-08-29 NOTE — Telephone Encounter (Signed)
Patient cannot be seen for CPE on 10/6.  We are currently booking for CPE's the end of November.  We always need to look in the book before you book in EPIC please.  I have had to re-schedule several appointments due to booking without looking in book.  Please make sure you check book prior to booking patient in EPIC.    Thank you.

## 2016-08-29 NOTE — Telephone Encounter (Signed)
Scheduled patient for CPE labs on Mon. 10/02. CPE on  Fri. 10/06. Refilled Atenolol for 30 days.

## 2016-09-04 NOTE — Telephone Encounter (Signed)
Appointment made for CPE for Labs on 10/2 and CPE on 10/6 @ 3pm.  Patient notified.

## 2016-09-16 ENCOUNTER — Other Ambulatory Visit (INDEPENDENT_AMBULATORY_CARE_PROVIDER_SITE_OTHER): Payer: Medicare Other | Admitting: Internal Medicine

## 2016-09-16 DIAGNOSIS — E559 Vitamin D deficiency, unspecified: Secondary | ICD-10-CM

## 2016-09-16 DIAGNOSIS — I10 Essential (primary) hypertension: Secondary | ICD-10-CM

## 2016-09-16 DIAGNOSIS — E785 Hyperlipidemia, unspecified: Secondary | ICD-10-CM

## 2016-09-16 LAB — CBC WITH DIFFERENTIAL/PLATELET
Basophils Absolute: 0 cells/uL (ref 0–200)
Basophils Relative: 0 %
Eosinophils Absolute: 88 cells/uL (ref 15–500)
Eosinophils Relative: 1 %
HCT: 43.1 % (ref 35.0–45.0)
Hemoglobin: 14.5 g/dL (ref 11.7–15.5)
Lymphocytes Relative: 21 %
Lymphs Abs: 1848 cells/uL (ref 850–3900)
MCH: 30 pg (ref 27.0–33.0)
MCHC: 33.6 g/dL (ref 32.0–36.0)
MCV: 89 fL (ref 80.0–100.0)
MPV: 10.8 fL (ref 7.5–12.5)
Monocytes Absolute: 616 cells/uL (ref 200–950)
Monocytes Relative: 7 %
Neutro Abs: 6248 cells/uL (ref 1500–7800)
Neutrophils Relative %: 71 %
Platelets: 184 10*3/uL (ref 140–400)
RBC: 4.84 MIL/uL (ref 3.80–5.10)
RDW: 13.2 % (ref 11.0–15.0)
WBC: 8.8 10*3/uL (ref 3.8–10.8)

## 2016-09-16 LAB — LIPID PANEL
Cholesterol: 219 mg/dL — ABNORMAL HIGH (ref 125–200)
HDL: 66 mg/dL (ref >=46)
LDL Cholesterol: 122 mg/dL (ref <130)
Total CHOL/HDL Ratio: 3.3 Ratio (ref <=5.0)
Triglycerides: 153 mg/dL — ABNORMAL HIGH (ref <150)
VLDL: 31 mg/dL — ABNORMAL HIGH (ref <30)

## 2016-09-16 LAB — COMPREHENSIVE METABOLIC PANEL
ALT: 15 U/L (ref 6–29)
AST: 16 U/L (ref 10–35)
Albumin: 3.9 g/dL (ref 3.6–5.1)
Alkaline Phosphatase: 80 U/L (ref 33–130)
BUN: 22 mg/dL (ref 7–25)
CO2: 22 mmol/L (ref 20–31)
Calcium: 10.1 mg/dL (ref 8.6–10.4)
Chloride: 109 mmol/L (ref 98–110)
Creat: 1.26 mg/dL — ABNORMAL HIGH (ref 0.60–0.93)
Glucose, Bld: 108 mg/dL — ABNORMAL HIGH (ref 65–99)
Potassium: 5.6 mmol/L — ABNORMAL HIGH (ref 3.5–5.3)
Sodium: 144 mmol/L (ref 135–146)
Total Bilirubin: 0.7 mg/dL (ref 0.2–1.2)
Total Protein: 6.4 g/dL (ref 6.1–8.1)

## 2016-09-16 LAB — TSH: TSH: 2.13 mIU/L

## 2016-09-17 LAB — VITAMIN D 25 HYDROXY (VIT D DEFICIENCY, FRACTURES): Vit D, 25-Hydroxy: 25 ng/mL — ABNORMAL LOW (ref 30–100)

## 2016-09-19 ENCOUNTER — Other Ambulatory Visit: Payer: Self-pay | Admitting: Internal Medicine

## 2016-09-19 NOTE — Telephone Encounter (Signed)
Verbal order by Dr. Renold Genta.  Refill Zoloft 50mg .  #90, no refill.  Spoke with Geni Bers @ [facility] @ [phone].

## 2016-09-20 ENCOUNTER — Ambulatory Visit (INDEPENDENT_AMBULATORY_CARE_PROVIDER_SITE_OTHER): Payer: Medicare Other | Admitting: Internal Medicine

## 2016-09-20 ENCOUNTER — Encounter: Payer: Self-pay | Admitting: Internal Medicine

## 2016-09-20 VITALS — BP 144/96 | HR 78 | Temp 99.0°F | Ht 63.5 in | Wt 188.0 lb

## 2016-09-20 DIAGNOSIS — E876 Hypokalemia: Secondary | ICD-10-CM

## 2016-09-20 DIAGNOSIS — Z0000 Encounter for general adult medical examination without abnormal findings: Secondary | ICD-10-CM | POA: Diagnosis not present

## 2016-09-20 DIAGNOSIS — N183 Chronic kidney disease, stage 3 unspecified: Secondary | ICD-10-CM

## 2016-09-20 DIAGNOSIS — Z9889 Other specified postprocedural states: Secondary | ICD-10-CM

## 2016-09-20 DIAGNOSIS — F32A Depression, unspecified: Secondary | ICD-10-CM

## 2016-09-20 DIAGNOSIS — E559 Vitamin D deficiency, unspecified: Secondary | ICD-10-CM

## 2016-09-20 DIAGNOSIS — F329 Major depressive disorder, single episode, unspecified: Secondary | ICD-10-CM | POA: Diagnosis not present

## 2016-09-20 DIAGNOSIS — R7989 Other specified abnormal findings of blood chemistry: Secondary | ICD-10-CM

## 2016-09-20 DIAGNOSIS — Z23 Encounter for immunization: Secondary | ICD-10-CM | POA: Diagnosis not present

## 2016-09-20 DIAGNOSIS — Z8679 Personal history of other diseases of the circulatory system: Secondary | ICD-10-CM

## 2016-09-20 DIAGNOSIS — Z86010 Personal history of colonic polyps: Secondary | ICD-10-CM

## 2016-09-20 DIAGNOSIS — E782 Mixed hyperlipidemia: Secondary | ICD-10-CM

## 2016-09-20 LAB — POCT URINALYSIS DIPSTICK
Bilirubin, UA: NEGATIVE
Blood, UA: NEGATIVE
Glucose, UA: NEGATIVE
Ketones, UA: NEGATIVE
Leukocytes, UA: NEGATIVE
Nitrite, UA: NEGATIVE
Protein, UA: NEGATIVE
Spec Grav, UA: <=1.005
Urobilinogen, UA: NEGATIVE
pH, UA: 5

## 2016-09-20 LAB — CREATININE, SERUM: Creat: 1.81 mg/dL — ABNORMAL HIGH (ref 0.60–0.93)

## 2016-09-20 LAB — POTASSIUM: Potassium: 4.4 mmol/L (ref 3.5–5.3)

## 2016-09-28 ENCOUNTER — Other Ambulatory Visit: Payer: Self-pay | Admitting: Internal Medicine

## 2016-10-13 NOTE — Patient Instructions (Signed)
Continue same medications. Encouraged diet and exercise. Return in 6 months for follow-up on essential hypertension, hyperlipidemia, chronic kidney disease and other medical issues.

## 2016-10-13 NOTE — Progress Notes (Signed)
Subjective:    Patient ID: [name], female    DOB: June 01, 1941, 75 y.o.   MRN: [id number]  HPI 75 year old Female in today for health maintenance exam and evaluation of medical issues. Patient remains depressed. Has financial stress. All of this started when she loaned her sister money.  No more syncopal episodes. She has a history of hypertension, hyperlipidemia, osteoarthritis for hip status post bilateral hip replacement surgery by Dr. Mal Misty oh. Right hip was replaced January 2013 and let hip was replaced June 2013. History of chronic kidney disease, right thyroid nodule. Remote history of mitral valve prolapse. History of adenomatous colon polyp.  In February 2012 she had a pancreaticoduodenectomy and pancreatic stent placed with Whipple procedure for benign tumor. She had a serous microcystic adenoma the pancreas. She is now on Pancrease. If she doesn't take Pancrease, she has diarrhea. In 2007-03-27 she weighed 185 pounds but by January 2012 she had lost down to 132 pounds. Now weighs 188 pounds. Not really motivated to diet and exercise.  History of osteoarthritis of her knees. Had colonoscopy 2005/03/26. Pneumovax vaccine March 26, 2005. Tetanus immunization Mar 26, 2002.  History of lumbar fusion L4-L5 in June 2011. Had anterior cervical decompression C5-C6 and C6-C7 with fusion in March 27, 2007.  Had Bartholin's cyst removed over 30 years ago.  In 2008-03-26 was found to have an elevation of creatinine at 1.4. She has a history of elevated uric acid. We started her on allopurinol in 2008/03/26. History of pyelonephritis in 2008-03-26 treated as an outpatient.  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 No children. Does not smoke. Occasionally will drink some wine.  Family history: Mother died in 8119 from complications of dementia with history of stroke. Brother died from kidney cancer. Father died in 03/27/1999 with congestive heart failure. One brother in good health. One sister  with fibromyalgia. Another sister in good health.  Patient had syncopal episode in May 2016 when she was under stress. She had a negative workup at that time. She had herpes zoster affecting her left trunk at the same time his syncope.    Review of Systems depression, fatigue, musculoskeletal pain     Objective:   Physical Exam  Constitutional: She is oriented to person, place, and time. She appears well-developed and well-nourished. No distress.  HENT:  Head: Normocephalic and atraumatic.  Right Ear: External ear normal.  Left Ear: External ear normal.  Mouth/Throat: Oropharynx is clear and moist. No oropharyngeal exudate.  Eyes: Conjunctivae and EOM are normal. Pupils are equal, round, and reactive to light. Right eye exhibits no discharge. Left eye exhibits no discharge.  Neck: Neck supple. No JVD present. No thyromegaly present.  Cardiovascular: Normal rate, regular rhythm and normal heart sounds.   No murmur heard. Pulmonary/Chest: She has no wheezes. She has no rales.  Abdominal: Soft. Bowel sounds are normal. She exhibits no distension and no mass. There is no tenderness. There is no rebound and no guarding.  Musculoskeletal: She exhibits no edema.  Lymphadenopathy:    She has no cervical adenopathy.  Neurological: She is alert and oriented to person, place, and time. She has normal reflexes. No cranial nerve deficit.  Skin: Skin is warm and dry. No rash noted. She is not diaphoretic.  Multiple seborrheic keratoses  Psychiatric: She has a normal mood and affect. Her behavior is normal. Judgment and thought content normal.  Vitals reviewed.         Assessment & Plan:  Essential hypertension  Hyperlipidemia-diet controlled. Total cholesterol 219, triglycerides 153, LDL cholesterol 122, HDL cholesterol 66  Obesity  Musculoskeletal pain  Osteoarthritis both knees  History of syncope  History of pancreaticoduodenectomy and pancreatic stent with Whipple procedure  2012 for benign tumor  History of depression-continue Zoloft.  Hypothyroidism  Chronic kidney disease-continue to monitor. Creatinine 1.81. Previous creatinine June 2016 was 1.46.  History of allergic rhinitis  Vitamin D deficiency-take 2000 units vitamin D 3 daily  Abnormal serum potassium-repeated and is 4.4. Initial value I think was hemolyzed.  Plan: Return in 6 months or as needed.  Subjective:   Patient presents for Medicare Annual/Subsequent preventive examination.  Review Past Medical/Family/Social:See above   Risk Factors  Current exercise habits: Sedentary Dietary issues discussed: Low fat low carbohydrate discussed  Cardiac risk factors:Hyperlipidemia hypertension history of stroke in mother and father with congestive heart failure  Depression Screen  (Note: if answer to either of the following is "Yes", a more complete depression screening is indicated)   Over the past two weeks, have you felt down, depressed or hopeless? No  Over the past two weeks, have you felt little interest or pleasure in doing things? No Have you lost interest or pleasure in daily life? No Do you often feel hopeless? Yes due to financial issues but working this out Do you cry easily over simple problems? No   Activities of Daily Living  In your present state of health, do you have any difficulty performing the following activities?:   Driving? No  Managing money? Yes issues with credit cards Feeding yourself? No  Getting from bed to chair? No  Climbing a flight of stairs? No  Preparing food and eating?: No  Bathing or showering? No  Getting dressed: No  Getting to the toilet? No  Using the toilet:No  Moving around from place to place: No  In the past year have you fallen or had a near fall?:No  Are you sexually active? No  Do you have more than one partner? No   Hearing Difficulties: No  Do you often ask people to speak up or repeat themselves? No  Do you experience ringing  or noises in your ears? Yes Do you have difficulty understanding soft or whispered voices? No  Do you feel that you have a problem with memory? Sometimes Do you often misplace items? No    Home Safety:  Do you have a smoke alarm at your residence? Yes Do you have grab bars in the bathroom?Yes Do you have throw rugs in your house? Yes   Cognitive Testing  Alert? Yes Normal Appearance?Yes  Oriented to person? Yes Place? Yes  Time? Yes  Recall of three objects? Yes  Can perform simple calculations? Yes  Displays appropriate judgment?Yes  Can read the correct time from a watch face?Yes   List the Names of Other Physician/Practitioners you currently use:  See referral list for the physicians patient is currently seeing.     Review of Systems: See above   Objective:     General appearance: Appears stated age and mildly obese  Head: Normocephalic, without obvious abnormality, atraumatic  Eyes: conj clear, EOMi PEERLA  Ears: normal TM's and external ear canals both ears  Nose: Nares normal. Septum midline. Mucosa normal. No drainage or sinus tenderness.  Throat: lips, mucosa, and tongue normal; teeth and gums normal  Neck: no adenopathy, no carotid bruit, no JVD, supple, symmetrical, trachea midline and thyroid not enlarged, symmetric, no tenderness/mass/nodules  No CVA  tenderness.  Lungs: clear to auscultation bilaterally  Breasts: normal appearance, no masses or tenderness,  Heart: regular rate and rhythm, S1, S2 normal, no murmur, click, rub or gallop  Abdomen: soft, non-tender; bowel sounds normal; no masses, no organomegaly  Musculoskeletal: ROM normal in all joints, no crepitus, no deformity, Normal muscle strengthen. Back  is symmetric, no curvature. Skin: Skin color, texture, turgor normal. No rashes or lesions  Lymph nodes: Cervical, supraclavicular, and axillary nodes normal.  Neurologic: CN 2 -12 Normal, Normal symmetric reflexes. Normal coordination and gait    Psych: Alert & Oriented x 3, Mood appear stable.    Assessment:    Annual wellness medicare exam   Plan:    During the course of the visit the patient was educated and counseled about appropriate screening and preventive services including:   Recommend annual flu vaccine and mammogram     Patient Instructions (the written plan) was given to the patient.  Medicare Attestation  I have personally reviewed:  The patient's medical and social history  Their use of alcohol, tobacco or illicit drugs  Their current medications and supplements  The patient's functional ability including ADLs,fall risks, home safety risks, cognitive, and hearing and visual impairment  Diet and physical activities  Evidence for depression or mood disorders  The patient's weight, height, BMI, and visual acuity have been recorded in the chart. I have made referrals, counseling, and provided education to the patient based on review of the above and I have provided the patient with a written personalized care plan for preventive services.

## 2016-10-14 ENCOUNTER — Other Ambulatory Visit: Payer: Self-pay | Admitting: *Deleted

## 2016-10-14 MED ORDER — ATENOLOL 50 MG PO TABS: 50.0000 mg | ORAL_TABLET | Freq: Every day | ORAL | 3 refills | Status: DC

## 2016-11-11 DIAGNOSIS — Z1231 Encounter for screening mammogram for malignant neoplasm of breast: Secondary | ICD-10-CM | POA: Diagnosis not present

## 2016-12-20 ENCOUNTER — Other Ambulatory Visit: Payer: Self-pay | Admitting: Internal Medicine

## 2017-03-25 ENCOUNTER — Other Ambulatory Visit: Payer: Medicare Other | Admitting: Internal Medicine

## 2017-03-26 ENCOUNTER — Other Ambulatory Visit: Payer: Self-pay | Admitting: Internal Medicine

## 2017-03-27 ENCOUNTER — Ambulatory Visit: Payer: Medicare Other | Admitting: Internal Medicine

## 2017-03-28 ENCOUNTER — Ambulatory Visit (INDEPENDENT_AMBULATORY_CARE_PROVIDER_SITE_OTHER): Payer: Medicare Other | Admitting: Internal Medicine

## 2017-03-28 ENCOUNTER — Encounter: Payer: Self-pay | Admitting: Internal Medicine

## 2017-03-28 VITALS — BP 120/80 | HR 57 | Temp 97.5°F | Wt 182.0 lb

## 2017-03-28 DIAGNOSIS — J22 Unspecified acute lower respiratory infection: Secondary | ICD-10-CM | POA: Diagnosis not present

## 2017-03-28 DIAGNOSIS — J069 Acute upper respiratory infection, unspecified: Secondary | ICD-10-CM

## 2017-03-28 MED ORDER — HYDROCODONE-HOMATROPINE 5-1.5 MG/5ML PO SYRP: 5.0000 mL | ORAL_SOLUTION | Freq: Three times a day (TID) | ORAL | 0 refills | Status: DC | PRN

## 2017-03-28 MED ORDER — LEVOFLOXACIN 500 MG PO TABS: 500.0000 mg | ORAL_TABLET | Freq: Every day | ORAL | 0 refills | Status: DC

## 2017-03-28 MED ORDER — CEFTRIAXONE SODIUM 1 G IJ SOLR
1.0000 g | Freq: Once | INTRAMUSCULAR | Status: AC
  Administered 2017-03-28: 1 g via INTRAMUSCULAR

## 2017-03-28 MED ORDER — ALBUTEROL SULFATE HFA 108 (90 BASE) MCG/ACT IN AERS: 2.0000 | INHALATION_SPRAY | Freq: Four times a day (QID) | RESPIRATORY_TRACT | 0 refills | Status: DC | PRN

## 2017-03-28 NOTE — Patient Instructions (Signed)
Levaquin 500 milligrams daily for 10 days. Given 1 g IM ceftriaxone. Symbicort 164.51 spray by mouth every 12 hours. Refill albuterol inhaler. Rest and drink plenty of fluids. Start eating better.

## 2017-03-28 NOTE — Progress Notes (Signed)
   Subjective:    Patient ID: [name], female    DOB: Apr 19, 1941, 76 y.o.   MRN: [id number]  HPI 2 week history of URI symptoms. Has had cough and congestion. Started mainly as a head cold but now has some wheezing. Discolored sputum production. No fever but feels chilly. No myalgias. Appetite decreased. She is lost 6 pounds since October 2017.   Review of Systems     Objective:   Physical Exam  Faint inspiratory wheezing throughout both lungs. No rales appreciated. Right TM is clear. Left TM is centrally injected. Pharynx is clear. Neck supple.      Assessment & Plan:  Acute bronchitis  Dementia-patient seems a little confused today. She lost her inhaler after we gave her a sample of Symbicort. It was in her purse.  Plan: Symbicort 160/4.5   1 spray by mouth every 12 hours when necessary wheezing. Rocephin 1 g IM. Levaquin 500 milligrams daily for 10 days. Refill albuterol inhaler 2 sprays by mouth 4 times daily. Encourage patient to eat better. Call if not better next week or sooner if worse.

## 2017-07-11 ENCOUNTER — Other Ambulatory Visit: Payer: Self-pay

## 2017-10-18 ENCOUNTER — Other Ambulatory Visit: Payer: Self-pay | Admitting: Internal Medicine

## 2017-11-19 ENCOUNTER — Other Ambulatory Visit: Payer: Self-pay | Admitting: Internal Medicine

## 2017-11-19 DIAGNOSIS — I10 Essential (primary) hypertension: Secondary | ICD-10-CM

## 2017-11-19 DIAGNOSIS — M1990 Unspecified osteoarthritis, unspecified site: Secondary | ICD-10-CM

## 2017-11-19 DIAGNOSIS — E785 Hyperlipidemia, unspecified: Secondary | ICD-10-CM

## 2017-11-19 DIAGNOSIS — E041 Nontoxic single thyroid nodule: Secondary | ICD-10-CM

## 2017-11-19 DIAGNOSIS — Z0000 Encounter for general adult medical examination without abnormal findings: Secondary | ICD-10-CM

## 2017-11-24 ENCOUNTER — Other Ambulatory Visit: Payer: Medicare Other | Admitting: Internal Medicine

## 2017-11-27 ENCOUNTER — Other Ambulatory Visit: Payer: Medicare Other | Admitting: Internal Medicine

## 2017-11-28 ENCOUNTER — Encounter: Payer: Medicare Other | Admitting: Internal Medicine

## 2017-12-03 ENCOUNTER — Other Ambulatory Visit: Payer: Self-pay | Admitting: Internal Medicine

## 2017-12-03 DIAGNOSIS — R7302 Impaired glucose tolerance (oral): Secondary | ICD-10-CM

## 2017-12-05 ENCOUNTER — Other Ambulatory Visit: Payer: Medicare Other | Admitting: Internal Medicine

## 2017-12-05 DIAGNOSIS — M1990 Unspecified osteoarthritis, unspecified site: Secondary | ICD-10-CM

## 2017-12-05 DIAGNOSIS — I10 Essential (primary) hypertension: Secondary | ICD-10-CM

## 2017-12-05 DIAGNOSIS — Z0000 Encounter for general adult medical examination without abnormal findings: Secondary | ICD-10-CM

## 2017-12-05 DIAGNOSIS — Z86010 Personal history of colonic polyps: Secondary | ICD-10-CM | POA: Diagnosis not present

## 2017-12-05 DIAGNOSIS — R7989 Other specified abnormal findings of blood chemistry: Secondary | ICD-10-CM | POA: Diagnosis not present

## 2017-12-05 DIAGNOSIS — R7302 Impaired glucose tolerance (oral): Secondary | ICD-10-CM | POA: Diagnosis not present

## 2017-12-05 DIAGNOSIS — E041 Nontoxic single thyroid nodule: Secondary | ICD-10-CM | POA: Diagnosis not present

## 2017-12-05 DIAGNOSIS — E785 Hyperlipidemia, unspecified: Secondary | ICD-10-CM

## 2017-12-05 DIAGNOSIS — E2839 Other primary ovarian failure: Secondary | ICD-10-CM | POA: Diagnosis not present

## 2017-12-05 DIAGNOSIS — F329 Major depressive disorder, single episode, unspecified: Secondary | ICD-10-CM | POA: Diagnosis not present

## 2017-12-05 DIAGNOSIS — J309 Allergic rhinitis, unspecified: Secondary | ICD-10-CM | POA: Diagnosis not present

## 2017-12-05 DIAGNOSIS — E782 Mixed hyperlipidemia: Secondary | ICD-10-CM | POA: Diagnosis not present

## 2017-12-05 DIAGNOSIS — N183 Chronic kidney disease, stage 3 (moderate): Secondary | ICD-10-CM | POA: Diagnosis not present

## 2017-12-05 NOTE — Addendum Note (Signed)
Addended by: Mady Haagensen on: 12/05/2017 09:25 AM   Modules accepted: Orders

## 2017-12-06 LAB — CBC WITH DIFFERENTIAL/PLATELET
Basophils Absolute: 22 cells/uL (ref 0–200)
Basophils Relative: 0.3 %
Eosinophils Absolute: 79 cells/uL (ref 15–500)
Eosinophils Relative: 1.1 %
HCT: 41.9 % (ref 35.0–45.0)
Hemoglobin: 14.1 g/dL (ref 11.7–15.5)
Lymphs Abs: 1519 cells/uL (ref 850–3900)
MCH: 29.3 pg (ref 27.0–33.0)
MCHC: 33.7 g/dL (ref 32.0–36.0)
MCV: 87.1 fL (ref 80.0–100.0)
MPV: 12 fL (ref 7.5–12.5)
Monocytes Relative: 6.4 %
Neutro Abs: 5119 cells/uL (ref 1500–7800)
Neutrophils Relative %: 71.1 %
Platelets: 163 10*3/uL (ref 140–400)
RBC: 4.81 10*6/uL (ref 3.80–5.10)
RDW: 12 % (ref 11.0–15.0)
Total Lymphocyte: 21.1 %
WBC mixed population: 461 cells/uL (ref 200–950)
WBC: 7.2 10*3/uL (ref 3.8–10.8)

## 2017-12-06 LAB — COMPLETE METABOLIC PANEL WITH GFR
AG Ratio: 1.5 (calc) (ref 1.0–2.5)
ALT: 15 U/L (ref 6–29)
AST: 19 U/L (ref 10–35)
Albumin: 3.7 g/dL (ref 3.6–5.1)
Alkaline phosphatase (APISO): 55 U/L (ref 33–130)
BUN/Creatinine Ratio: 15 (calc) (ref 6–22)
BUN: 21 mg/dL (ref 7–25)
CO2: 21 mmol/L (ref 20–32)
Calcium: 9.7 mg/dL (ref 8.6–10.4)
Chloride: 108 mmol/L (ref 98–110)
Creat: 1.38 mg/dL — ABNORMAL HIGH (ref 0.60–0.93)
GFR, Est African American: 43 mL/min/{1.73_m2} — ABNORMAL LOW (ref >=60)
GFR, Est Non African American: 37 mL/min/{1.73_m2} — ABNORMAL LOW (ref >=60)
Globulin: 2.4 g/dL (calc) (ref 1.9–3.7)
Glucose, Bld: 98 mg/dL (ref 65–99)
Potassium: 4.6 mmol/L (ref 3.5–5.3)
Sodium: 142 mmol/L (ref 135–146)
Total Bilirubin: 0.6 mg/dL (ref 0.2–1.2)
Total Protein: 6.1 g/dL (ref 6.1–8.1)

## 2017-12-06 LAB — LIPID PANEL
Cholesterol: 213 mg/dL — ABNORMAL HIGH (ref <200)
HDL: 61 mg/dL (ref >50)
LDL Cholesterol (Calc): 130 mg/dL (calc) — ABNORMAL HIGH
Non-HDL Cholesterol (Calc): 152 mg/dL (calc) — ABNORMAL HIGH (ref <130)
Total CHOL/HDL Ratio: 3.5 (calc) (ref <5.0)
Triglycerides: 112 mg/dL (ref <150)

## 2017-12-06 LAB — HEMOGLOBIN A1C
Hgb A1c MFr Bld: 5.6 % of total Hgb (ref <5.7)
Mean Plasma Glucose: 114 (calc)
eAG (mmol/L): 6.3 (calc)

## 2017-12-06 LAB — VITAMIN D 25 HYDROXY (VIT D DEFICIENCY, FRACTURES): Vit D, 25-Hydroxy: 35 ng/mL (ref 30–100)

## 2017-12-06 LAB — TSH: TSH: 3.22 mIU/L (ref 0.40–4.50)

## 2017-12-08 ENCOUNTER — Encounter: Payer: Self-pay | Admitting: Internal Medicine

## 2017-12-08 ENCOUNTER — Ambulatory Visit (INDEPENDENT_AMBULATORY_CARE_PROVIDER_SITE_OTHER): Payer: Medicare Other | Admitting: Internal Medicine

## 2017-12-08 VITALS — BP 200/120 | HR 64 | Ht 64.0 in | Wt 198.0 lb

## 2017-12-08 DIAGNOSIS — N183 Chronic kidney disease, stage 3 unspecified: Secondary | ICD-10-CM

## 2017-12-08 DIAGNOSIS — F329 Major depressive disorder, single episode, unspecified: Secondary | ICD-10-CM

## 2017-12-08 DIAGNOSIS — J309 Allergic rhinitis, unspecified: Secondary | ICD-10-CM | POA: Diagnosis not present

## 2017-12-08 DIAGNOSIS — R7989 Other specified abnormal findings of blood chemistry: Secondary | ICD-10-CM

## 2017-12-08 DIAGNOSIS — Z8679 Personal history of other diseases of the circulatory system: Secondary | ICD-10-CM | POA: Diagnosis not present

## 2017-12-08 DIAGNOSIS — Z86010 Personal history of colonic polyps: Secondary | ICD-10-CM | POA: Diagnosis not present

## 2017-12-08 DIAGNOSIS — E782 Mixed hyperlipidemia: Secondary | ICD-10-CM

## 2017-12-08 DIAGNOSIS — Z0000 Encounter for general adult medical examination without abnormal findings: Secondary | ICD-10-CM

## 2017-12-08 DIAGNOSIS — Z9889 Other specified postprocedural states: Secondary | ICD-10-CM

## 2017-12-08 DIAGNOSIS — Z23 Encounter for immunization: Secondary | ICD-10-CM

## 2017-12-08 DIAGNOSIS — F32A Depression, unspecified: Secondary | ICD-10-CM

## 2017-12-08 LAB — POCT URINALYSIS DIPSTICK
Appearance: NORMAL
Bilirubin, UA: NEGATIVE
Blood, UA: NEGATIVE
Glucose, UA: NEGATIVE
Ketones, UA: NEGATIVE
Leukocytes, UA: NEGATIVE
Nitrite, UA: NEGATIVE
Odor: NORMAL
Spec Grav, UA: 1.015 (ref 1.010–1.025)
Urobilinogen, UA: 0.2 E.U./dL
pH, UA: 6 (ref 5.0–8.0)

## 2017-12-08 NOTE — Progress Notes (Signed)
Subjective:    Patient ID: [name], female    DOB: 02-26-1941, 76 y.o.   MRN: [id number]  HPI 76 year old Female in today for health maintenance exam, Medicare wellness exam, evaluation of medical issues.  She has had runny nose this entire fall.  Has been taking Zyrtec without relief.  Recommended Flonase nasal spray and if not getting better we will evaluate for allergic rhinitis.  No discolored nasal drainage.  She has a history of hypertension, hyperlipidemia, osteoarthritis.  Status post bilateral hip replacement surgery by Dr. Maureen Ralphs.  Right hip was replaced January 2013 and left hip was replaced June 2013.  History of chronic kidney disease, right thyroid nodule.  Remote history of mitral valve prolapse.  History of adenomatous colon polyp.  In February 2012 she had a pancreaticoduodenectomy and pancreatic stent placed with Whipple procedure for benign tumor.  She had a serous microcystic adenoma of the pancreas.  She is now on Pancrease.  If she does not take it, she has diarrhea.  Not really motivated to diet and exercise.  History of osteoarthritis of her knees.  Last colonoscopy was 03/18/2005.  She had Pneumovax 23 in Mar 18, 2005.  Tetanus immunization 03-18-2002.  Influenza vaccine given today and she will return in a couple of weeks for Prevnar 13.  History of lumbar fusion L4-L5 in June 2011.  Had anterior cervical decompression C6-C7 with fusion in 03-19-07.  Had Bartholin cyst removed over 30 years ago.  In Mar 18, 2008 she was found to have an elevated serum creatinine of 1.4.  History of elevated uric acid.  Started on allopurinol in 18-Mar-2008.  History of pyelonephritis in 2008-03-18 treated as an outpatient.  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  She is a widow.  No children.  Does not smoke.  Occasionally will drink some wine.  Family history: Mother died in 5597 from complications of dementia with history of stroke.  Brother died from kidney cancer.   Father died in 1999-03-19 with congestive heart failure.  One brother in good health.  One sister with fibromyalgia.  Another sister in good health.  Patient had a syncopal episode in May 2016 when she was under stress.  She had a negative workup at that time.  She had Herpes zoster affecting her left trunk at the same time she had syncope.    Review of Systems  Constitutional: Positive for fatigue.  HENT: Positive for rhinorrhea.   Respiratory: Negative.   Cardiovascular: Negative.   Gastrointestinal: Negative.   Genitourinary: Negative.   Musculoskeletal: Positive for arthralgias.  Neurological: Negative.   Psychiatric/Behavioral: Negative.        Objective:   Physical Exam  Constitutional: She is oriented to person, place, and time. She appears well-developed and well-nourished. No distress.  HENT:  Head: Normocephalic and atraumatic.  Right Ear: External ear normal.  Left Ear: External ear normal.  Eyes: Conjunctivae and EOM are normal. Pupils are equal, round, and reactive to light. Right eye exhibits no discharge. Left eye exhibits no discharge.  Neck: Neck supple. No JVD present. No thyromegaly present.  Pulmonary/Chest:  Breasts normal female  Abdominal: Soft. Bowel sounds are normal. She exhibits no distension and no mass. There is no tenderness. There is no rebound and no guarding.  Genitourinary:  Genitourinary Comments: Bimanual normal.  Pap deferred.  Lymphadenopathy:    She has no cervical adenopathy.  Neurological: She is alert and oriented to person, place, and time. She has normal  reflexes. No cranial nerve deficit. Coordination normal.  Skin: Skin is dry. No rash noted. She is not diaphoretic.  Multiple seborrheic keratoses  Psychiatric: She has a normal mood and affect. Her behavior is normal. Judgment and thought content normal.  Vitals reviewed.         Assessment & Plan:  Chronic kidney disease-creatinine is actually improved from last year  Essential  hypertension-blood pressure is elevated today at 200/120.  I doubt she is taking her blood pressure medication.  Follow-up after the holidays  Hypothyroidism-TSH is within normal limits  Total cholesterol is 213.  Hyperlipidemia is diet controlled.  Obesity  History of musculoskeletal pain  Osteoarthritis of both knees  History of syncope likely vasovagal syncope  History of pancreaticoduodenectomy pancreatic stent with Whipple procedure 2012 for benign tumor  History of depression-continue Zoloft  Allergic rhinitis-add Flonase to Zyrtec  History of vitamin D deficiency-vitamin D level is normal  Plan: Flu vaccine given today and she will return in 2 weeks for Prevnar 13.  Subjective:   Patient presents for Medicare Annual/Subsequent preventive examination.  Review Past Medical/Family/Social: See above   Risk Factors: Current exercise habits: Sedentary Dietary issues discussed: Low-fat low carbohydrate  Cardiac risk factors: Hyperlipidemia, hypertension, history of stroke in mother and father with congestive heart failure  Depression Screen  (Note: if answer to either of the following is "Yes", a more complete depression screening is indicated)   Over the past two weeks, have you felt down, depressed or hopeless? No  Over the past two weeks, have you felt little interest or pleasure in doing things? No Have you lost interest or pleasure in daily life? No Do you often feel hopeless? No Do you cry easily over simple problems? No   Activities of Daily Living  In your present state of health, do you have any difficulty performing the following activities?:   Driving? No  Managing money? No  Feeding yourself? No  Getting from bed to chair? No  Climbing a flight of stairs? No  Preparing food and eating?: No  Bathing or showering? No  Getting dressed: No  Getting to the toilet? No  Using the toilet:No  Moving around from place to place: No  In the past year have  you fallen or had a near fall?:No  Are you sexually active? No  Do you have more than one partner? No   Hearing Difficulties: No  Do you often ask people to speak up or repeat themselves? No  Do you experience ringing or noises in your ears? No  Do you have difficulty understanding soft or whispered voices? No  Do you feel that you have a problem with memory? yes Do you often misplace items?  yes sometimes   Home Safety:  Do you have a smoke alarm at your residence? Yes Do you have grab bars in the bathroom?  Yes Do you have throw rugs in your house?  No   Cognitive Testing  Alert? Yes Normal Appearance?Yes  Oriented to person? Yes Place? Yes  Time? Yes  Recall of three objects? Yes  Can perform simple calculations? Yes  Displays appropriate judgment?Yes  Can read the correct time from a watch face?Yes   List the Names of Other Physician/Practitioners you currently use:  See referral list for the physicians patient is currently seeing.     Review of Systems: See above   Objective:     General appearance: Appears stated age and obese  Head: Normocephalic, without obvious  abnormality, atraumatic  Eyes: conj clear, EOMi PEERLA  Ears: normal TM's and external ear canals both ears  Nose: Nares normal. Septum midline. Mucosa normal. No drainage or sinus tenderness.  Throat: lips, mucosa, and tongue normal; teeth and gums normal  Neck: no adenopathy, no carotid bruit, no JVD, supple, symmetrical, trachea midline and thyroid not enlarged, symmetric, no tenderness/mass/nodules  No CVA tenderness.  Lungs: clear to auscultation bilaterally  Breasts: normal appearance, no masses or tenderness. Heart: regular rate and rhythm, S1, S2 normal, no murmur, click, rub or gallop  Abdomen: soft, non-tender; bowel sounds normal; no masses, no organomegaly  Musculoskeletal: ROM normal in all joints, no crepitus, no deformity, Normal muscle strengthen. Back  is symmetric, no  curvature. Skin: Skin very dry.  Multiple seborrheic keratoses. Lymph nodes: Cervical, supraclavicular, and axillary nodes normal.  Neurologic: CN 2 -12 Normal, Normal symmetric reflexes. Normal coordination and gait  Psych: Alert & Oriented x 3, Mood appear stable.    Assessment:    Annual wellness medicare exam   Plan:    During the course of the visit the patient was educated and counseled about appropriate screening and preventive services including:   Flu vaccine     Patient Instructions (the written plan) was given to the patient.  Medicare Attestation  I have personally reviewed:  The patient's medical and social history  Their use of alcohol, tobacco or illicit drugs  Their current medications and supplements  The patient's functional ability including ADLs,fall risks, home safety risks, cognitive, and hearing and visual impairment  Diet and physical activities  Evidence for depression or mood disorders  The patient's weight, height, BMI, and visual acuity have been recorded in the chart. I have made referrals, counseling, and provided education to the patient based on review of the above and I have provided the patient with a written personalized care plan for preventive services.

## 2017-12-22 ENCOUNTER — Other Ambulatory Visit: Payer: Self-pay

## 2017-12-22 MED ORDER — SERTRALINE HCL 50 MG PO TABS: 50.0000 mg | ORAL_TABLET | Freq: Every day | ORAL | 3 refills | Status: DC

## 2018-01-10 NOTE — Patient Instructions (Addendum)
It was a pleasure to see you today.  Continue same medications .  Return soon for repeat blood pressure check.  Be sure to take all of your medications.  Try to get some exercise.

## 2018-01-24 ENCOUNTER — Other Ambulatory Visit: Payer: Self-pay | Admitting: Internal Medicine

## 2018-02-05 ENCOUNTER — Ambulatory Visit: Payer: Medicare Other | Admitting: Internal Medicine

## 2018-02-10 ENCOUNTER — Encounter: Payer: Self-pay | Admitting: Internal Medicine

## 2018-02-10 ENCOUNTER — Ambulatory Visit (INDEPENDENT_AMBULATORY_CARE_PROVIDER_SITE_OTHER): Payer: Medicare Other | Admitting: Internal Medicine

## 2018-02-10 VITALS — BP 158/94 | HR 62 | Temp 98.2°F | Ht 64.0 in | Wt 196.0 lb

## 2018-02-10 DIAGNOSIS — I10 Essential (primary) hypertension: Secondary | ICD-10-CM | POA: Diagnosis not present

## 2018-02-10 MED ORDER — AMLODIPINE BESYLATE 10 MG PO TABS: 10.0000 mg | ORAL_TABLET | Freq: Every day | ORAL | 1 refills | Status: DC

## 2018-02-10 MED ORDER — ATENOLOL 50 MG PO TABS: 50.0000 mg | ORAL_TABLET | Freq: Every day | ORAL | 1 refills | Status: DC

## 2018-02-10 NOTE — Progress Notes (Signed)
   Subjective:    Patient ID: [name], female    DOB: [birth year], 77 y.o.   MRN: [id number]  HPI  A high school friend died of an aneurysm. Nepews's daughter ill with respiatory infection in hospital at [facility]. She is upset and anxious. Has run out of antihypertensive medication. Did not know this until I started seeing pt in the exam room. Refilled meds Advise return in 2 weeks    Review of Systems     Objective:   Physical Exam   Not examined     Assessment & Plan:  Anxiety Essential HTN Noncompliance with med Plan: Refilled Tenormin and amlodipine. RTC 2 weeks

## 2018-02-10 NOTE — Patient Instructions (Signed)
Amlodipine and Tenormin refilled. RTC in 2 weeks.

## 2018-02-24 ENCOUNTER — Encounter: Payer: Self-pay | Admitting: Internal Medicine

## 2018-02-24 ENCOUNTER — Ambulatory Visit (INDEPENDENT_AMBULATORY_CARE_PROVIDER_SITE_OTHER): Payer: Medicare Other | Admitting: Internal Medicine

## 2018-02-24 VITALS — BP 158/92 | HR 62 | Ht 64.0 in | Wt 195.0 lb

## 2018-02-24 DIAGNOSIS — I10 Essential (primary) hypertension: Secondary | ICD-10-CM

## 2018-02-24 DIAGNOSIS — M898X1 Other specified disorders of bone, shoulder: Secondary | ICD-10-CM | POA: Diagnosis not present

## 2018-02-24 MED ORDER — HYDROCHLOROTHIAZIDE 25 MG PO TABS: 25.0000 mg | ORAL_TABLET | Freq: Every day | ORAL | 3 refills | Status: DC

## 2018-02-24 NOTE — Patient Instructions (Signed)
Use heating pad for periscapular pain.  Add HCTZ to Tenormin and amlodipine and return in 4 weeks.

## 2018-02-24 NOTE — Progress Notes (Signed)
   Subjective:    Patient ID: [name], female    DOB: [birth year], 77 y.o.   MRN: [id number]  HPI 77 year old Female for follow up on HTN. Has had medication today.  Medications reviewed.  Says she is no longer taking thyroid replacement.  No longer taking pancreatic enzymes.    Review of Systems complaining of some scapular pain right side     Objective:   Physical Exam Chest clear to auscultation.  Cardiac exam regular rate and rhythm.  No lower extremity edema at the present time.  Has trigger point right medial parascapular area that has been bothering her.       Assessment & Plan:  Elevated blood pressure  Essential hypertension  Musculoskeletal pain in upper back near scapula  Plan: Add HCTZ to Tenormin and amlodipine and return in 4 weeks at which time she will need a basic metabolic panel, nurse visit and blood pressure check.  She will also need TSH at that time.

## 2018-03-12 ENCOUNTER — Other Ambulatory Visit: Payer: Self-pay | Admitting: Internal Medicine

## 2018-03-12 DIAGNOSIS — E785 Hyperlipidemia, unspecified: Secondary | ICD-10-CM

## 2018-03-12 DIAGNOSIS — I10 Essential (primary) hypertension: Secondary | ICD-10-CM

## 2018-03-26 ENCOUNTER — Ambulatory Visit (INDEPENDENT_AMBULATORY_CARE_PROVIDER_SITE_OTHER): Payer: Medicare Other | Admitting: Internal Medicine

## 2018-03-26 ENCOUNTER — Encounter: Payer: Self-pay | Admitting: Internal Medicine

## 2018-03-26 VITALS — BP 140/100 | HR 71 | Ht 64.0 in

## 2018-03-26 DIAGNOSIS — E785 Hyperlipidemia, unspecified: Secondary | ICD-10-CM

## 2018-03-26 DIAGNOSIS — F339 Major depressive disorder, recurrent, unspecified: Secondary | ICD-10-CM | POA: Diagnosis not present

## 2018-03-26 DIAGNOSIS — F329 Major depressive disorder, single episode, unspecified: Secondary | ICD-10-CM | POA: Diagnosis not present

## 2018-03-26 DIAGNOSIS — R5381 Other malaise: Secondary | ICD-10-CM | POA: Diagnosis not present

## 2018-03-26 DIAGNOSIS — R5383 Other fatigue: Secondary | ICD-10-CM

## 2018-03-26 DIAGNOSIS — N184 Chronic kidney disease, stage 4 (severe): Secondary | ICD-10-CM

## 2018-03-26 DIAGNOSIS — I10 Essential (primary) hypertension: Secondary | ICD-10-CM | POA: Diagnosis not present

## 2018-03-26 DIAGNOSIS — F32A Depression, unspecified: Secondary | ICD-10-CM

## 2018-03-26 MED ORDER — BUPROPION HCL ER (XL) 150 MG PO TB24: 150.0000 mg | ORAL_TABLET | Freq: Every day | ORAL | 2 refills | Status: DC

## 2018-03-26 MED ORDER — CARVEDILOL 12.5 MG PO TABS: 12.5000 mg | ORAL_TABLET | Freq: Two times a day (BID) | ORAL | 3 refills | Status: DC

## 2018-03-26 NOTE — Patient Instructions (Addendum)
Continue Zoloft.  Add Wellbutrin 150 mg XL daily.  Follow-up in 4 weeks.  Add Coreg 12.5 mg twice daily.  Encourage walking daily if possible.

## 2018-03-26 NOTE — Progress Notes (Signed)
   Subjective:    Patient ID: [name], female    DOB: 06-Mar-1941, 77 y.o.   MRN: [id number]  HPI 77 year old Female  for followup of HTN. Denies noncompliance with meds.  Says it driving a [place] relates her blood pressure elevated.  Initially on arrival her blood pressure was 140/100 and when I checked it was 160/90.  We are going to add Wellbutrin XL 150 mg daily because of depression and fatigue.  She is currently on Zoloft 50 mg daily.  I talked at length with her about her depression symptoms.  Says it manifested itself as fatigue.  Says she is not worried about money but has had some money problems.  Thinks things are going to work out.  Worries about her siblings.  She needs to get some more exercise.  Encouraged her to walk.  We are going to add Coreg 12.5 mg twice daily to HCTZ, amlodipine 10 mg daily and atenolol.    Review of Systems     Objective:   Physical Exam Skin warm and dry.  Nodes none.  Chest clear.  Cardiac exam regular rate and rhythm normal S1 and S2.  Extremities without pitting edema.  Affect seems unchanged       Assessment & Plan:  Essential hypertension-add Coreg 12.5 mg twice daily.  Depression add Wellbutrin XL 150 mg daily  History of chronic kidney disease-basic metabolic panel checked today.  Plan: Return in 4 weeks.  Encouraged her to walk some daily if possible

## 2018-03-27 LAB — BASIC METABOLIC PANEL
BUN/Creatinine Ratio: 19 (calc) (ref 6–22)
BUN: 31 mg/dL — ABNORMAL HIGH (ref 7–25)
CO2: 29 mmol/L (ref 20–32)
Calcium: 10.3 mg/dL (ref 8.6–10.4)
Chloride: 109 mmol/L (ref 98–110)
Creat: 1.6 mg/dL — ABNORMAL HIGH (ref 0.60–0.93)
Glucose, Bld: 90 mg/dL (ref 65–99)
Potassium: 4.8 mmol/L (ref 3.5–5.3)
Sodium: 145 mmol/L (ref 135–146)

## 2018-03-30 ENCOUNTER — Telehealth: Payer: Self-pay | Admitting: Internal Medicine

## 2018-03-30 NOTE — Telephone Encounter (Signed)
[name] Self [phone]  [name] called to say after speaking with pharmacist she is confused on what medicines she should be taken, and when to take them. She would like for someone to call her back and let her know.

## 2018-03-30 NOTE — Telephone Encounter (Signed)
Please call pt and go over meds. We added Coreg(Carvidilol) to her list last week.

## 2018-03-30 NOTE — Telephone Encounter (Signed)
Went over medications with patient, pt verbalized understanding.

## 2018-04-21 ENCOUNTER — Other Ambulatory Visit: Payer: Self-pay | Admitting: Internal Medicine

## 2018-04-28 ENCOUNTER — Ambulatory Visit (INDEPENDENT_AMBULATORY_CARE_PROVIDER_SITE_OTHER): Payer: Medicare Other | Admitting: Internal Medicine

## 2018-04-28 VITALS — BP 120/90 | HR 78 | Temp 97.9°F | Wt 194.0 lb

## 2018-04-28 DIAGNOSIS — R609 Edema, unspecified: Secondary | ICD-10-CM | POA: Diagnosis not present

## 2018-04-28 DIAGNOSIS — I10 Essential (primary) hypertension: Secondary | ICD-10-CM | POA: Diagnosis not present

## 2018-04-28 MED ORDER — AMLODIPINE BESYLATE 5 MG PO TABS: 5.0000 mg | ORAL_TABLET | Freq: Every day | ORAL | 0 refills | Status: DC

## 2018-04-28 NOTE — Progress Notes (Signed)
   Subjective:    Patient ID: [name], female    DOB: 1941/10/22, 77 y.o.   MRN: [id number]  HPI For follow up on depression and HTN. At home, BP has been 241-991 systolically and 44-45 diastolically.    Review of Systems see above-feeling less depressed and more energetic     Objective:   Physical Exam  Blood pressure 120/90.      Assessment & Plan:  Depression- improved with Wellbutrin- continue this inderfinitely and continue Zoloft  Essential HTN  Plan: She will call me with blood pressure readings from home in the next couple of weeks.  Follow-up with CPE and Medicare wellness exam in December

## 2018-05-12 ENCOUNTER — Telehealth: Payer: Self-pay | Admitting: Internal Medicine

## 2018-05-12 NOTE — Telephone Encounter (Signed)
Have her check it twice a week from now on. I am pleased with these results.

## 2018-05-12 NOTE — Telephone Encounter (Signed)
[name] and gave ask her to take BP twice a week from now on and that Dr Renold Genta is pleased with results. Patient verbalized understanding.

## 2018-05-12 NOTE — Telephone Encounter (Signed)
[name] Self [phone]  Jayni called and said she was to call in 2 weeks and give her BP readings.  04/30/18 am -- 123/79 05/01/18 pm -- 102/69 05/02/18 am -- 112/60 05/03/18 am -- 102/61 05/06/18 am -- 123/77 05/07/18 am -- 124/66 05/08/18 am -- 120/64 05/10/18 am -- 127/69 05/11/18 am -- 103/50 05/12/18 am -- 123/77  She will continue to log blood pressures until you tell her other wise.

## 2018-05-14 ENCOUNTER — Encounter: Payer: Self-pay | Admitting: Internal Medicine

## 2018-05-14 NOTE — Patient Instructions (Signed)
It was a pleasure to see you today.  Call with blood pressure readings in a couple of weeks.  Continue to monitor blood pressure at home.  Follow-up in December

## 2018-06-19 ENCOUNTER — Other Ambulatory Visit: Payer: Self-pay | Admitting: Internal Medicine

## 2018-07-26 ENCOUNTER — Other Ambulatory Visit: Payer: Self-pay | Admitting: Internal Medicine

## 2018-09-09 DIAGNOSIS — Z23 Encounter for immunization: Secondary | ICD-10-CM | POA: Diagnosis not present

## 2018-10-20 ENCOUNTER — Other Ambulatory Visit: Payer: Self-pay | Admitting: Internal Medicine

## 2018-10-23 ENCOUNTER — Other Ambulatory Visit: Payer: Self-pay | Admitting: Internal Medicine

## 2018-12-07 ENCOUNTER — Other Ambulatory Visit: Payer: Medicare Other | Admitting: Internal Medicine

## 2018-12-07 DIAGNOSIS — E782 Mixed hyperlipidemia: Secondary | ICD-10-CM | POA: Diagnosis not present

## 2018-12-07 DIAGNOSIS — I10 Essential (primary) hypertension: Secondary | ICD-10-CM

## 2018-12-07 DIAGNOSIS — F329 Major depressive disorder, single episode, unspecified: Secondary | ICD-10-CM

## 2018-12-07 DIAGNOSIS — Z0000 Encounter for general adult medical examination without abnormal findings: Secondary | ICD-10-CM | POA: Diagnosis not present

## 2018-12-07 DIAGNOSIS — F32A Depression, unspecified: Secondary | ICD-10-CM

## 2018-12-07 DIAGNOSIS — N183 Chronic kidney disease, stage 3 unspecified: Secondary | ICD-10-CM

## 2018-12-07 DIAGNOSIS — R7302 Impaired glucose tolerance (oral): Secondary | ICD-10-CM | POA: Diagnosis not present

## 2018-12-08 LAB — LIPID PANEL
Cholesterol: 187 mg/dL (ref <200)
HDL: 61 mg/dL (ref >50)
LDL Cholesterol (Calc): 104 mg/dL (calc) — ABNORMAL HIGH
Non-HDL Cholesterol (Calc): 126 mg/dL (calc) (ref <130)
Total CHOL/HDL Ratio: 3.1 (calc) (ref <5.0)
Triglycerides: 120 mg/dL (ref <150)

## 2018-12-08 LAB — CBC WITH DIFFERENTIAL/PLATELET
Absolute Monocytes: 677 cells/uL (ref 200–950)
Basophils Absolute: 19 cells/uL (ref 0–200)
Basophils Relative: 0.2 %
Eosinophils Absolute: 47 cells/uL (ref 15–500)
Eosinophils Relative: 0.5 %
HCT: 40.5 % (ref 35.0–45.0)
Hemoglobin: 14.4 g/dL (ref 11.7–15.5)
Lymphs Abs: 1861 cells/uL (ref 850–3900)
MCH: 31 pg (ref 27.0–33.0)
MCHC: 35.6 g/dL (ref 32.0–36.0)
MCV: 87.3 fL (ref 80.0–100.0)
MPV: 11.7 fL (ref 7.5–12.5)
Monocytes Relative: 7.2 %
Neutro Abs: 6796 cells/uL (ref 1500–7800)
Neutrophils Relative %: 72.3 %
Platelets: 166 10*3/uL (ref 140–400)
RBC: 4.64 10*6/uL (ref 3.80–5.10)
RDW: 12.3 % (ref 11.0–15.0)
Total Lymphocyte: 19.8 %
WBC: 9.4 10*3/uL (ref 3.8–10.8)

## 2018-12-08 LAB — COMPLETE METABOLIC PANEL WITH GFR
AG Ratio: 2 (calc) (ref 1.0–2.5)
ALT: 16 U/L (ref 6–29)
AST: 13 U/L (ref 10–35)
Albumin: 4.1 g/dL (ref 3.6–5.1)
Alkaline phosphatase (APISO): 69 U/L (ref 33–130)
BUN/Creatinine Ratio: 16 (calc) (ref 6–22)
BUN: 26 mg/dL — ABNORMAL HIGH (ref 7–25)
CO2: 26 mmol/L (ref 20–32)
Calcium: 10 mg/dL (ref 8.6–10.4)
Chloride: 106 mmol/L (ref 98–110)
Creat: 1.59 mg/dL — ABNORMAL HIGH (ref 0.60–0.93)
GFR, Est African American: 36 mL/min/{1.73_m2} — ABNORMAL LOW (ref >=60)
GFR, Est Non African American: 31 mL/min/{1.73_m2} — ABNORMAL LOW (ref >=60)
Globulin: 2.1 g/dL (calc) (ref 1.9–3.7)
Glucose, Bld: 97 mg/dL (ref 65–99)
Potassium: 4 mmol/L (ref 3.5–5.3)
Sodium: 141 mmol/L (ref 135–146)
Total Bilirubin: 1 mg/dL (ref 0.2–1.2)
Total Protein: 6.2 g/dL (ref 6.1–8.1)

## 2018-12-08 LAB — HEMOGLOBIN A1C
Hgb A1c MFr Bld: 5.4 % of total Hgb (ref <5.7)
Mean Plasma Glucose: 108 (calc)
eAG (mmol/L): 6 (calc)

## 2018-12-08 LAB — TSH: TSH: 1.81 mIU/L (ref 0.40–4.50)

## 2018-12-10 ENCOUNTER — Ambulatory Visit (INDEPENDENT_AMBULATORY_CARE_PROVIDER_SITE_OTHER): Payer: Medicare Other | Admitting: Internal Medicine

## 2018-12-10 ENCOUNTER — Encounter: Payer: Self-pay | Admitting: Internal Medicine

## 2018-12-10 ENCOUNTER — Telehealth: Payer: Self-pay | Admitting: Internal Medicine

## 2018-12-10 VITALS — BP 140/90 | HR 85 | Temp 98.2°F | Ht 64.0 in | Wt 191.0 lb

## 2018-12-10 DIAGNOSIS — F324 Major depressive disorder, single episode, in partial remission: Secondary | ICD-10-CM | POA: Diagnosis not present

## 2018-12-10 DIAGNOSIS — Z9889 Other specified postprocedural states: Secondary | ICD-10-CM | POA: Diagnosis not present

## 2018-12-10 DIAGNOSIS — N184 Chronic kidney disease, stage 4 (severe): Secondary | ICD-10-CM | POA: Diagnosis not present

## 2018-12-10 DIAGNOSIS — Z86010 Personal history of colonic polyps: Secondary | ICD-10-CM

## 2018-12-10 DIAGNOSIS — Z860101 Personal history of adenomatous and serrated colon polyps: Secondary | ICD-10-CM

## 2018-12-10 DIAGNOSIS — Z0000 Encounter for general adult medical examination without abnormal findings: Secondary | ICD-10-CM

## 2018-12-10 DIAGNOSIS — R8290 Unspecified abnormal findings in urine: Secondary | ICD-10-CM | POA: Diagnosis not present

## 2018-12-10 DIAGNOSIS — Z8679 Personal history of other diseases of the circulatory system: Secondary | ICD-10-CM

## 2018-12-10 DIAGNOSIS — R5381 Other malaise: Secondary | ICD-10-CM

## 2018-12-10 DIAGNOSIS — I10 Essential (primary) hypertension: Secondary | ICD-10-CM | POA: Diagnosis not present

## 2018-12-10 LAB — POCT URINALYSIS DIPSTICK
Bilirubin, UA: NEGATIVE
Blood, UA: NEGATIVE
Glucose, UA: NEGATIVE
Ketones, UA: NEGATIVE
Nitrite, UA: NEGATIVE
Protein, UA: POSITIVE — AB
Spec Grav, UA: 1.02 (ref 1.010–1.025)
Urobilinogen, UA: 0.2 E.U./dL
pH, UA: 6 (ref 5.0–8.0)

## 2018-12-10 NOTE — Progress Notes (Signed)
Subjective:    Patient ID: [name], female    DOB: Nov 22, 1941, 77 y.o.   MRN: [id number]  HPI 77 year old White Female in today for health maintenance exam, Medicare wellness and evaluation of medical issues including chronic kidney disease and hypertension.  History of osteoarthritis.  History of depression treated with Wellbutrin and Zoloft.  Continues to reside alone.  Continues to drive.  History of allergic rhinitis symptoms.  History of mixed hyperlipidemia but recent lipid panels have been normal.  Not on lipid-lowering medication.  Has had some difficult to control blood pressure.  Is on amlodipine Tenormin and Coreg along with HCTZ.    Status post bilateral hip replacement surgery by Dr. Wynelle Link.  Right hip was replaced January 2013 and left hip was replaced June 2013.  History of chronic kidney disease, right thyroid nodule, remote history of mitral valve prolapse, history of adenomatous colon polyp.  In February 2012 she had a pancreaticoduodenectomy and a pancreatic stent placed with a Whipple procedure for benign tumor.  She had a serous microcystic adenoma of the pancreas.  She is now on Pancrease.  If she does not take it she has diarrhea.  Says she gets stiff and sore and she relates that to her hip arthroplasties.  Not really motivated to diet and exercise.  She has osteoarthritis of her knees and is overweight.  Last colonoscopy was in 2005/03/28.  She had lumbar fusion L4-L5 in June 2011.  She had anterior cervical decompression C6-C7 with fusion in 03-29-07.  And Bartholin cyst removed over 30 years ago.  In 03/28/2008 she was found to have an elevated serum creatinine of 1.4.  History of elevated uric acid and was started on allopurinol in 03-28-08.  History of pyelonephritis in March 28, 2008 treated as an outpatient.  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  No children.  Does not smoke.  Occasionally will drink some wine.  Family  history: Mother died in 8841 from complications of dementia with history of stroke.  Brother died from kidney cancer.  Father died in 1999/03/29 with congestive heart failure.  One brother in good health.  One sister with fibromyalgia.  Another sister in good health.  Patient had a syncopal episode May 2016 when she was under stress.  She had a negative work-up at that time.  She had herpes zoster affecting her left trunk at the same time she had syncope.          Review of Systems  Constitutional: Positive for fatigue.  Respiratory: Negative.   Cardiovascular: Negative.   Skin:       Multiple keratoses on skin  Neurological: Negative.   Psychiatric/Behavioral:       She is on 2 antidepressants but says she gets depressed sometimes.       Objective:   Physical Exam Constitutional:      General: She is not in acute distress.    Appearance: Normal appearance. She is obese.  HENT:     Head: Normocephalic and atraumatic.     Right Ear: Tympanic membrane and external ear normal.     Left Ear: Tympanic membrane and external ear normal.     Nose: No congestion or rhinorrhea.     Mouth/Throat:     Mouth: Mucous membranes are moist.     Pharynx: Oropharynx is clear. No oropharyngeal exudate.  Eyes:     General: No scleral icterus.  Left eye: No discharge.     Extraocular Movements: Extraocular movements intact.     Conjunctiva/sclera: Conjunctivae normal.     Pupils: Pupils are equal, round, and reactive to light.  Neck:     Musculoskeletal: No neck rigidity.     Vascular: No carotid bruit.  Cardiovascular:     Rate and Rhythm: Normal rate and regular rhythm.     Heart sounds: Normal heart sounds. No murmur.  Pulmonary:     Effort: Pulmonary effort is normal.     Breath sounds: Normal breath sounds. No wheezing or rales.  Abdominal:     General: There is no distension.     Palpations: Abdomen is soft.     Tenderness: There is no guarding or rebound.  Genitourinary:     Comments: Pap deferred due to age.  Bimanual normal. Musculoskeletal:        General: No swelling.  Skin:    General: Skin is warm and dry.     Comments: Multiple keratoses on trunk.  Has dark nevus versus keratosis left upper arm.  Neurological:     General: No focal deficit present.     Mental Status: She is alert and oriented to person, place, and time.     Cranial Nerves: No cranial nerve deficit.     Gait: Gait normal.  Psychiatric:        Mood and Affect: Mood normal.        Behavior: Behavior normal.        Thought Content: Thought content normal.        Judgment: Judgment normal.           Assessment & Plan:  Multiple seborrheic keratoses.  Dark nevus left arm versus keratosis.  Refer to dermatologist.  Was given phone number to call for appointment.  Essential hypertension-blood pressure stable at 140/90.  Has labile hypertension.  Obesity-not really able to exercise due to bilateral osteoarthritis of her knees.  Status post bilateral hip replacements as well.  History of depression-maintained on 2 antidepressants  Remote history of mitral valve prolapse-no click appreciated  Chronic kidney disease.  Creatinine stable at 1.59  Hyperlipidemia-lipid panel essentially within normal limits with slightly elevated LDL of 104  Hypothyroidism-TSH is within normal limits  Obesity-encourage diet.  Unable to exercise very much due to bilateral knee osteoarthritis  Musculoskeletal pain  History of pancreaticoduodenectomy pancreatic stent and Whipple procedure 2012 for benign tumor  Allergic rhinitis treated with Flonase and Zyrtec  Plan: Records indicate she needs Prevnar 13 and flu vaccine.  Subjective:   Patient presents for Medicare Annual/Subsequent preventive examination.  Review Past Medical/Family/Social: See above   Risk Factors  Current exercise habits: Sedentary Dietary issues discussed: Low-fat low carbohydrate  Cardiac risk factors: Hypertension  and hyperlipidemia  Depression Screen  (Note: if answer to either of the following is "Yes", a more complete depression screening is indicated)   Over the past two weeks, have you felt down, depressed or hopeless? No  Over the past two weeks, have you felt little interest or pleasure in doing things? No Have you lost interest or pleasure in daily life? No Do you often feel hopeless? No Do you cry easily over simple problems? No   Activities of Daily Living  In your present state of health, do you have any difficulty performing the following activities?:   Driving? No  Managing money? No  Feeding yourself? No  Getting from bed to chair? No  Climbing a flight of  stairs? No  Preparing food and eating?: No  Bathing or showering? No  Getting dressed: No  Getting to the toilet? No  Using the toilet:No  Moving around from place to place: No  In the past year have you fallen or had a near fall?:  Near fall but no fall Are you sexually active? No  Do you have more than one partner? No   Hearing Difficulties: No  Do you often ask people to speak up or repeat themselves? No  Do you experience ringing or noises in your ears?  Yes Do you have difficulty understanding soft or whispered voices? No  Do you feel that you have a problem with memory? No Do you often misplace items?  Yes   Home Safety:  Do you have a smoke alarm at your residence? Yes Do you have grab bars in the bathroom?  Yes Do you have throw rugs in your house?  No   Cognitive Testing  Alert? Yes Normal Appearance?Yes  Oriented to person? Yes Place? Yes  Time? Yes  Recall of three objects? Yes  Can perform simple calculations? Yes  Displays appropriate judgment?Yes  Can read the correct time from a watch face?Yes   List the Names of Other Physician/Practitioners you currently use:  See referral list for the physicians patient is currently seeing.  No specialists currently   Review of Systems: see  above   Objective:     General appearance: Appears stated age and  obese  Head: Normocephalic, without obvious abnormality, atraumatic  Eyes: conj clear, EOMi PEERLA  Ears: normal TM's and external ear canals both ears  Nose: Nares normal. Septum midline. Mucosa normal. No drainage or sinus tenderness.  Throat: lips, mucosa, and tongue normal; teeth and gums normal  Neck: no adenopathy, no carotid bruit, no JVD, supple, symmetrical, trachea midline and thyroid not enlarged, symmetric, no tenderness/mass/nodules  No CVA tenderness.  Lungs: clear to auscultation bilaterally  Breasts: normal appearance, no masses or tenderness Heart: regular rate and rhythm, S1, S2 normal, no murmur, click, rub or gallop  Abdomen: soft, non-tender; bowel sounds normal; no masses, no organomegaly  Musculoskeletal: ROM normal in all joints, no crepitus, no deformity, Normal muscle strengthen. Back  is symmetric, no curvature. Skin: Skin color, texture, turgor normal. No rashes or lesions  Lymph nodes: Cervical, supraclavicular, and axillary nodes normal.  Neurologic: CN 2 -12 Normal, Normal symmetric reflexes. Normal coordination and gait  Psych: Alert & Oriented x 3, Mood appear stable.    Assessment:    Annual wellness medicare exam   Plan:    During the course of the visit the patient was educated and counseled about appropriate screening and preventive services including:   Prevnar 13  Flu vaccine  Shingles vaccine discussed but she has had shingles in the past     Patient Instructions (the written plan) was given to the patient.  Medicare Attestation  I have personally reviewed:  The patient's medical and social history  Their use of alcohol, tobacco or illicit drugs  Their current medications and supplements  The patient's functional ability including ADLs,fall risks, home safety risks, cognitive, and hearing and visual impairment  Diet and physical activities  Evidence for  depression or mood disorders  The patient's weight, height, BMI, and visual acuity have been recorded in the chart. I have made referrals, counseling, and provided education to the patient based on review of the above and I have provided the patient with a written personalized care  plan for preventive services.

## 2018-12-10 NOTE — Telephone Encounter (Signed)
Pt had appt today for CPE and Medicare wellness visit. She came previously for CPE labs but did not keep appt today. Left voice mail message asking her to call us back

## 2018-12-11 ENCOUNTER — Telehealth: Payer: Self-pay | Admitting: Internal Medicine

## 2018-12-11 ENCOUNTER — Encounter: Payer: Self-pay | Admitting: Internal Medicine

## 2018-12-11 NOTE — Telephone Encounter (Signed)
Reviewed results in detail by phone. She did not answer. Message left. Needs Prevnar 13 and clarify status of flu vaccine. Follow up in 6 months. Will need lipid, AIC and C-met. With OV then. Yesterday given phone number for dermatologist to have skin checked.

## 2018-12-12 ENCOUNTER — Telehealth: Payer: Self-pay | Admitting: Internal Medicine

## 2018-12-12 ENCOUNTER — Encounter: Payer: Self-pay | Admitting: Internal Medicine

## 2018-12-12 LAB — URINE CULTURE
MICRO NUMBER:: 91541330
SPECIMEN QUALITY:: ADEQUATE

## 2018-12-12 MED ORDER — LEVOFLOXACIN 250 MG PO TABS: 250.0000 mg | ORAL_TABLET | Freq: Every day | ORAL | 0 refills | Status: DC

## 2018-12-12 NOTE — Patient Instructions (Signed)
Urine culture pending.  Continue same medications.  Follow-up in 6 months.  Contacted regarding need for Prevnar 13 the day after her physical exam.

## 2018-12-12 NOTE — Telephone Encounter (Signed)
Pt has UTI. Message left for her to pick up Levaquin 250 mg daily x 10 days. Needs follow up around January 10. Can get Prevnar 13 at that time.

## 2019-01-02 ENCOUNTER — Other Ambulatory Visit: Payer: Self-pay | Admitting: Internal Medicine

## 2019-01-14 ENCOUNTER — Other Ambulatory Visit: Payer: Self-pay | Admitting: Internal Medicine

## 2019-02-13 ENCOUNTER — Other Ambulatory Visit: Payer: Self-pay | Admitting: Internal Medicine

## 2019-02-15 DIAGNOSIS — D225 Melanocytic nevi of trunk: Secondary | ICD-10-CM | POA: Diagnosis not present

## 2019-02-15 DIAGNOSIS — L821 Other seborrheic keratosis: Secondary | ICD-10-CM | POA: Diagnosis not present

## 2019-02-15 DIAGNOSIS — L820 Inflamed seborrheic keratosis: Secondary | ICD-10-CM | POA: Diagnosis not present

## 2019-04-10 ENCOUNTER — Other Ambulatory Visit: Payer: Self-pay | Admitting: Internal Medicine

## 2019-06-03 ENCOUNTER — Telehealth: Payer: Self-pay | Admitting: Internal Medicine

## 2019-06-03 NOTE — Telephone Encounter (Signed)
Pt needs December appt before we can refil

## 2019-06-03 NOTE — Telephone Encounter (Signed)
Received Fax RX request from  [facility]  Medication - carvedilol (COREG) 12.5 MG tablet   Last Refill -   Last OV - 12-10-18  Last CPE - 12-10-18

## 2019-06-16 MED ORDER — CARVEDILOL 12.5 MG PO TABS: 12.5000 mg | ORAL_TABLET | Freq: Two times a day (BID) | ORAL | 1 refills | Status: DC

## 2019-06-16 NOTE — Telephone Encounter (Signed)
LVM to CB and scheduled CPE for December

## 2019-06-16 NOTE — Telephone Encounter (Signed)
Scheduled CPE for December

## 2019-06-16 NOTE — Addendum Note (Signed)
Addended by: Mady Haagensen on: 06/16/2019 12:57 PM   Modules accepted: Orders

## 2019-07-02 ENCOUNTER — Other Ambulatory Visit: Payer: Self-pay | Admitting: Internal Medicine

## 2019-07-02 NOTE — Telephone Encounter (Signed)
Needs CPE after Dec 26. Please book before refilling

## 2019-07-03 ENCOUNTER — Telehealth: Payer: Self-pay | Admitting: Internal Medicine

## 2019-07-03 ENCOUNTER — Encounter: Payer: Self-pay | Admitting: Internal Medicine

## 2019-07-03 NOTE — Telephone Encounter (Signed)
Amlodipine refilled and appt for PE made for December. Sister in [place] had Covid 19 and was hospitalized. Pt has no known exposure and is asymptomatic. Do not think testing is indicated for her at this time.

## 2019-09-13 ENCOUNTER — Other Ambulatory Visit: Payer: Self-pay

## 2019-09-13 ENCOUNTER — Encounter: Payer: Self-pay | Admitting: Internal Medicine

## 2019-09-13 ENCOUNTER — Ambulatory Visit (INDEPENDENT_AMBULATORY_CARE_PROVIDER_SITE_OTHER): Payer: Medicare Other | Admitting: Internal Medicine

## 2019-09-13 DIAGNOSIS — Z23 Encounter for immunization: Secondary | ICD-10-CM

## 2019-09-13 NOTE — Progress Notes (Signed)
Flu vaccine given by CMA 

## 2019-09-13 NOTE — Patient Instructions (Signed)
Patient received a flu vaccine IM L deltoid, AV, CMA  

## 2019-10-01 ENCOUNTER — Other Ambulatory Visit: Payer: Self-pay | Admitting: Internal Medicine

## 2019-11-10 ENCOUNTER — Other Ambulatory Visit: Payer: Self-pay

## 2019-12-06 ENCOUNTER — Other Ambulatory Visit: Payer: Medicare Other | Admitting: Internal Medicine

## 2019-12-13 ENCOUNTER — Ambulatory Visit (INDEPENDENT_AMBULATORY_CARE_PROVIDER_SITE_OTHER): Payer: Medicare Other | Admitting: Internal Medicine

## 2019-12-13 ENCOUNTER — Encounter: Payer: Self-pay | Admitting: Internal Medicine

## 2019-12-13 ENCOUNTER — Other Ambulatory Visit: Payer: Self-pay

## 2019-12-13 VITALS — BP 130/80 | HR 82 | Temp 97.9°F | Ht 64.0 in | Wt 191.0 lb

## 2019-12-13 DIAGNOSIS — Z8659 Personal history of other mental and behavioral disorders: Secondary | ICD-10-CM

## 2019-12-13 DIAGNOSIS — Z96641 Presence of right artificial hip joint: Secondary | ICD-10-CM

## 2019-12-13 DIAGNOSIS — Z0000 Encounter for general adult medical examination without abnormal findings: Secondary | ICD-10-CM | POA: Diagnosis not present

## 2019-12-13 DIAGNOSIS — E039 Hypothyroidism, unspecified: Secondary | ICD-10-CM

## 2019-12-13 DIAGNOSIS — Z96642 Presence of left artificial hip joint: Secondary | ICD-10-CM

## 2019-12-13 DIAGNOSIS — R8290 Unspecified abnormal findings in urine: Secondary | ICD-10-CM | POA: Diagnosis not present

## 2019-12-13 DIAGNOSIS — I10 Essential (primary) hypertension: Secondary | ICD-10-CM | POA: Diagnosis not present

## 2019-12-13 DIAGNOSIS — E782 Mixed hyperlipidemia: Secondary | ICD-10-CM

## 2019-12-13 DIAGNOSIS — M170 Bilateral primary osteoarthritis of knee: Secondary | ICD-10-CM

## 2019-12-13 DIAGNOSIS — Z86010 Personal history of colonic polyps: Secondary | ICD-10-CM

## 2019-12-13 DIAGNOSIS — N184 Chronic kidney disease, stage 4 (severe): Secondary | ICD-10-CM | POA: Diagnosis not present

## 2019-12-13 DIAGNOSIS — R5381 Other malaise: Secondary | ICD-10-CM

## 2019-12-13 DIAGNOSIS — J301 Allergic rhinitis due to pollen: Secondary | ICD-10-CM

## 2019-12-13 DIAGNOSIS — L821 Other seborrheic keratosis: Secondary | ICD-10-CM

## 2019-12-13 DIAGNOSIS — Z9889 Other specified postprocedural states: Secondary | ICD-10-CM

## 2019-12-13 LAB — POCT URINALYSIS DIPSTICK
Appearance: NEGATIVE
Bilirubin, UA: NEGATIVE
Blood, UA: NEGATIVE
Glucose, UA: NEGATIVE
Ketones, UA: NEGATIVE
Nitrite, UA: NEGATIVE
Odor: NEGATIVE
Protein, UA: POSITIVE — AB
Spec Grav, UA: 1.01 (ref 1.010–1.025)
Urobilinogen, UA: 0.2 E.U./dL
pH, UA: 6.5 (ref 5.0–8.0)

## 2019-12-13 NOTE — Patient Instructions (Addendum)
It was a pleasure to see you today.  Please return tomorrow for fasting lab work.  For now continue current medications.  Addendum December 30,2020 Begin Levothyroxine 50 mcg daily and follow up in 6 weeks. Take on empty stomach with no other meds.

## 2019-12-13 NOTE — Progress Notes (Signed)
Subjective:    Patient ID: [name], female    DOB: 1941/02/10, 78 y.o.   MRN: [id number]  HPI 78 year old Female for health maintenance examination and evaluation of medical issues. Has good neighbors. Only going to grocery store. One of her neighbors died recently due to Covid.  She has a longstanding history of depression treated with Wellbutrin and Zoloft.  Continues to reside alone and drives but not driving a lot.  History of allergic rhinitis.  History of mixed hyperlipidemia.  History of hypertension.  Status post bilateral hip replacement surgery by Dr. Wynelle Link.  Right hip surgery January 2013 and left hip surgery was June 2013.  History of chronic kidney disease, right thyroid nodule, remote history of mitral valve prolapse, history of adenomatous colon polyp.  In February 2012 she had a pancreaticoduodenectomy and a pancreatic stent placed with a Whipple procedure for benign tumor.  History of serous microcystic adenoma of the pancreas.  She is now treated with Pancrease.  If she does not take it she has diarrhea.  Past musculoskeletal pain at times.  Not motivated to diet exercise.  Has osteoarthritis of her knees and is overweight.  Last colonoscopy was in 2006.  She had lumbar fusion L4-L5 in June 2011.  She had anterior cervical decompression C6-C7 with fusion in 2008.  Had Bartholin cyst removed over 30 years ago.  In 2009 she was found to have an elevated serum creatinine of 1.4.  She has a history of elevated uric acid and was started on allopurinol in 2009.  History of pyelonephritis 2009 treated as an outpatient.  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  No children.  Does not smoke.  Occasionally will drink some wine.  Discussion today regarding living alone and whether she needs more assistance.  She feels she does not need more assistance at the present time but has not really made any preparations for  moving to a retirement facility nor does she know who would look after her her health began to fail.  Patient had a syncopal episode May 2016 when she was under stress.  She had a negative work-up at that time.  She had Herpes zoster affecting her left trunk at the same time she had syncope.  Family history: Mother died of complications of dementia with history of stroke.  Brother died from kidney cancer.  Another brother with GI cancer who lives in [place] not getting out much- goes to grocery store only. Can remember how to cook just fine. Forgot to fast today Only exercise is going to mailbox and dumpster  Discussion about where she would go if she became disabled. Says has great niece in [place] and niece in [place] is Software engineer and Barbette Or is Scientist, research (life sciences). Knows it is December 2020. Can do serial 7s.    Objective:   Physical Exam   BP 130/80, pulse 82, afebrile, Pulse ox 97% Weight 191 pounds BMI 32.79 skin: Has multiple seborrheic keratoses on skin.  Skin is dry.  Nodes none.  Neck is supple without JVD thyromegaly or carotid bruits.  TMs are clear.  Chest clear to auscultation without rales or wheezing.  Breast without masses.  Cardiac exam regular rate and rhythm normal S1 and S2 without murmurs abdomen obese soft nondistended without hepatosplenomegaly masses or tenderness.  Pap deferred due to age.  Bimanual exam normal.  No lower extremity pitting edema.  Neuro no gross focal deficits on brief neurological exam.  She is able to do serial sevens, knows president, alert oriented x3      Assessment & Plan:  History of anxiety and depression.  Post COVID-19 more anxious.  Not getting out much.  Longstanding history of anxiety and depression treated with 2 antidepressants  Living alone-discussion regarding living alone and then where she would go should she have a primary care physician if needed for attention.  Has not signed up to move  into any sort of retirement facility.  Obesity-history of osteoarthritis of both knees and is not really able to diet and exercise.  Basically walks outside only to go to dumpster to throw trash away and to pick up her mail from [place].  History of chronic kidney disease-check BUN and creatinine  Hyperlipidemia-check lipid panel  Hypothyroidism-check TSH- took levothyroxine in 2016  Remote history of mitral valve prolapse-asymptomatic  History of pancreatic duodenectomy, pancreatic stent and Whipple procedure 2012 for benign tumor  History of allergic rhinitis treated with Flonase and Zyrtec  Multiple seborrheic keratoses and she has 1 inflamed area on his back that she has scratched recently with no evidence of secondary infection  Plan: She had flu vaccine September 2020.  Agree with social distancing until she is able to receive COVID-19 vaccine.  Initially said she could not remember how to get in the office.  Says she had forgotten she had an appointment.  I was able to get on the phone and convince her to come today.  She will need to have fasting lab work drawn tomorrow and agrees to drive back here for that.  Was given directions on the phone she found the office just fine.  Is to have fasting lab work tomorrow.  Return in 1 year or as needed pending lab work results.  Subjective:   Patient presents for Medicare Annual/Subsequent preventive examination.  Review Past Medical/Family/Social:see above   Risk Factors  Current exercise habits: sedentary Dietary issues discussed: low fat low carb  Cardiac risk factors:hyperlipidemia  Depression Screen  (Note: if answer to either of the following is "Yes", a more complete depression screening is indicated)   Over the past two weeks, have you felt down, depressed or hopeless? No  Over the past two weeks, have you felt little interest or pleasure in doing things? No Have you lost interest or pleasure in daily life? No Do you  often feel hopeless? No Do you cry easily over simple problems? No   Activities of Daily Living  In your present state of health, do you have any difficulty performing the following activities?:   Driving? No  Managing money? No  Feeding yourself? No  Getting from bed to chair? No  Climbing a flight of stairs? No  Preparing food and eating?: No  Bathing or showering? No  Getting dressed: No  Getting to the toilet? No  Using the toilet:No  Moving around from place to place: No  In the past year have you fallen or had a near fall?:No  Are you sexually active? No  Do you have more than one partner? No   Hearing Difficulties: No  Do you often ask people to speak up or repeat themselves? No  Do you experience ringing or noises in your ears? No  Do you have difficulty understanding soft or whispered voices? No  Do you feel that you have a problem with memory? No Do you often  misplace items? No    Home Safety:  Do you have a smoke alarm at your residence? Yes Do you have grab bars in the bathroom? Do you have throw rugs in your house?   Cognitive Testing  Alert? Yes Normal Appearance?Yes  Oriented to person? Yes Place? Yes  Time? Yes  Recall of three objects? Yes  Can perform simple calculations? Yes  Displays appropriate judgment?Yes  Can read the correct time from a watch face?Yes   List the Names of Other Physician/Practitioners you currently use:  See referral list for the physicians patient is currently seeing.     Review of Systems:see above   Objective:     General appearance: Appears stated age and  obese  Head: Normocephalic, without obvious abnormality, atraumatic  Eyes: conj clear, EOMi PEERLA  Ears: normal TM's and external ear canals both ears  Nose: Nares normal. Septum midline. Mucosa normal. No drainage or sinus tenderness.  Throat: lips, mucosa, and tongue normal; teeth and gums normal  Neck: no adenopathy, no carotid bruit, no JVD, supple,  symmetrical, trachea midline and thyroid not enlarged, symmetric, no tenderness/mass/nodules  No CVA tenderness.  Lungs: clear to auscultation bilaterally  Breasts: normal appearance, no masses or tenderness, top of the pacemaker on left upper chest. Incision well-healed. It is tender.  Heart: regular rate and rhythm, S1, S2 normal, no murmur, click, rub or gallop  Abdomen: soft, non-tender; bowel sounds normal; no masses, no organomegaly  Musculoskeletal: ROM normal in all joints, no crepitus, no deformity, Normal muscle strengthen. Back  is symmetric, no curvature. Skin: Skin color, texture, turgor normal. No rashes or lesions  Lymph nodes: Cervical, supraclavicular, and axillary nodes normal.  Neurologic: CN 2 -12 Normal, Normal symmetric reflexes. Normal coordination and gait  Psych: Alert & Oriented x 3, Mood appear stable.    Assessment:    Annual wellness medicare exam   Plan:    During the course of the visit the patient was educated and counseled about appropriate screening and preventive services including:   Annual flu vaccine  Recommend mammogram  Needs Prevnar 13 apparently.  This is not in her vaccine history     Patient Instructions (the written plan) was given to the patient.  Medicare Attestation  I have personally reviewed:  The patient's medical and social history  Their use of alcohol, tobacco or illicit drugs  Their current medications and supplements  The patient's functional ability including ADLs,fall risks, home safety risks, cognitive, and hearing and visual impairment  Diet and physical activities  Evidence for depression or mood disorders  The patient's weight, height, BMI, and visual acuity have been recorded in the chart. I have made referrals, counseling, and provided education to the patient based on review of the above and I have provided the patient with a written personalized care plan for preventive services.

## 2019-12-14 ENCOUNTER — Other Ambulatory Visit: Payer: Medicare Other | Admitting: Internal Medicine

## 2019-12-14 DIAGNOSIS — N184 Chronic kidney disease, stage 4 (severe): Secondary | ICD-10-CM | POA: Diagnosis not present

## 2019-12-14 DIAGNOSIS — Z0000 Encounter for general adult medical examination without abnormal findings: Secondary | ICD-10-CM | POA: Diagnosis not present

## 2019-12-14 DIAGNOSIS — I10 Essential (primary) hypertension: Secondary | ICD-10-CM | POA: Diagnosis not present

## 2019-12-14 DIAGNOSIS — R5381 Other malaise: Secondary | ICD-10-CM | POA: Diagnosis not present

## 2019-12-14 DIAGNOSIS — F324 Major depressive disorder, single episode, in partial remission: Secondary | ICD-10-CM | POA: Diagnosis not present

## 2019-12-14 DIAGNOSIS — R7302 Impaired glucose tolerance (oral): Secondary | ICD-10-CM | POA: Diagnosis not present

## 2019-12-14 DIAGNOSIS — E782 Mixed hyperlipidemia: Secondary | ICD-10-CM | POA: Diagnosis not present

## 2019-12-15 LAB — CBC WITH DIFFERENTIAL/PLATELET
Absolute Monocytes: 646 cells/uL (ref 200–950)
Basophils Absolute: 29 cells/uL (ref 0–200)
Basophils Relative: 0.3 %
Eosinophils Absolute: 114 cells/uL (ref 15–500)
Eosinophils Relative: 1.2 %
HCT: 44 % (ref 35.0–45.0)
Hemoglobin: 14.8 g/dL (ref 11.7–15.5)
Lymphs Abs: 1862 cells/uL (ref 850–3900)
MCH: 29.7 pg (ref 27.0–33.0)
MCHC: 33.6 g/dL (ref 32.0–36.0)
MCV: 88.4 fL (ref 80.0–100.0)
MPV: 11.7 fL (ref 7.5–12.5)
Monocytes Relative: 6.8 %
Neutro Abs: 6850 cells/uL (ref 1500–7800)
Neutrophils Relative %: 72.1 %
Platelets: 178 10*3/uL (ref 140–400)
RBC: 4.98 10*6/uL (ref 3.80–5.10)
RDW: 12.2 % (ref 11.0–15.0)
Total Lymphocyte: 19.6 %
WBC: 9.5 10*3/uL (ref 3.8–10.8)

## 2019-12-15 LAB — HEMOGLOBIN A1C
Hgb A1c MFr Bld: 5.5 % of total Hgb (ref <5.7)
Mean Plasma Glucose: 111 (calc)
eAG (mmol/L): 6.2 (calc)

## 2019-12-15 LAB — COMPLETE METABOLIC PANEL WITH GFR
AG Ratio: 1.8 (calc) (ref 1.0–2.5)
ALT: 18 U/L (ref 6–29)
AST: 14 U/L (ref 10–35)
Albumin: 4.2 g/dL (ref 3.6–5.1)
Alkaline phosphatase (APISO): 60 U/L (ref 37–153)
BUN/Creatinine Ratio: 19 (calc) (ref 6–22)
BUN: 26 mg/dL — ABNORMAL HIGH (ref 7–25)
CO2: 23 mmol/L (ref 20–32)
Calcium: 10 mg/dL (ref 8.6–10.4)
Chloride: 106 mmol/L (ref 98–110)
Creat: 1.4 mg/dL — ABNORMAL HIGH (ref 0.60–0.93)
GFR, Est African American: 41 mL/min/{1.73_m2} — ABNORMAL LOW (ref >=60)
GFR, Est Non African American: 36 mL/min/{1.73_m2} — ABNORMAL LOW (ref >=60)
Globulin: 2.4 g/dL (calc) (ref 1.9–3.7)
Glucose, Bld: 108 mg/dL — ABNORMAL HIGH (ref 65–99)
Potassium: 4.3 mmol/L (ref 3.5–5.3)
Sodium: 142 mmol/L (ref 135–146)
Total Bilirubin: 0.5 mg/dL (ref 0.2–1.2)
Total Protein: 6.6 g/dL (ref 6.1–8.1)

## 2019-12-15 LAB — URINE CULTURE
MICRO NUMBER:: 1232825
SPECIMEN QUALITY:: ADEQUATE

## 2019-12-15 LAB — TSH: TSH: 4.78 mIU/L — ABNORMAL HIGH (ref 0.40–4.50)

## 2019-12-15 LAB — LIPID PANEL
Cholesterol: 221 mg/dL — ABNORMAL HIGH (ref <200)
HDL: 72 mg/dL (ref >=50)
LDL Cholesterol (Calc): 127 mg/dL (calc) — ABNORMAL HIGH
Non-HDL Cholesterol (Calc): 149 mg/dL (calc) — ABNORMAL HIGH (ref <130)
Total CHOL/HDL Ratio: 3.1 (calc) (ref <5.0)
Triglycerides: 110 mg/dL (ref <150)

## 2019-12-15 MED ORDER — LEVOTHYROXINE SODIUM 50 MCG PO TABS: 50.0000 ug | ORAL_TABLET | Freq: Every day | ORAL | 0 refills | Status: DC

## 2019-12-19 ENCOUNTER — Other Ambulatory Visit: Payer: Self-pay | Admitting: Internal Medicine

## 2019-12-22 ENCOUNTER — Other Ambulatory Visit: Payer: Self-pay

## 2019-12-22 ENCOUNTER — Other Ambulatory Visit: Payer: Self-pay | Admitting: Internal Medicine

## 2019-12-22 MED ORDER — SERTRALINE HCL 50 MG PO TABS: 50.0000 mg | ORAL_TABLET | Freq: Every day | ORAL | 3 refills | Status: DC

## 2019-12-27 ENCOUNTER — Other Ambulatory Visit: Payer: Self-pay

## 2020-01-03 ENCOUNTER — Other Ambulatory Visit: Payer: Self-pay | Admitting: Internal Medicine

## 2020-01-17 ENCOUNTER — Other Ambulatory Visit: Payer: Self-pay | Admitting: Internal Medicine

## 2020-02-08 ENCOUNTER — Other Ambulatory Visit: Payer: Self-pay

## 2020-02-08 ENCOUNTER — Other Ambulatory Visit: Payer: Medicare Other | Admitting: Internal Medicine

## 2020-02-08 DIAGNOSIS — E039 Hypothyroidism, unspecified: Secondary | ICD-10-CM | POA: Diagnosis not present

## 2020-02-08 LAB — TSH: TSH: 1.29 mIU/L (ref 0.40–4.50)

## 2020-02-10 ENCOUNTER — Other Ambulatory Visit: Payer: Self-pay

## 2020-02-10 ENCOUNTER — Ambulatory Visit (INDEPENDENT_AMBULATORY_CARE_PROVIDER_SITE_OTHER): Payer: Medicare Other | Admitting: Internal Medicine

## 2020-02-10 ENCOUNTER — Encounter: Payer: Self-pay | Admitting: Internal Medicine

## 2020-02-10 VITALS — BP 130/80 | HR 83 | Temp 98.0°F | Ht 64.0 in | Wt 189.0 lb

## 2020-02-10 DIAGNOSIS — Z7189 Other specified counseling: Secondary | ICD-10-CM

## 2020-02-10 DIAGNOSIS — Z7185 Encounter for immunization safety counseling: Secondary | ICD-10-CM

## 2020-02-10 DIAGNOSIS — E039 Hypothyroidism, unspecified: Secondary | ICD-10-CM | POA: Diagnosis not present

## 2020-02-10 NOTE — Progress Notes (Signed)
   Subjective:    Patient ID: [name], female    DOB: 07-25-1941, 79 y.o.   MRN: [id number]  HPI 79 year old female with history of impaired glucose tolerance and hypothyroidism in today for follow-up on elevated TSH in December which at that time was 4.78 and previously had always been within normal limits on levothyroxine 50 mcg daily.  She denied noncompliance at the time.  I thought she perhaps missed a dose and was taking it with food and not absorbing it.  I asked her to take it on empty stomach and follow-up here today.  TSH is now normal on levothyroxine 50 mcg daily and she says she is taking it consistently on an empty stomach.  She tried to sign up for COVID-19 vaccine through the health department but could not get through for an appointment so we have given her contact information for [facility] vaccine clinic at [facility].    Review of Systems     Objective:   Physical Exam Blood pressure 130/80 pulse 83, BMI 32.44 weight 189 pounds height 5 feet 4 inches.  No thyromegaly on exam Affect is normal      Assessment & Plan:  Hypothyroidism with recent elevated TSH.  Advised patient to take medication on empty stomach and follow-up TSH is within normal limits.  No change in dose.  Covid-19 Information provided.  She would like to take the vaccine.  Plan: Made appointment in December for Medicare wellness, health maintenance exam and evaluation of medical issues with fasting labs.

## 2020-02-10 NOTE — Patient Instructions (Signed)
It was a pleasure to see you today.  COVID-19 vaccine information given.  Continue levothyroxine 50 mcg daily and follow-up with health maintenance exam and fasting labs in December.

## 2020-02-17 ENCOUNTER — Ambulatory Visit: Payer: Medicare Other | Attending: Internal Medicine

## 2020-02-17 DIAGNOSIS — Z23 Encounter for immunization: Secondary | ICD-10-CM | POA: Insufficient documentation

## 2020-02-17 NOTE — Progress Notes (Signed)
   Covid-19 Vaccination Clinic  Name:  [name]    MRN: [id number] DOB: 11/29/1941  02/17/2020  [name] was observed post Covid-19 immunization for 15 minutes without incident. She was provided with Vaccine Information Sheet and instruction to access the V-Safe system.   [name] was instructed to call 911 with any severe reactions post vaccine: Marland Kitchen Difficulty breathing  . Swelling of face and throat  . A fast heartbeat  . A bad rash all over body  . Dizziness and weakness   Immunizations Administered    Name Date Dose VIS Date Route   Pfizer COVID-19 Vaccine 02/17/2020  2:51 PM 0.3 mL 11/26/2019 Intramuscular   Manufacturer: [place]   Lot: [id number]   [place]: [id number]

## 2020-03-01 ENCOUNTER — Other Ambulatory Visit: Payer: Self-pay | Admitting: Internal Medicine

## 2020-03-15 ENCOUNTER — Ambulatory Visit: Payer: Medicare Other | Attending: Internal Medicine

## 2020-03-15 DIAGNOSIS — Z23 Encounter for immunization: Secondary | ICD-10-CM

## 2020-03-15 NOTE — Progress Notes (Signed)
   Covid-19 Vaccination Clinic  Name:  [name]    MRN: [id number] DOB: 01/16/1941  03/15/2020  [name] was observed post Covid-19 immunization for 15 minutes without incident. She was provided with Vaccine Information Sheet and instruction to access the V-Safe system.   [name] was instructed to call 911 with any severe reactions post vaccine: Marland Kitchen Difficulty breathing  . Swelling of face and throat  . A fast heartbeat  . A bad rash all over body  . Dizziness and weakness   Immunizations Administered    Name Date Dose VIS Date Route   Pfizer COVID-19 Vaccine 03/15/2020  1:22 PM 0.3 mL 11/26/2019 Intramuscular   Manufacturer: [employer]   Lot: [id number]   [place]: [id number]

## 2020-06-13 ENCOUNTER — Other Ambulatory Visit: Payer: Self-pay | Admitting: Internal Medicine

## 2020-09-27 ENCOUNTER — Ambulatory Visit: Payer: Medicare Other | Admitting: Internal Medicine

## 2020-10-04 ENCOUNTER — Telehealth: Payer: Self-pay | Admitting: Internal Medicine

## 2020-10-04 NOTE — Telephone Encounter (Signed)
Unable to LVM, mail box was full, trying to call to reschedule CPE to Wednesday 12/13/2020

## 2020-10-06 ENCOUNTER — Ambulatory Visit: Payer: Medicare Other | Admitting: Internal Medicine

## 2020-10-09 ENCOUNTER — Ambulatory Visit: Payer: Medicare Other | Admitting: Internal Medicine

## 2020-10-24 ENCOUNTER — Other Ambulatory Visit: Payer: Self-pay | Admitting: Internal Medicine

## 2020-11-22 ENCOUNTER — Telehealth: Payer: Self-pay | Admitting: Internal Medicine

## 2020-11-22 NOTE — Telephone Encounter (Signed)
Faxed Office Notes along with signed order for Blood Pressure Monitor to [facility]

## 2020-12-11 ENCOUNTER — Other Ambulatory Visit: Payer: Medicare Other | Admitting: Internal Medicine

## 2020-12-12 ENCOUNTER — Other Ambulatory Visit: Payer: Medicare Other | Admitting: Internal Medicine

## 2020-12-14 ENCOUNTER — Telehealth: Payer: Self-pay | Admitting: Internal Medicine

## 2020-12-14 ENCOUNTER — Encounter: Payer: Self-pay | Admitting: Internal Medicine

## 2020-12-14 ENCOUNTER — Encounter: Payer: Medicare Other | Admitting: Internal Medicine

## 2020-12-14 NOTE — Telephone Encounter (Signed)
[name] called at 9:00am on the day of her CPE appointment to cancel because she stated she had diarrhea and vomiting, will call back to reschedule.

## 2020-12-14 NOTE — Telephone Encounter (Signed)
Spoke with great niece [name] as patient [name] for CPE nor labs this week. Called one day asking for directions to office from [employer]. Today she says she has diarrhea and vomiting. Would like [place] tocheck on patient today. Also, would like family to conside if patient needs in-care home assistance or placement in rest home. May not need to be driving. Patient says she will try to do this. Advised that appt needs to be rescheduled after the first of the year. MJB,MD

## 2020-12-21 ENCOUNTER — Telehealth: Payer: Self-pay | Admitting: Internal Medicine

## 2020-12-21 ENCOUNTER — Other Ambulatory Visit: Payer: Medicare Other | Admitting: Internal Medicine

## 2020-12-21 ENCOUNTER — Other Ambulatory Visit: Payer: Self-pay | Admitting: Internal Medicine

## 2020-12-21 NOTE — Telephone Encounter (Signed)
Patient's phone mail box if full and cannot leave message. Left voice mail message with her sister, Essie Christine. Refiolled Coreg x 30 days only. Cannot keep refilling meds with out OV and labs. MJB. MD

## 2020-12-22 ENCOUNTER — Other Ambulatory Visit: Payer: Medicare Other | Admitting: Internal Medicine

## 2020-12-22 ENCOUNTER — Telehealth: Payer: Self-pay | Admitting: Internal Medicine

## 2020-12-22 ENCOUNTER — Other Ambulatory Visit: Payer: Self-pay | Admitting: *Deleted

## 2020-12-22 NOTE — Patient Outreach (Signed)
[facility]) Care Management  12/22/2020  [name] [birth year] [id number]  Referral Received 12/22/2020 Initial Outreach 12/22/2020 Telephone Assessment completed  Spoke with the pt and explained the purpose for today's call. Pt very receptive and explained her absence from her provider's office. States she continue to drive but "just can't seen to remember where the office is located". RN verified pt lives alone with a niece [name]), sister ([name]) and a neighbor who assist on occassions. RN further inquired on how pt continue to manager her ADLs and offered available assistance. Pt declined aide services and states she continues to pay her owe bills, completed her own grocery shopping, cooks her meals and manages all her ongoing medical issues. RN inquired on her HTN as pt states she has several blood pressures devices but doesn't know how to read the readings. Pt denies any related symptom and states she motivates herself to get going to do the things that need to be done but just having issues getting to her provider's office. RN offered SCATs services and explained the process however pt adamantly declined this services.   Based upon pt's inability to get to her provider's office and explaining the importance of health. RN was able to get pt to agree to [facility] performing a home visit to assist her needs further. Note long discussed in getting pt to accept this services coming into her home. Further educated pt on safety, fall prevention and maintaining her health with adherence to her medications (refused to review today) and all medical appointments. Pt verbalized an understanding and again willing to allow [facility] to visit to assist with some of her ongoing needs. RN also offered to enroll pt into the [facility] program for safety/fall prevention as we continue to work with her receive available assistance concerning her safety.  RN informed pt that her  provider will be updated on her disposition with [facility] and a referral for [facility] will be initiated on Monday. No other issues presented at this time as RN will continue to work with pt as allow in promoting the needed assistance in the home for pt's safety. Note pt again declined RN reaching out to her niece [name] and was able to answer all questions appropriately however delayed cognitively throughout today's assistance. Will continue to re-evaluate pt's safety in the home on the monthly follow up calls.   1/10 Addendum-Made a referral to [facility] [name]) with details concerning pt's needs.  Spoke with [name] at [employer] who indicated they extended services and offered a home visit however pt declined. Agency allowed to followed up in a week. Pt indicated she visited her primary provider today with no issues reported. RN will reach out the Dr. Verlene Gomez office with update.    Goals Addressed            This Visit's Progress   . [facility]-Make and Keep All Appointments       Timeframe:  Short-Term Goal Priority:  High Start Date:     12/22/2020                        Expected End Date:      02/12/2021                 Follow Up Date 01/23/2021   - ask family or friend for a ride - call to cancel if needed - keep a calendar with prescription refill  dates - keep a calendar with appointment dates - use public transportation -Declined SCATs   Why is this important?    Part of staying healthy is seeing the doctor for follow-up care.   If you forget your appointments, there are some things you can do to stay on track.    Notes: Offered to contact her niece for additional assistance however pt declined.     . [facility]-Prevent Falls and Injury       Timeframe:  Long-Range Goal Priority:  Medium Start Date:    12/22/2020                         Expected End Date:     04/12/2021                   Follow Up Date 01/23/2021   - always use handrails on the stairs - always wear low-heeled  or flat shoes or slippers with nonskid soles - call the doctor if I am feeling too drowsy - keep my cell phone with me always - learn how to get back up if I fall - make an emergency alert plan in case I fall - pick up clutter from the floors - use a nonslip pad with throw rugs, or remove them completely - use a cane or walker - use a nightlight in the bathroom - wear my glasses and/or hearing aid    Why is this important?    Most falls happen when it is hard for you to walk safely. Your balance may be off because of an illness. You may have pain in your knees, hip or other joints.   You may be overly tired or taking medicines that make you sleepy. You may not be able to see or hear clearly.   Falls can lead to broken bones, bruises or other injuries.   There are things you can do to help prevent falling.     Notes: Pt has auto lights in the home that come on at sunset and goes off in the AM. All rugs and clutter pt verifies has been removed with clear pathways. No other obstacles or barriers throughout the home.     . [facility]-Protect My Health       Timeframe:  Long-Range Goal Priority:  Medium Start Date:  12/22/2020                           Expected End Date:    04/12/2021                   Follow Up Date 01/23/2021   - schedule recommended health tests (blood work, mammogram, colonoscopy, pap test) - schedule and keep appointment for annual check-up    Why is this important?    Screening tests can find diseases early when they are easier to treat.   Your doctor or nurse will talk with you about which tests are important for you.   Getting shots for common diseases like the flu and shingles will help prevent them.     Notes: offered options for transport services and/or other resources for possible home visit ([facility]) was decided. Will make the proper referral on Monday during business hours.        Sierra Mina, RN Care Management Coordinator [facility] [phone]

## 2020-12-22 NOTE — Telephone Encounter (Signed)
She cannot find her way here. Does not think she can drive on [address]. Refuses to take Taxi. Cancel blood work today. Says she has been driving for 4 hours. Called her niece [place] who lives out of town, I think, [phone] and could not leave message. She should not be driving alone.

## 2020-12-25 ENCOUNTER — Encounter: Payer: Self-pay | Admitting: *Deleted

## 2020-12-25 ENCOUNTER — Other Ambulatory Visit: Payer: Self-pay

## 2020-12-25 ENCOUNTER — Encounter: Payer: Self-pay | Admitting: Internal Medicine

## 2020-12-25 ENCOUNTER — Ambulatory Visit (INDEPENDENT_AMBULATORY_CARE_PROVIDER_SITE_OTHER): Payer: Medicare Other | Admitting: Internal Medicine

## 2020-12-25 VITALS — BP 120/70 | HR 90 | Temp 98.7°F | Ht 64.0 in | Wt 172.0 lb

## 2020-12-25 DIAGNOSIS — R5381 Other malaise: Secondary | ICD-10-CM | POA: Diagnosis not present

## 2020-12-25 DIAGNOSIS — I10 Essential (primary) hypertension: Secondary | ICD-10-CM | POA: Diagnosis not present

## 2020-12-25 DIAGNOSIS — M170 Bilateral primary osteoarthritis of knee: Secondary | ICD-10-CM

## 2020-12-25 DIAGNOSIS — E782 Mixed hyperlipidemia: Secondary | ICD-10-CM

## 2020-12-25 DIAGNOSIS — N184 Chronic kidney disease, stage 4 (severe): Secondary | ICD-10-CM | POA: Diagnosis not present

## 2020-12-25 DIAGNOSIS — Z96642 Presence of left artificial hip joint: Secondary | ICD-10-CM

## 2020-12-25 DIAGNOSIS — Z9889 Other specified postprocedural states: Secondary | ICD-10-CM

## 2020-12-25 DIAGNOSIS — Z86010 Personal history of colonic polyps: Secondary | ICD-10-CM | POA: Diagnosis not present

## 2020-12-25 DIAGNOSIS — Z0000 Encounter for general adult medical examination without abnormal findings: Secondary | ICD-10-CM | POA: Diagnosis not present

## 2020-12-25 DIAGNOSIS — Z7185 Encounter for immunization safety counseling: Secondary | ICD-10-CM

## 2020-12-25 DIAGNOSIS — E039 Hypothyroidism, unspecified: Secondary | ICD-10-CM

## 2020-12-25 DIAGNOSIS — Z96641 Presence of right artificial hip joint: Secondary | ICD-10-CM | POA: Diagnosis not present

## 2020-12-25 DIAGNOSIS — L821 Other seborrheic keratosis: Secondary | ICD-10-CM

## 2020-12-25 DIAGNOSIS — Z8659 Personal history of other mental and behavioral disorders: Secondary | ICD-10-CM

## 2020-12-25 DIAGNOSIS — G3184 Mild cognitive impairment, so stated: Secondary | ICD-10-CM

## 2020-12-25 LAB — POCT URINALYSIS DIPSTICK
Appearance: NEGATIVE
Bilirubin, UA: NEGATIVE
Blood, UA: NEGATIVE
Glucose, UA: NEGATIVE
Ketones, UA: NEGATIVE
Leukocytes, UA: NEGATIVE
Nitrite, UA: NEGATIVE
Odor: NEGATIVE
Protein, UA: NEGATIVE
Spec Grav, UA: 1.01 (ref 1.010–1.025)
Urobilinogen, UA: 0.2 E.U./dL
pH, UA: 6.5 (ref 5.0–8.0)

## 2020-12-25 MED ORDER — DONEPEZIL HCL 5 MG PO TABS: 5.0000 mg | ORAL_TABLET | Freq: Every day | ORAL | 3 refills | Status: DC

## 2020-12-25 NOTE — Progress Notes (Signed)
Subjective:    Patient ID: [name], female    DOB: [birth year], 80 y.o.   MRN: [id number]  HPI 80 year old Female seen for health maintenance exam and evaluation of medical issues.  Apparently after missing her lab appointment last week, she was able to find her way on [address] to get to the office today.  We had [facility]  reach out to her as well.  SCAT services were offered but she declined.  We had suggested that she take a taxi and she declined that as well.  Patient is alone today and says she drove herself to the appointment.  She continues to reside alone.  She has a longstanding history of depression currently being treated with sotalol.  No longer taking Wellbutrin according to our records.  History of hypertension treated with atenolol, Coreg, HCTZ and amlodipine.  History of hypothyroidism treated with Synthroid.  History of allergic rhinitis.  History of mixed hyperlipidemia but currently is not on statin medication.  Labs are drawn today and are pending.  She is status post bilateral hip arthroplasties  by Dr.Alusio.  Right hip surgery January 2013 and left hip surgery June 2013.  History of chronic kidney disease, right thyroid nodule, remote history of mitral valve prolapse, history of adenomatous colon polyp.  In February 2012 she had a pancreaticoduodenectomy and pancreatic stent placed with a Whipple procedure for benign tumor.  History of serous microcystic adenoma of the pancreas.  She is now treated with Pancrease.  If she does not take it, she has diarrhea.  Not motivated to diet and exercise.  Has osteoarthritis of her knees and is overweight.  Last colonoscopy was in 2006.  She had lumbar fusion L4-L5 in June 2011.  She had anterior cervical decompression C6-C7 with fusion in 2008.  Had Bartholin cyst removed over 30 years ago.  History of elevated uric acid and was started on allopurinol in 2009.  She continues to take that according to our records.  History of  pyelonephritis 2011 treated as an outpatient.  In 2009 she was found to have an elevated serum creatinine of 1.4 likely due to nephrosclerosis.  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  She is a widow.  No children.  Does not smoke.  Occasionally will drink some wine.  Discussed once again today her living alone and whether or not she needs more assistance.  She does feel she does not need more assistance and feels that she should be able to continue to drive short distances.  She has not really made any preparations to move to a retirement facility nor does she know who would look after her if her health began to fail.  Patient had syncopal episode May 2016 when she was under stress.  She had a negative work-up at that time.  She had herpes zoster affecting her left trunk at the same time she had syncope.  Family history: Mother died of complications of dementia with history of stroke.  Brother died from kidney cancer.  Another brother with history of GI cancer who resides in [place] she has a great niece in [place] and the niece in [place].  She is alert and oriented x3 today.    Review of Systems see above     Objective:   Physical Exam Vital signs reviewed.  Blood pressure excellent 120/70 pulse 90 temperature 98.7 degrees pulse oximetry 96% weight 172 pounds BMI 29.52  Skin is warm and dry.  She has multiple seborrheic keratoses on her back and chest area.  Nodes none.  TMs are clear.  Neck is supple without JVD thyromegaly or carotid bruits.  Chest is clear to auscultation.  Cardiac exam regular rate and rhythm normal S1 and S2 without murmurs.  Breast without masses.  Abdomen is soft nondistended without hepatosplenomegaly masses or tenderness.  No lower extremity pitting edema.  Brief neurological exam intact without focal deficits.  Bimanual exam is normal.  No stool black.  Rectovaginal confirms.       Assessment  & Plan:  Essential hypertension-stable on current regimen  Multiple seborrheic keratoses  Suspect mild memory loss and will place her on Aricept today.  Living alone-once again discussion regarding her situation.  Have involved [facility] as well and they have reached out to her.  Hypothyroidism-labs drawn and pending  Hyperlipidemia-labs drawn and pending  History of chronic kidney disease-labs drawn and pending  Elevated BMI 29.2-not really able to exercise.  Advised her to watch snacking.  Ask her to try to walk some.  History of allergic rhinitis treated with Flonase and Zyrtec  Plan: Labs drawn and pending.  She needs to get third COVID-vaccine at pharmacy.  She says she will do that as soon as possible.  Return in 1 year or as needed.  Subjective:   Patient presents for Medicare Annual/Subsequent preventive examination.  Review Past Medical/Family/Social: See above   Risk Factors  Current exercise habits: Not a lot of exercise Dietary issues discussed: Discussed low-fat low carbohydrate diet  Cardiac risk factors: Hyperlipidemia  Depression Screen  (Note: if answer to either of the following is "Yes", a more complete depression screening is indicated)   Over the past two weeks, have you felt down, depressed or hopeless?  Yes-the pandemic has not helped Over the past two weeks, have you felt little interest or pleasure in doing things? No Have you lost interest or pleasure in daily life? No Do you often feel hopeless?  Yes sometimes do you cry easily over simple problems? No   Activities of Daily Living  In your present state of health, do you have any difficulty performing the following activities?:   Driving? No  Managing money? No  Feeding yourself? No  Getting from bed to chair? No  Climbing a flight of stairs? No  Preparing food and eating?: No  Bathing or showering? No  Getting dressed: No  Getting to the toilet? No  Using the toilet:No  Moving around from  place to place: No  In the past year have you fallen or had a near fall?:  Yes Are you sexually active? No  Do you have more than one partner? No   Hearing Difficulties:  Do you often ask people to speak up or repeat themselves? No  Do you experience ringing or noises in your ears? No  Do you have difficulty understanding soft or whispered voices? No  Do you feel that you have a problem with memory?  Yes sometimes Do you often misplace items? No    Home Safety:  Do you have a smoke alarm at your residence?  None Do you have grab bars in the bathroom?  No Do you have throw rugs in your house?  He has   Cognitive Testing  Alert? Yes Normal Appearance?Yes  Oriented to person? Yes Place? Yes  Time? Yes  Recall of three objects?  Not tested Can perform simple calculations?  Not tested Displays  appropriate judgment?Yes  Can read the correct time from a watch face?Yes   List the Names of Other Physician/Practitioners you currently use:  See referral list for the physicians patient is currently seeing.     Review of Systems: See above   Objective:     General appearance: Appears stated age and mildly obese  Head: Normocephalic, without obvious abnormality, atraumatic  Eyes: conj clear, EOMi PEERLA  Ears: normal TM's and external ear canals both ears  Nose: Nares normal. Septum midline. Mucosa normal. No drainage or sinus tenderness.  Throat: lips, mucosa, and tongue normal; teeth and gums normal  Neck: no adenopathy, no carotid bruit, no JVD, supple, symmetrical, trachea midline and thyroid not enlarged, symmetric, no tenderness/mass/nodules  No CVA tenderness.  Lungs: clear to auscultation bilaterally  Breasts: normal appearance, no masses or tenderness Heart: regular rate and rhythm, S1, S2 normal, no murmur, click, rub or gallop  Abdomen: soft, non-tender; bowel sounds normal; no masses, no organomegaly  Musculoskeletal: ROM normal in all joints, no crepitus, no  deformity, Normal muscle strengthen. Back  is symmetric, no curvature. Skin: Multiple seborrheic keratoses Lymph nodes: Cervical, supraclavicular, and axillary nodes normal.  Neurologic: CN 2 -12 Normal, Normal symmetric reflexes. Normal coordination and gait  Psych: Alert & Oriented x 3, Mood appear stable.    Assessment:    Annual wellness medicare exam   Plan:    During the course of the visit the patient was educated and counseled about appropriate screening and preventive services including:   Needs third COVID-vaccine  Should get annual mammogram  No stool to guaiac today and  likely will not cooperate with Cologuard     Patient Instructions (the written plan) was given to the patient.  Medicare Attestation  I have personally reviewed:  The patient's medical and social history  Their use of alcohol, tobacco or illicit drugs  Their current medications and supplements  The patient's functional ability including ADLs,fall risks, home safety risks, cognitive, and hearing and visual impairment  Diet and physical activities  Evidence for depression or mood disorders  The patient's weight, height, BMI, and visual acuity have been recorded in the chart. I have made referrals, counseling, and provided education to the patient based on review of the above and I have provided the patient with a written personalized care plan for preventive services.

## 2020-12-25 NOTE — Patient Instructions (Addendum)
It was a pleasure to see you today.  Labs drawn and pending.  We will contact you with results.  Please go to your local pharmacy and get third COVID-vaccine.  Continue current medications.  We have added Aricept 5 mg daily for mild memory loss.  Return in 1 year or as needed.  Please have mammogram in the future.  Be careful driving around town.

## 2021-01-02 ENCOUNTER — Other Ambulatory Visit: Payer: Self-pay | Admitting: Internal Medicine

## 2021-01-13 ENCOUNTER — Other Ambulatory Visit: Payer: Self-pay | Admitting: Internal Medicine

## 2021-01-15 ENCOUNTER — Other Ambulatory Visit: Payer: Self-pay | Admitting: Internal Medicine

## 2021-01-16 ENCOUNTER — Encounter: Payer: Self-pay | Admitting: *Deleted

## 2021-01-16 ENCOUNTER — Other Ambulatory Visit: Payer: Self-pay | Admitting: *Deleted
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
## 2021-01-26 ENCOUNTER — Other Ambulatory Visit: Payer: Self-pay | Admitting: Internal Medicine

## 2021-02-11 ENCOUNTER — Other Ambulatory Visit: Payer: Self-pay | Admitting: Internal Medicine

## 2021-02-12 ENCOUNTER — Other Ambulatory Visit: Payer: Self-pay | Admitting: Internal Medicine

## 2021-02-13 ENCOUNTER — Other Ambulatory Visit: Payer: Self-pay | Admitting: *Deleted

## 2021-02-13 NOTE — Patient Outreach (Signed)
[place] [facility]) Care Management  02/13/2021  [name] Jun 10, 1941 [id number]   Telephone Assessment-Unsuccessful  RN attempted outreach call however unsuccessful. Unable to leave a HIPAA message. Will rescheduled another outreach call over the next week.  [name], RN Care Management Coordinator [place] Office [phone]

## 2021-02-19 ENCOUNTER — Encounter: Payer: Self-pay | Admitting: *Deleted

## 2021-02-19 ENCOUNTER — Other Ambulatory Visit: Payer: Self-pay | Admitting: *Deleted

## 2021-02-19 NOTE — Patient Outreach (Signed)
[place] [facility]) Care Management  02/19/2021  [name] 1941/05/06 [id number]   Telephone Assessment-Unsuccessful  RN attempted outreach call however unsuccessful. RN not able to leave a HIPAA voice message as pt's mailbox was full.  Will attempt another outreach call over the week for ongoing services.  [name], RN Care Management Coordinator [place] Office [phone]

## 2021-02-23 ENCOUNTER — Other Ambulatory Visit: Payer: Self-pay | Admitting: *Deleted
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
## 2021-03-26 ENCOUNTER — Other Ambulatory Visit: Payer: Self-pay | Admitting: *Deleted

## 2021-03-26 NOTE — Patient Outreach (Signed)
[facility]) Care Management  03/26/2021  [name] [birth year] [id number]   Telephone Assessment-Unsuccessful  RN attempted outreach call to pt today however unsuccessful and unable to leave a message.   Will scheduled another outreach call over the next week for ongoing [facility] services.  [name], RN Care Management Coordinator [place] Office [phone]

## 2021-03-29 ENCOUNTER — Other Ambulatory Visit: Payer: Self-pay | Admitting: *Deleted

## 2021-03-29 NOTE — Patient Outreach (Signed)
[place] [facility]) Care Management  03/29/2021  [name] [birth year] [id number]   Telephone Assessment-Unsuccessful-Preventive Measures/Safety  RN attempted outreach call today however unsuccessful. RN unable to leave a HIPAA message at this time.  Will rescheduled an outreach call over the next week for ongoing [facility] services.  [name], RN Care Management Coordinator [place] Office [phone]

## 2021-04-04 ENCOUNTER — Other Ambulatory Visit: Payer: Self-pay | Admitting: Internal Medicine

## 2021-04-05 ENCOUNTER — Other Ambulatory Visit: Payer: Self-pay | Admitting: *Deleted
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
## 2021-04-19 ENCOUNTER — Telehealth: Payer: Self-pay | Admitting: Internal Medicine
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
## 2021-04-19 NOTE — Telephone Encounter (Signed)
OK 

## 2021-05-04 ENCOUNTER — Other Ambulatory Visit: Payer: Self-pay | Admitting: *Deleted

## 2021-05-04 NOTE — Patient Outreach (Signed)
[place] [facility]) Care Management  05/04/2021  JANETTE HARVIE June 10, 1941 [id number]   Telephone Assessment-Successful-Safety  RN spoke with pt today and verified she continue to driver herself to all medical appointments with no problems or missed appointments. [name] states her memory continues to be good with managing her ongoing care. States she doesn't go out much due to the hot weather but has sufficient supplies and food with no problems. Pt also has supportive care (family) that continue to assist if needed.     Plan of care reviewed and discussed with ongoing updates accordingly. No acute issues reported over the last month as pt continues to do well with no acute needs or issues at this time. Will update the plan of care and follow up next month with pt's ongoing progress.  Goals Addressed            This Visit's Progress   . THN-Protect My Health   On track    Follow Up Date 06/04/2021 Timeframe:  Long-Range Goal Priority:  Medium Start Date:  12/22/2020                           Expected End Date:    07/13/2021                   Barriers: Health Behaviors    - schedule recommended health tests (blood work, mammogram, colonoscopy, pap test) - schedule and keep appointment for annual check-up    Why is this important?    Screening tests can find diseases early when they are easier to treat.   Your doctor or nurse will talk with you about which tests are important for you.   Getting shots for common diseases like the flu and shingles will help prevent them.     Notes:  4/21-Pt reports she continue to do well with no additional episodes of being lost driving and able to get to all her medical appointments, get her medications and run all errands with no reported problems. Feb- pt continue to drive only during the day. Continue to await [facility] to consult.Will continue to encouraged adherence to the plan of care. Pt well prepared for stormy weather with  plenty food.  Jan-offered options for transport services and/or other resources for possible home visit ([facility]) was decided. Will make the proper referral on Monday during business hours.        [name], RN Care Management Coordinator [place] Office [phone]

## 2021-05-10 ENCOUNTER — Telehealth: Payer: Self-pay | Admitting: Internal Medicine

## 2021-05-10 NOTE — Telephone Encounter (Signed)
She needs to see her Orthopedist.
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
## 2021-05-22 NOTE — Telephone Encounter (Signed)
Patient called back about her hop, I let her know she needs to go and see an orthopedic doctor. I gave her the phone numbers for [facility], (Dr Ninfa Linden), [facility] and [facility]. She said she thought she could find the one on [address]

## 2021-06-04 ENCOUNTER — Other Ambulatory Visit: Payer: Self-pay | Admitting: *Deleted
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
## 2021-06-20 ENCOUNTER — Other Ambulatory Visit: Payer: Self-pay | Admitting: Internal Medicine

## 2021-07-04 ENCOUNTER — Ambulatory Visit: Payer: Self-pay | Admitting: *Deleted

## 2021-07-05 ENCOUNTER — Other Ambulatory Visit: Payer: Self-pay | Admitting: *Deleted

## 2021-07-05 NOTE — Patient Outreach (Signed)
[place] [facility]) Care Management  07/05/2021  [name] March 30, 1941 [id number]   Telephone Assessment-Successful-Other Preventive Measures  RN spoke with pt today and updated the current plan of care. Pt continues to  do well with no acute issues or reported needs as the plan of care along with interventions and goals were discussed today.  Verified pt has all her needed refills and taking all medications as prescribed. Pt continues to drive an not getting loss. Niece continues to assist the pt when needed.   Will follow up  next month and if pt continues to do well will scheduled out on quarterly for ongoing csse management services.     Goals Addressed             This Visit's Progress    THN-Protect My Health   On track    Follow Up Date 08/03/2021 Timeframe:  Long-Range Goal Priority:  Medium Start Date:  12/22/2020                           Expected End Date:    08/15/2021                   Barriers: Health Behaviors    - schedule recommended health tests (blood work, mammogram, colonoscopy, pap test) - schedule and keep appointment for annual check-up    Why is this important?   Screening tests can find diseases early when they are easier to treat.  Your doctor or nurse will talk with you about which tests are important for you.  Getting shots for common diseases like the flu and shingles will help prevent them.     Notes:  4/21-Pt reports she continue to do well with no additional episodes of being lost driving and able to get to all her medical appointments, get her medications and run all errands with no reported problems. Feb- pt continue to drive only during the day. Continue to await [facility] to consult.Will continue to encouraged adherence to the plan of care. Pt well prepared for stormy weather with plenty food.  Jan-offered options for transport services and/or other resources for possible home visit ([facility]) was decided. Will make the  proper referral on Monday during business hours.          [name], RN Care Management Coordinator [place] Office [phone]

## 2021-07-11 ENCOUNTER — Telehealth: Payer: Self-pay | Admitting: Internal Medicine

## 2021-07-11 ENCOUNTER — Other Ambulatory Visit: Payer: Self-pay | Admitting: Internal Medicine

## 2021-07-11 NOTE — Telephone Encounter (Signed)
I attempted personally to call patient about thyroid replacement medication refill. The mail box was full. Patient NEVER returned for labs as promised earlier this year after her visit. We cannot continue to refill meds without lab work. The labs need to be drawn when she has been on thyroid replacement med consistently for 4-6 weeks. This has become exceedingly time consuming and frustrating for staff and myself in this office. Therefore, I am asking that patient find another primary care physician at this time. Emeline General, MD

## 2021-07-11 NOTE — Telephone Encounter (Signed)
Decline refill to pharmacy on thyroid replacement medication. Patient never returned for labs as reuqested

## 2021-07-12 ENCOUNTER — Encounter: Payer: Self-pay | Admitting: Internal Medicine

## 2021-07-12 ENCOUNTER — Telehealth: Payer: Self-pay | Admitting: Internal Medicine

## 2021-07-12 NOTE — Telephone Encounter (Signed)
Talked by phone with patient's niece, Jean Rosenthal lives out of town. [place] thought per conversation with Ms. Ressler that she had been here recently. Patient not seen here since January and needs follow up on thyroid condition. No labs done because patient never returned for lab work. Letter of dismissal sent and [place] advised this is due to lack of follow up. MJB, MD

## 2021-07-13 ENCOUNTER — Telehealth: Payer: Self-pay | Admitting: Internal Medicine

## 2021-07-13 MED ORDER — SERTRALINE HCL 50 MG PO TABS: 50.0000 mg | ORAL_TABLET | Freq: Every day | ORAL | 0 refills | Status: AC

## 2021-07-13 MED ORDER — AMLODIPINE BESYLATE 5 MG PO TABS: 5.0000 mg | ORAL_TABLET | Freq: Every day | ORAL | 0 refills | Status: AC

## 2021-07-13 MED ORDER — LEVOTHYROXINE SODIUM 50 MCG PO TABS: ORAL_TABLET | ORAL | 0 refills | Status: AC

## 2021-07-13 MED ORDER — CARVEDILOL 12.5 MG PO TABS: 12.5000 mg | ORAL_TABLET | Freq: Two times a day (BID) | ORAL | 1 refills | Status: DC

## 2021-07-13 MED ORDER — HYDROCHLOROTHIAZIDE 25 MG PO TABS: 25.0000 mg | ORAL_TABLET | Freq: Every day | ORAL | 0 refills | Status: DC

## 2021-07-13 NOTE — Telephone Encounter (Signed)
Patient called today. Her niece, [name] told her we had tried to get in touch. See previous conversation with [name]. I spoke with patient personally. Explained we were asking her to find another physician due to lack of follow up. Have refilled meds for short time only. Dismissal letter mailed earlier this week.

## 2021-07-13 NOTE — Telephone Encounter (Signed)
Meds refilled to pharmacy for about 60 days. Dismissal letter mailed earlier this week.

## 2021-07-13 NOTE — Addendum Note (Signed)
Addended by: Mady Haagensen on: 07/13/2021 04:08 PM   Modules accepted: Orders

## 2021-08-03 ENCOUNTER — Other Ambulatory Visit: Payer: Self-pay | Admitting: *Deleted

## 2021-08-03 NOTE — Patient Outreach (Signed)
[facility]) Care Management  08/03/2021  [name] 26-Nov-1941 [id number]   Telephone Assessment-Unsuccessful  RN attempted outreach call today however unsuccessful. RN able to leave a HIPAA approved voice message requesting a call back.  Will attempted another outreach call over the next week for ongoing [facility] services.  [name], RN Care Management Coordinator [place] Office [phone]

## 2021-08-08 ENCOUNTER — Other Ambulatory Visit: Payer: Self-pay | Admitting: *Deleted

## 2021-08-08 NOTE — Patient Outreach (Signed)
[place] [facility]) Care Management  08/08/2021  [name] [birth year] [id number]  Telephone Assessment-Unsuccessful  RN attempted outreach letter however pt not available. RN able to leave a HIPAA approved voice message requesting a call back.  Will scheduled another outreach call over the next week for ongoing [facility] services. Will also send outreach letter.  [name], RN Care Management Coordinator [place] Office [phone]

## 2021-08-14 ENCOUNTER — Other Ambulatory Visit: Payer: Self-pay | Admitting: *Deleted

## 2021-08-14 NOTE — Patient Outreach (Signed)
[place] [facility]) Care Management  08/14/2021  [name] January 17, 1941 [id number]   Telephone Assessment-Successful  RN spoke with pt who continues to do well in managing her ongoing care. Denies any issues with memory as pt continue to drive herself to all her ongoing medical appointments without getting lose. Based upon discussed the current plan of care pt continue to have supportive family and neighbors if needed. No scute needs or resources needed on this call today as pt doing well enough to lace on quarterly follow up calls.  Will continue to follow up and communicate with the provider on pt's participation and disposition with [facility] services.   Goals Addressed             This Visit's Progress    THN-Protect My Health   On track    Follow Up Date 11/12/2021 QUARTERLY FOLLOW UPS Timeframe:  Long-Range Goal Priority:  Medium Start Date:  12/22/2020                           Expected End Date:    11/14/2021                   Barriers: Health Behaviors    - schedule recommended health tests (blood work, mammogram, colonoscopy, pap test) - schedule and keep appointment for annual check-up    Why is this important?   Screening tests can find diseases early when they are easier to treat.  Your doctor or nurse will talk with you about which tests are important for you.  Getting shots for common diseases like the flu and shingles will help prevent them.     Notes:  8/30-Pt doing well with no acute issues or needs at this time. Pt states she is spending time with her grand-nieces and her neighbors check on her from time to time to makes sure she is doing okay. Pt keeping all her appointments as she continue to drive without getting loss in route (history). Plan of care discussed as pt continue to do well. Will place on quarterly outreach calls due to pt's absence of falls and low risk of safety factors.   4/21-Pt reports she continue to do well with no additional  episodes of being lost driving and able to get to all her medical appointments, get her medications and run all errands with no reported problems. Feb- pt continue to drive only during the day. Continue to await [facility] to consult.Will continue to encouraged adherence to the plan of care. Pt well prepared for stormy weather with plenty food.  Jan-offered options for transport services and/or other resources for possible home visit ([facility]) was decided. Will make the proper referral on Monday during business hours.          [name], RN Care Management Coordinator [place] Office [phone]

## 2021-09-07 ENCOUNTER — Encounter: Payer: Self-pay | Admitting: *Deleted

## 2021-09-18 ENCOUNTER — Encounter: Payer: Self-pay | Admitting: *Deleted

## 2021-09-18 NOTE — Patient Outreach (Signed)
[facility]) Care Management  09/18/2021  [name] 22-May-1941 [id number]   Care Coordination  RN spoke with pt concerning establishment of her new primary provider.  Pt recent changed providers. Pt indicated she has been established with [facility] (Dr. Ashby Dawes).  Will sent letter to provider on pt's disposition with [facility] services for care management services.  [name], RN Care Management Coordinator [place] Office [phone]

## 2021-11-12 ENCOUNTER — Other Ambulatory Visit: Payer: Self-pay | Admitting: *Deleted

## 2021-11-12 NOTE — Patient Outreach (Signed)
[place] [facility]) Care Management  11/12/2021  [name] 10/06/1941 [id number]   Telephone Assessment-Unsuccessful  RN attempted outreach call today however unsuccessful and unable to leave a HIPAA approved voice message requesting a call back.  Will attempt another outreach call over the next few days.  [name], RN Care Management Coordinator [place] Office [phone]

## 2021-11-14 ENCOUNTER — Other Ambulatory Visit: Payer: Self-pay | Admitting: *Deleted

## 2021-11-14 NOTE — Patient Outreach (Signed)
[place] [facility]) Care Management  11/14/2021  [name] 28-Apr-1941 [id number]   Telephone Assessment-Unsuccessful  RN attempted outreach call today however unsuccessful. RN unable to leave a HIPAA approved voice message.   Will attempt another outreach call over the next week for ongoing [facility] services.  [name], RN Care Management Coordinator [place] Office [phone]

## 2021-11-20 ENCOUNTER — Other Ambulatory Visit: Payer: Self-pay | Admitting: *Deleted

## 2021-11-20 NOTE — Patient Outreach (Addendum)
[place] [facility]) Care Management  11/20/2021  [name] 1941/03/31 [id number]   Telephone Assessment Outreach #3  RN unable to reach pt at the available contacts and contacted pt's sister [name]). RN able to leave a message with contact number for pt to return a call to this RN case manager for ongoing care management services via [facility].  Awaiting call back however will reschedule another outreach call over the month for ongoing [facility] service.  [name], RN Care Management Coordinator [place] Office [phone]

## 2021-11-29 ENCOUNTER — Encounter: Payer: Self-pay | Admitting: *Deleted

## 2021-11-29 ENCOUNTER — Other Ambulatory Visit: Payer: Self-pay | Admitting: *Deleted

## 2021-11-29 NOTE — Patient Outreach (Signed)
[place] [facility]) Care Management  11/29/2021  JOCELYNNE DUQUETTE [birth year] [id number]  Telephone Assessment- Successful  RN was successful with outreach today and received an update on pt's concerning her ongoing management of care. RN updated the discussed plan and continue to encourage pt to adhere to all discussed. Pt remains aware of how to contact this RN case manager with any request or concerns.  Based upon pt's ongoing management of care will follow up quarterly. Will continue to communicate with pt's provider and follow up in March on pt's ongoing management of care.  Goals Addressed             This Visit's Progress    THN-Protect My Health   On track    Follow Up Date 02/28/2022 QUARTERLY FOLLOW UPS Timeframe:  Long-Range Goal Priority:  Medium Start Date:  12/22/2020                           Expected End Date:    03/15/2022                  Barriers: Health Behaviors    - schedule recommended health tests (blood work, mammogram, colonoscopy, pap test) - schedule and keep appointment for annual check-up    Why is this important?   Screening tests can find diseases early when they are easier to treat.  Your doctor or nurse will talk with you about which tests are important for you.  Getting shots for common diseases like the flu and shingles will help prevent them.     Notes:  12/15-Pt states she continues to avoid crowded places and stays indoors to avoid getting sick. Pt has a very supportive family to obtains her groceries and provides ongoing transportation to all her medical appointments. Will continue to encouraged adherence to the discussed plan of care. 8/30-Pt doing well with no acute issues or needs at this time. Pt states she is spending time with her grand-nieces and her neighbors check on her from time to time to makes sure she is doing okay. Pt keeping all her appointments as she continue to drive without getting loss in route (history). Plan of  care discussed as pt continue to do well. Will place on quarterly outreach calls due to pt's absence of falls and low risk of safety factors.   4/21-Pt reports she continue to do well with no additional episodes of being lost driving and able to get to all her medical appointments, get her medications and run all errands with no reported problems. Feb- pt continue to drive only during the day. Continue to await [facility] to consult.Will continue to encouraged adherence to the plan of care. Pt well prepared for stormy weather with plenty food.  Jan-offered options for transport services and/or other resources for possible home visit ([facility]) was decided. Will make the proper referral on Monday during business hours.          [name], RN Care Management Coordinator [place] Office [phone]

## 2021-11-29 NOTE — Telephone Encounter (Signed)
This encounter was created in error - please disregard.

## 2021-12-19 ENCOUNTER — Ambulatory Visit: Payer: Self-pay | Admitting: *Deleted

## 2021-12-26 ENCOUNTER — Other Ambulatory Visit: Payer: Self-pay

## 2021-12-26 ENCOUNTER — Encounter (HOSPITAL_COMMUNITY): Payer: Self-pay

## 2021-12-26 ENCOUNTER — Emergency Department (HOSPITAL_COMMUNITY): Payer: Medicare HMO

## 2021-12-26 ENCOUNTER — Emergency Department (HOSPITAL_COMMUNITY)
Admission: EM | Admit: 2021-12-26 | Discharge: 2021-12-27 | Disposition: A | Discharge status: Home / Self Care | Payer: Medicare HMO | Attending: Emergency Medicine | Admitting: Emergency Medicine

## 2021-12-26 DIAGNOSIS — R42 Dizziness and giddiness: Secondary | ICD-10-CM | POA: Insufficient documentation

## 2021-12-26 DIAGNOSIS — Z20822 Contact with and (suspected) exposure to covid-19: Secondary | ICD-10-CM | POA: Diagnosis not present

## 2021-12-26 DIAGNOSIS — Z79899 Other long term (current) drug therapy: Secondary | ICD-10-CM | POA: Insufficient documentation

## 2021-12-26 DIAGNOSIS — R531 Weakness: Secondary | ICD-10-CM | POA: Insufficient documentation

## 2021-12-26 LAB — CBC WITH DIFFERENTIAL/PLATELET
Abs Immature Granulocytes: 0.13 10*3/uL — ABNORMAL HIGH (ref 0.00–0.07)
Basophils Absolute: 0 10*3/uL (ref 0.0–0.1)
Basophils Relative: 0 %
Eosinophils Absolute: 0.1 10*3/uL (ref 0.0–0.5)
Eosinophils Relative: 0 %
HCT: 43.4 % (ref 36.0–46.0)
Hemoglobin: 14.6 g/dL (ref 12.0–15.0)
Immature Granulocytes: 1 %
Lymphocytes Relative: 9 %
Lymphs Abs: 1.2 10*3/uL (ref 0.7–4.0)
MCH: 29.5 pg (ref 26.0–34.0)
MCHC: 33.6 g/dL (ref 30.0–36.0)
MCV: 87.7 fL (ref 80.0–100.0)
Monocytes Absolute: 1 10*3/uL (ref 0.1–1.0)
Monocytes Relative: 8 %
Neutro Abs: 11.1 10*3/uL — ABNORMAL HIGH (ref 1.7–7.7)
Neutrophils Relative %: 82 %
Platelets: 178 10*3/uL (ref 150–400)
RBC: 4.95 MIL/uL (ref 3.87–5.11)
RDW: 12.2 % (ref 11.5–15.5)
WBC: 13.6 10*3/uL — ABNORMAL HIGH (ref 4.0–10.5)
nRBC: 0 % (ref 0.0–0.2)

## 2021-12-26 LAB — COMPREHENSIVE METABOLIC PANEL
ALT: 13 U/L (ref 0–44)
AST: 14 U/L — ABNORMAL LOW (ref 15–41)
Albumin: 3.2 g/dL — ABNORMAL LOW (ref 3.5–5.0)
Alkaline Phosphatase: 64 U/L (ref 38–126)
Anion gap: 15 (ref 5–15)
BUN: 35 mg/dL — ABNORMAL HIGH (ref 8–23)
CO2: 21 mmol/L — ABNORMAL LOW (ref 22–32)
Calcium: 9.6 mg/dL (ref 8.9–10.3)
Chloride: 101 mmol/L (ref 98–111)
Creatinine, Ser: 1.61 mg/dL — ABNORMAL HIGH (ref 0.44–1.00)
GFR, Estimated: 32 mL/min — ABNORMAL LOW (ref >60)
Glucose, Bld: 104 mg/dL — ABNORMAL HIGH (ref 70–99)
Potassium: 3 mmol/L — ABNORMAL LOW (ref 3.5–5.1)
Sodium: 137 mmol/L (ref 135–145)
Total Bilirubin: 1.2 mg/dL (ref 0.3–1.2)
Total Protein: 6.3 g/dL — ABNORMAL LOW (ref 6.5–8.1)

## 2021-12-26 LAB — RESP PANEL BY RT-PCR (FLU A&B, COVID) ARPGX2
Influenza A by PCR: NEGATIVE
Influenza B by PCR: NEGATIVE
SARS Coronavirus 2 by RT PCR: NEGATIVE

## 2021-12-26 LAB — ETHANOL: Alcohol, Ethyl (B): <10 mg/dL (ref <10)

## 2021-12-26 LAB — LACTIC ACID, PLASMA: Lactic Acid, Venous: 1.3 mmol/L (ref 0.5–1.9)

## 2021-12-26 MED ORDER — POTASSIUM CHLORIDE IN NACL 20-0.9 MEQ/L-% IV SOLN
INTRAVENOUS | Status: DC
  Filled 2021-12-26: qty 1000

## 2021-12-26 MED ORDER — POTASSIUM CHLORIDE IN NACL 20-0.9 MEQ/L-% IV SOLN
Freq: Once | INTRAVENOUS | Status: AC
  Filled 2021-12-26 (×2): qty 1000

## 2021-12-26 MED ORDER — SODIUM CHLORIDE 0.9 % IV BOLUS
1000.0000 mL | Freq: Once | INTRAVENOUS | Status: AC
  Administered 2021-12-26: 1000 mL via INTRAVENOUS

## 2021-12-26 MED ORDER — ACETAMINOPHEN 500 MG PO TABS
1000.0000 mg | ORAL_TABLET | Freq: Once | ORAL | Status: AC
Start: 2021-12-27 — End: 2021-12-27
  Administered 2021-12-27: 1000 mg via ORAL
  Filled 2021-12-26: qty 2

## 2021-12-26 MED ORDER — POTASSIUM CHLORIDE IN NACL 20-0.9 MEQ/L-% IV SOLN: Freq: Once | INTRAVENOUS | Status: DC

## 2021-12-26 NOTE — ED Triage Notes (Signed)
Pt bib ems from home c/o dizziness and collapsed. Ems stated pt sitting BP 104/78 and while standing systolic 64. Pt was given 500 normal saline bolus. Pt states she lost her bother two weeks ago. She has not been eating or drinking well d/t grieving.  HR 77 CBG 95 RA 98%

## 2021-12-26 NOTE — ED Notes (Signed)
Patient transported to X-ray 

## 2021-12-26 NOTE — ED Provider Notes (Signed)
[facility] EMERGENCY DEPARTMENT Provider Note   CSN: [id number] Arrival date & time: 12/26/21  1620     History  Chief Complaint  Patient presents with   Dizziness    [name] is a 81 y.o. female.  HPI Patient presents via EMS after an episode of weakness, no fall.  History is obtained by the patient and EMS providers.  Apparently friends went to check on the patient today, and while going to the door she felt particularly weak, probably not focally.  No pain, either then or now.  EMS reports the patient was hypotensive, 104/78 with a decrease to 64 systolic when upright.  She received 500 mL of fluid in route, had improvement.  Currently patient denies pain, focal weakness, dyspnea.  Notably, the patient's brother died a few weeks ago, and she acknowledges ongoing grieving.    Home Medications Prior to Admission medications   Medication Sig Start Date End Date Taking? Authorizing Provider  albuterol (PROVENTIL HFA;VENTOLIN HFA) 108 (90 Base) MCG/ACT inhaler Inhale 2 puffs into the lungs every 6 (six) hours as needed for wheezing or shortness of breath. 03/28/17   Elby Showers, MD  amLODipine (NORVASC) 5 MG tablet Take 1 tablet (5 mg total) by mouth daily. 07/13/21   Elby Showers, MD  carvedilol (COREG) 12.5 MG tablet Take 1 tablet (12.5 mg total) by mouth 2 (two) times daily with a meal. 07/13/21   Baxley, Cresenciano Lick, MD  cholecalciferol (VITAMIN D) 1000 UNITS tablet Take 1,000 Units by mouth daily.    [provider]  hydrochlorothiazide (HYDRODIURIL) 25 MG tablet Take 1 tablet (25 mg total) by mouth daily. 07/13/21   Elby Showers, MD  levothyroxine (SYNTHROID) 50 MCG tablet TAKE 1 TABLET BY MOUTH DAILY ON EMPTY STOMACH WITHOUT FOOD OR OTHER MEDS *FOLLOW UP IN 6 WEEKS* 07/13/21   Elby Showers, MD  sertraline (ZOLOFT) 50 MG tablet Take 1 tablet (50 mg total) by mouth daily. 07/13/21   Elby Showers, MD      Allergies    Codeine    Review of Systems    Review of Systems  Constitutional:        Per HPI, otherwise negative  HENT:         Per HPI, otherwise negative  Respiratory:         Per HPI, otherwise negative  Cardiovascular:        Per HPI, otherwise negative  Gastrointestinal:  Negative for vomiting.  Endocrine:       Negative aside from HPI  Genitourinary:        Neg aside from HPI   Musculoskeletal:        Per HPI, otherwise negative  Skin: Negative.   Neurological:  Positive for weakness. Negative for syncope.   Physical Exam Updated Vital Signs BP (!) 168/77    Pulse 65    Temp 97.8 F (36.6 C) (Oral)    Resp 19    Ht 5\' 4"  (1.626 m)    Wt 78.9 kg    SpO2 99%    BMI 29.87 kg/m  Physical Exam Vitals and nursing note reviewed.  Constitutional:      General: She is not in acute distress.    Appearance: She is well-developed.  HENT:     Head: Normocephalic and atraumatic.  Eyes:     Conjunctiva/sclera: Conjunctivae normal.  Cardiovascular:     Rate and Rhythm: Normal rate and regular rhythm.  Pulmonary:  Effort: Pulmonary effort is normal. No respiratory distress.     Breath sounds: Normal breath sounds. No stridor.  Abdominal:     General: There is no distension.  Skin:    General: Skin is warm and dry.  Neurological:     Mental Status: She is alert and oriented to person, place, and time.     Cranial Nerves: No cranial nerve deficit.    ED Results / Procedures / Treatments   Labs (all labs ordered are listed, but only abnormal results are displayed) Labs Reviewed  COMPREHENSIVE METABOLIC PANEL - Abnormal; Notable for the following components:      Result Value   Potassium 3.0 (*)    CO2 21 (*)    Glucose, Bld 104 (*)    BUN 35 (*)    Creatinine, Ser 1.61 (*)    Total Protein 6.3 (*)    Albumin 3.2 (*)    AST 14 (*)    GFR, Estimated 32 (*)    All other components within normal limits  CBC WITH DIFFERENTIAL/PLATELET - Abnormal; Notable for the following components:   WBC 13.6 (*)    Neutro  Abs 11.1 (*)    Abs Immature Granulocytes 0.13 (*)    All other components within normal limits  RESP PANEL BY RT-PCR (FLU A&B, COVID) ARPGX2  ETHANOL  LACTIC ACID, PLASMA  LACTIC ACID, PLASMA  URINALYSIS, ROUTINE W REFLEX MICROSCOPIC    EKG EKG Interpretation  Date/Time:  Wednesday December 26 2021 16:26:31 EST Ventricular Rate:  58 PR Interval:  152 QRS Duration: 106 QT Interval:  614 QTC Calculation: 604 R Axis:   -10 Text Interpretation: Sinus rhythm Nonspecific T abnrm, anterolateral leads Prolonged QT interval Abnormal ECG Confirmed by Carmin Muskrat [phone]) on 12/26/2021 4:28:26 PM  Radiology DG Chest 2 View  Result Date: 12/26/2021 CLINICAL DATA:  Weakness EXAM: CHEST - 2 VIEW COMPARISON:  05/15/2015 FINDINGS: Somewhat low lung volumes. Normal cardiac and mediastinal contours. Aortic atherosclerotic calcifications. No focal pulmonary opacity. No pleural effusion. No acute osseous abnormality. IMPRESSION: No active cardiopulmonary disease. Electronically Signed   By: Merilyn Baba M.D.   On: 12/26/2021 18:08    Procedures Procedures    Medications Ordered in ED Medications  acetaminophen (TYLENOL) tablet 1,000 mg (has no administration in time range)  sodium chloride 0.9 % bolus 1,000 mL (0 mLs Intravenous Stopped 12/26/21 1900)  0.9 % NaCl with KCl 20 mEq/ L  infusion (0 mL/hr Intravenous Stopped 12/26/21 2334)    ED Course/ Medical Decision Making/ A&P Cardiac monitor 60 sinus normal Pulse ox 100% room air normal   : Patient calm.  Initial findings concerning for hypokalemia, slight elevation in creatinine 1.6, BUN 35.  I reviewed her x-ray, no evidence for pneumonia.                         Medical Decision Making Adult female presents with weakness in the context of ongoing grief over a recently deceased brother.  Patient is awake alert, mentating appropriately.  She is afebrile.  Initial consideration including dehydration versus electrolyte antibiotics versus  infection less likely posttraumatic with no fall or trauma considered.  Patient's resuscitation included fluids, potassium repletion via IV, monitoring for hours and no decompensation, no substantial change in her condition, improvement in general.  No early evidence for bacteremia, sepsis, some suspicion for urinary tract infection.  This test was pending on signout and on chart review is clear this test was  negative.  Result was followed up by my colleague, with reassuring result patient discharged home. Final Clinical Impression(s) / ED Diagnoses Final diagnoses:  Patrick North, MD 12/27/21 2215

## 2021-12-26 NOTE — ED Notes (Signed)
Cici, daughter, [phone] would like an update when available

## 2021-12-27 DIAGNOSIS — Z7401 Bed confinement status: Secondary | ICD-10-CM | POA: Diagnosis not present

## 2021-12-27 DIAGNOSIS — M2550 Pain in unspecified joint: Secondary | ICD-10-CM | POA: Diagnosis not present

## 2021-12-27 LAB — URINALYSIS, ROUTINE W REFLEX MICROSCOPIC
Glucose, UA: NEGATIVE mg/dL
Hgb urine dipstick: NEGATIVE
Ketones, ur: 15 mg/dL — AB
Nitrite: NEGATIVE
Protein, ur: NEGATIVE mg/dL
Specific Gravity, Urine: 1.025 (ref 1.005–1.030)
pH: 5.5 (ref 5.0–8.0)

## 2021-12-27 LAB — URINALYSIS, MICROSCOPIC (REFLEX): Bacteria, UA: NONE SEEN

## 2021-12-27 NOTE — Discharge Instructions (Signed)
As we discussed, we believe her symptoms are caused today by mild volume depletion, or mild dehydration, without any evidence of damage to your body.  Please drink plenty of clear fluids such as water and/or electrolyte water and follow up with your regular doctor or the doctors listed in his documentation at the next available opportunity.  Return to the emergency department with any new or worsening symptoms that concern you, including but not limited to fever, shortness of breath, chest pain, or other concerning symptoms.

## 2021-12-27 NOTE — ED Provider Notes (Signed)
Blood pressure (!) 168/77, pulse 65, temperature 97.8 F (36.6 C), temperature source Oral, resp. rate 19, height 5\' 4"  (1.626 m), weight 78.9 kg, SpO2 99 %.  Assuming care from Dr. Vanita Panda.  In short, [name] is a 81 y.o. female with a chief complaint of Dizziness .  Refer to the original H&P for additional details.  The current plan of care is to f/u on UA.  UA without infection. Mild hypokalemia. Plan for replacement at home and PCP follow up.     Margette Fast, MD 12/27/21 0040

## 2022-01-31 ENCOUNTER — Ambulatory Visit: Payer: 59 | Admitting: *Deleted

## 2022-02-11 ENCOUNTER — Encounter: Payer: Self-pay | Admitting: *Deleted

## 2022-02-28 ENCOUNTER — Other Ambulatory Visit: Payer: Self-pay | Admitting: *Deleted

## 2022-02-28 NOTE — Patient Instructions (Signed)
Visit Information  Thank you for taking time to visit with me today. Please don't hesitate to contact me if I can be of assistance to you before our next scheduled telephone appointment.    

## 2022-02-28 NOTE — Patient Outreach (Signed)
?[facility]) Care Management ?Telephonic RN Care Manager Note ? ? ?02/28/2022 ?Name:  [name] MRN:  [id number] DOB:  01-31-1941 ? ?Summary: ?Verified pt continue to do well with no acute injuries or falls over the last month.  ? ?Recommendations/Changes made from today's visit: ?Will continue to encouraged adherence to the ongoing discussed plan of care and continue to follow up quarterly as requested. ? ?Subjective: ?[name] is an 81 y.o. year old female who is a primary patient of Sierra Seashore, MD. The care management team was consulted for assistance with care management and/or care coordination needs.   ? ?Telephonic RN Care Manager completed Telephone Visit today. ? ?Objective:  ? ?Medications Reviewed Today   ? ? Reviewed by Elyn Peers, CPhT (Pharmacy Technician) on 12/26/21 at 1650  Med List Status: <None>  ? ?Medication Order Taking? Sig Documenting Provider Last Dose Status Informant  ?albuterol (PROVENTIL HFA;VENTOLIN HFA) 108 (90 Base) MCG/ACT inhaler [id number]  Inhale 2 puffs into the lungs every 6 (six) hours as needed for wheezing or shortness of breath. Elby Showers, MD  Active   ?amLODipine (NORVASC) 5 MG tablet [id number]  Take 1 tablet (5 mg total) by mouth daily. Elby Showers, MD  Active   ?carvedilol (COREG) 12.5 MG tablet [id number]  Take 1 tablet (12.5 mg total) by mouth 2 (two) times daily with a meal. Baxley, Cresenciano Lick, MD  Active   ?cholecalciferol (VITAMIN D) 1000 UNITS tablet [id number]  Take 1,000 Units by mouth daily. [provider]  Active Self  ?hydrochlorothiazide (HYDRODIURIL) 25 MG tablet [id number]  Take 1 tablet (25 mg total) by mouth daily. Elby Showers, MD  Active   ?levothyroxine (SYNTHROID) 50 MCG tablet [id number]  TAKE 1 TABLET BY MOUTH DAILY ON EMPTY STOMACH WITHOUT FOOD OR OTHER MEDS *FOLLOW UP IN 6 WEEKS* Baxley, Cresenciano Lick, MD  Active   ?sertraline (ZOLOFT) 50 MG tablet [id number]  Take 1 tablet (50 mg total) by mouth daily.  Elby Showers, MD  Active   ? ?  ?  ? ?  ? ? ? ?SDOH:  (Social Determinants of Health) assessments and interventions performed:  ? ? ? ?Care Plan ? ?Review of patient past medical history, allergies, medications, health status, including review of consultants reports, laboratory and other test data, was performed as part of comprehensive evaluation for care management services.  ? ?Care Plan : RN Care Manager Plan of Care  ?Updates made by Tobi Bastos, RN since 02/28/2022 12:00 AM  ?  ? ?Problem: Knowledge Deficit related to management on safety/fall prevention and care coordination needs   ?Priority: High  ?  ? ?Long-Range Goal: Development of plan of care for management of Fall prevention and safety measures   ?Start Date: 02/28/2022  ?Expected End Date: 08/15/2022  ?Priority: High  ?Note:   ?Current Barriers:  ?Knowledge Deficits related to plan of care for management of Fall prevention  ? ?RNCM Clinical Goal(s):  ?Patient will verbalize basic understanding of  Fall prevention disease process and self health management plan as evidenced by self reporting and chart reveiw  through collaboration with RN Care manager, provider, and care team.  ? ?Interventions: ?Inter-disciplinary care team collaboration (see longitudinal plan of care) ?Evaluation of current treatment plan related to  self management and patient's adherence to plan as established by provider ? ? ?Falls Interventions:  (Status:  New goal.) Long Term Goal ?Provided written and verbal education re: potential causes of  falls and Fall prevention strategies ?Reviewed medications and discussed potential side effects of medications such as dizziness and frequent urination ?Advised patient of importance of notifying provider of falls ?Assessed for signs and symptoms of orthostatic hypotension ?Assessed patients knowledge of fall risk prevention secondary to previously provided education ?Screening for signs and symptoms of depression related to chronic  disease state  ? ?Patient Goals/Self-Care Activities: ?Take all medications as prescribed ?Attend all scheduled provider appointments ?Call pharmacy for medication refills 3-7 days in advance of running out of medications ?Attend church or other social activities ?Perform all self care activities independently  ?Perform IADL's (shopping, preparing meals, housekeeping, managing finances) independently ?Call provider office for new concerns or questions  ? ?Follow Up Plan:  Telephone follow up appointment with care management team member scheduled for:  June 2023 ?The patient has been provided with contact information for the care management team and has been advised to call with any health related questions or concerns.   ?  ? ? ? ?[name], RN ?Care Management Coordinator ?[place] ?Main Office [phone]  ? ? ?

## 2022-05-21 ENCOUNTER — Other Ambulatory Visit: Payer: Self-pay | Admitting: Internal Medicine

## 2022-05-24 ENCOUNTER — Other Ambulatory Visit: Payer: Self-pay | Admitting: *Deleted

## 2022-05-24 NOTE — Patient Outreach (Signed)
[facility]) Care Management Telephonic RN Care Manager Note   05/24/2022 Name:  [name] MRN:  [id number] DOB:  05-Sep-1941  Summary: All goal met with no additional needs or issues to address at this time. Pt aware to follow-up with her provider's office for any other interventions  Recommendations/Changes made from today's visit: Will communicate with the provider on pt's disposition with [facility] services. Case will be closed.  Subjective: [name] is an 81 y.o. year old female who is a primary patient of Merrilee Seashore, MD. The care management team was consulted for assistance with care management and/or care coordination needs.    Telephonic RN Care Manager completed Telephone Visit today.  Objective:   Medications Reviewed Today     Reviewed by Elyn Peers, CPhT (Pharmacy Technician) on 12/26/21 at 1650  Med List Status: <None>   Medication Order Taking? Sig Documenting Provider Last Dose Status Informant  albuterol (PROVENTIL HFA;VENTOLIN HFA) 108 (90 Base) MCG/ACT inhaler [id number]  Inhale 2 puffs into the lungs every 6 (six) hours as needed for wheezing or shortness of breath. Elby Showers, MD  Active   amLODipine (NORVASC) 5 MG tablet [id number]  Take 1 tablet (5 mg total) by mouth daily. Elby Showers, MD  Active   carvedilol (COREG) 12.5 MG tablet [id number]  Take 1 tablet (12.5 mg total) by mouth 2 (two) times daily with a meal. Elby Showers, MD  Active   cholecalciferol (VITAMIN D) 1000 UNITS tablet [id number]  Take 1,000 Units by mouth daily. [provider]  Active Self  hydrochlorothiazide (HYDRODIURIL) 25 MG tablet [id number]  Take 1 tablet (25 mg total) by mouth daily. Elby Showers, MD  Active   levothyroxine (SYNTHROID) 50 MCG tablet [id number]  TAKE 1 TABLET BY MOUTH DAILY ON EMPTY STOMACH WITHOUT FOOD OR OTHER MEDS *FOLLOW UP IN 6 WEEKS* Baxley, Cresenciano Lick, MD  Active   sertraline (ZOLOFT) 50 MG tablet [id number]  Take 1  tablet (50 mg total) by mouth daily. Elby Showers, MD  Active              SDOH:  (Social Determinants of Health) assessments and interventions performed:     Care Plan  Review of patient past medical history, allergies, medications, health status, including review of consultants reports, laboratory and other test data, was performed as part of comprehensive evaluation for care management services.   Care Plan : RN Care Manager Plan of Care  Updates made by Tobi Bastos, RN since 05/24/2022 12:00 AM     Problem: Knowledge Deficit related to management on safety/fall prevention and care coordination needs   Priority: High     Long-Range Goal: Development of plan of care for management of Fall prevention and safety measures Completed 05/24/2022  Start Date: 02/28/2022  Expected End Date: 08/15/2022  This Visit's Progress: On track  Priority: High  Note:   Current Barriers:  Knowledge Deficits related to plan of care for management of Fall prevention   RNCM Clinical Goal(s):  Patient will verbalize basic understanding of  Fall prevention disease process and self health management plan as evidenced by self reporting and chart reveiw  through collaboration with RN Care manager, provider, and care team.   Interventions: Inter-disciplinary care team collaboration (see longitudinal plan of care) Evaluation of current treatment plan related to  self management and patient's adherence to plan as established by provider   Falls Interventions:  (Status:  Goal on track:  Yes.) Long Term Goal Provided written and verbal education re: potential causes of falls and Fall prevention strategies Reviewed medications and discussed potential side effects of medications such as dizziness and frequent urination Advised patient of importance of notifying provider of falls Assessed for signs and symptoms of orthostatic hypotension Assessed patients knowledge of fall risk prevention secondary to  previously provided education Screening for signs and symptoms of depression related to chronic disease state    Patient Goals/Self-Care Activities: Take all medications as prescribed Attend all scheduled provider appointments Call pharmacy for medication refills 3-7 days in advance of running out of medications Attend church or other social activities Perform all self care activities independently  Perform IADL's (shopping, preparing meals, housekeeping, managing finances) independently Call provider office for new concerns or questions   Follow Up Plan:  Telephone follow up appointment with care management team member scheduled for:  June 2023 The patient has been provided with contact information for the care management team and has been advised to call with any health related questions or concerns.    All goals met case will be closed and pt aware to contact providers office in the future for any additional interventions. ( Upstream available if needed).     [name], RN Care Management Coordinator [place] Office [phone]

## 2022-06-05 ENCOUNTER — Other Ambulatory Visit: Payer: Self-pay

## 2022-06-05 ENCOUNTER — Encounter (HOSPITAL_COMMUNITY): Payer: Self-pay

## 2022-06-05 ENCOUNTER — Inpatient Hospital Stay (HOSPITAL_COMMUNITY)
Admission: EM | Admit: 2022-06-05 | Discharge: 2022-06-11 | DRG: 312 | Disposition: A | Discharge status: Skilled Nursing Facility | Payer: Medicare HMO | Attending: Internal Medicine | Admitting: Internal Medicine

## 2022-06-05 ENCOUNTER — Emergency Department (HOSPITAL_COMMUNITY): Payer: Medicare HMO

## 2022-06-05 DIAGNOSIS — M545 Low back pain, unspecified: Secondary | ICD-10-CM | POA: Diagnosis not present

## 2022-06-05 DIAGNOSIS — R1312 Dysphagia, oropharyngeal phase: Secondary | ICD-10-CM | POA: Diagnosis not present

## 2022-06-05 DIAGNOSIS — Z981 Arthrodesis status: Secondary | ICD-10-CM

## 2022-06-05 DIAGNOSIS — M7732 Calcaneal spur, left foot: Secondary | ICD-10-CM | POA: Diagnosis not present

## 2022-06-05 DIAGNOSIS — R41841 Cognitive communication deficit: Secondary | ICD-10-CM | POA: Diagnosis not present

## 2022-06-05 DIAGNOSIS — M6281 Muscle weakness (generalized): Secondary | ICD-10-CM | POA: Diagnosis not present

## 2022-06-05 DIAGNOSIS — Z87891 Personal history of nicotine dependence: Secondary | ICD-10-CM | POA: Diagnosis not present

## 2022-06-05 DIAGNOSIS — I129 Hypertensive chronic kidney disease with stage 1 through stage 4 chronic kidney disease, or unspecified chronic kidney disease: Secondary | ICD-10-CM | POA: Diagnosis present

## 2022-06-05 DIAGNOSIS — M1990 Unspecified osteoarthritis, unspecified site: Secondary | ICD-10-CM | POA: Diagnosis present

## 2022-06-05 DIAGNOSIS — F05 Delirium due to known physiological condition: Secondary | ICD-10-CM | POA: Diagnosis not present

## 2022-06-05 DIAGNOSIS — F419 Anxiety disorder, unspecified: Secondary | ICD-10-CM | POA: Diagnosis present

## 2022-06-05 DIAGNOSIS — M109 Gout, unspecified: Secondary | ICD-10-CM | POA: Diagnosis present

## 2022-06-05 DIAGNOSIS — M19072 Primary osteoarthritis, left ankle and foot: Secondary | ICD-10-CM | POA: Diagnosis not present

## 2022-06-05 DIAGNOSIS — E538 Deficiency of other specified B group vitamins: Secondary | ICD-10-CM | POA: Diagnosis not present

## 2022-06-05 DIAGNOSIS — Z9049 Acquired absence of other specified parts of digestive tract: Secondary | ICD-10-CM

## 2022-06-05 DIAGNOSIS — R9431 Abnormal electrocardiogram [ECG] [EKG]: Secondary | ICD-10-CM | POA: Diagnosis not present

## 2022-06-05 DIAGNOSIS — F32A Depression, unspecified: Secondary | ICD-10-CM | POA: Diagnosis not present

## 2022-06-05 DIAGNOSIS — E785 Hyperlipidemia, unspecified: Secondary | ICD-10-CM | POA: Diagnosis present

## 2022-06-05 DIAGNOSIS — I10 Essential (primary) hypertension: Secondary | ICD-10-CM | POA: Diagnosis present

## 2022-06-05 DIAGNOSIS — R5383 Other fatigue: Secondary | ICD-10-CM | POA: Diagnosis not present

## 2022-06-05 DIAGNOSIS — Z96643 Presence of artificial hip joint, bilateral: Secondary | ICD-10-CM | POA: Diagnosis present

## 2022-06-05 DIAGNOSIS — E039 Hypothyroidism, unspecified: Secondary | ICD-10-CM

## 2022-06-05 DIAGNOSIS — R2681 Unsteadiness on feet: Secondary | ICD-10-CM | POA: Diagnosis not present

## 2022-06-05 DIAGNOSIS — R55 Syncope and collapse: Principal | ICD-10-CM | POA: Diagnosis present

## 2022-06-05 DIAGNOSIS — Z885 Allergy status to narcotic agent status: Secondary | ICD-10-CM | POA: Diagnosis not present

## 2022-06-05 DIAGNOSIS — Z8249 Family history of ischemic heart disease and other diseases of the circulatory system: Secondary | ICD-10-CM | POA: Diagnosis not present

## 2022-06-05 DIAGNOSIS — I081 Rheumatic disorders of both mitral and tricuspid valves: Secondary | ICD-10-CM | POA: Diagnosis not present

## 2022-06-05 DIAGNOSIS — Z79899 Other long term (current) drug therapy: Secondary | ICD-10-CM | POA: Diagnosis not present

## 2022-06-05 DIAGNOSIS — S99922A Unspecified injury of left foot, initial encounter: Secondary | ICD-10-CM | POA: Diagnosis not present

## 2022-06-05 DIAGNOSIS — R5381 Other malaise: Secondary | ICD-10-CM | POA: Diagnosis not present

## 2022-06-05 DIAGNOSIS — N1832 Chronic kidney disease, stage 3b: Secondary | ICD-10-CM | POA: Diagnosis present

## 2022-06-05 DIAGNOSIS — R410 Disorientation, unspecified: Secondary | ICD-10-CM | POA: Diagnosis not present

## 2022-06-05 DIAGNOSIS — D72829 Elevated white blood cell count, unspecified: Secondary | ICD-10-CM | POA: Diagnosis not present

## 2022-06-05 DIAGNOSIS — R4182 Altered mental status, unspecified: Secondary | ICD-10-CM | POA: Diagnosis not present

## 2022-06-05 DIAGNOSIS — E876 Hypokalemia: Secondary | ICD-10-CM | POA: Diagnosis not present

## 2022-06-05 DIAGNOSIS — Z79890 Hormone replacement therapy (postmenopausal): Secondary | ICD-10-CM

## 2022-06-05 DIAGNOSIS — I469 Cardiac arrest, cause unspecified: Secondary | ICD-10-CM | POA: Diagnosis not present

## 2022-06-05 DIAGNOSIS — Z7401 Bed confinement status: Secondary | ICD-10-CM | POA: Diagnosis not present

## 2022-06-05 LAB — COMPREHENSIVE METABOLIC PANEL
ALT: 17 U/L (ref 0–44)
AST: 17 U/L (ref 15–41)
Albumin: 3.7 g/dL (ref 3.5–5.0)
Alkaline Phosphatase: 62 U/L (ref 38–126)
Anion gap: 10 (ref 5–15)
BUN: 21 mg/dL (ref 8–23)
CO2: 25 mmol/L (ref 22–32)
Calcium: 9.9 mg/dL (ref 8.9–10.3)
Chloride: 106 mmol/L (ref 98–111)
Creatinine, Ser: 1.47 mg/dL — ABNORMAL HIGH (ref 0.44–1.00)
GFR, Estimated: 36 mL/min — ABNORMAL LOW (ref >60)
Glucose, Bld: 112 mg/dL — ABNORMAL HIGH (ref 70–99)
Potassium: 3.1 mmol/L — ABNORMAL LOW (ref 3.5–5.1)
Sodium: 141 mmol/L (ref 135–145)
Total Bilirubin: 0.7 mg/dL (ref 0.3–1.2)
Total Protein: 7 g/dL (ref 6.5–8.1)

## 2022-06-05 LAB — CBC WITH DIFFERENTIAL/PLATELET
Abs Immature Granulocytes: 0.11 10*3/uL — ABNORMAL HIGH (ref 0.00–0.07)
Basophils Absolute: 0 10*3/uL (ref 0.0–0.1)
Basophils Relative: 0 %
Eosinophils Absolute: 0 10*3/uL (ref 0.0–0.5)
Eosinophils Relative: 0 %
HCT: 42.8 % (ref 36.0–46.0)
Hemoglobin: 14.4 g/dL (ref 12.0–15.0)
Immature Granulocytes: 1 %
Lymphocytes Relative: 13 %
Lymphs Abs: 1.6 10*3/uL (ref 0.7–4.0)
MCH: 29.5 pg (ref 26.0–34.0)
MCHC: 33.6 g/dL (ref 30.0–36.0)
MCV: 87.7 fL (ref 80.0–100.0)
Monocytes Absolute: 0.9 10*3/uL (ref 0.1–1.0)
Monocytes Relative: 8 %
Neutro Abs: 9.4 10*3/uL — ABNORMAL HIGH (ref 1.7–7.7)
Neutrophils Relative %: 78 %
Platelets: 195 10*3/uL (ref 150–400)
RBC: 4.88 MIL/uL (ref 3.87–5.11)
RDW: 13 % (ref 11.5–15.5)
WBC: 12.1 10*3/uL — ABNORMAL HIGH (ref 4.0–10.5)
nRBC: 0 % (ref 0.0–0.2)

## 2022-06-05 LAB — MAGNESIUM: Magnesium: 1.8 mg/dL (ref 1.7–2.4)

## 2022-06-05 MED ORDER — POTASSIUM CHLORIDE CRYS ER 20 MEQ PO TBCR
40.0000 meq | EXTENDED_RELEASE_TABLET | Freq: Once | ORAL | Status: AC
Start: 2022-06-05 — End: 2022-06-05
  Administered 2022-06-05: 40 meq via ORAL
  Filled 2022-06-05: qty 2

## 2022-06-05 MED ORDER — LACTATED RINGERS IV BOLUS
500.0000 mL | Freq: Once | INTRAVENOUS | Status: AC
  Administered 2022-06-05: 500 mL via INTRAVENOUS

## 2022-06-05 NOTE — ED Notes (Signed)
Pt unable to finish orthostatics due to pain

## 2022-06-05 NOTE — ED Triage Notes (Signed)
Pt c/o syncopal episode this afternoon. Pt reports she was standing, talking to her neighbor and the next thing she knew, her neighbor was standing over her. Pt also c/o fatigue and no energy for over a week.

## 2022-06-05 NOTE — ED Provider Notes (Signed)
[place] DEPT Provider Note   CSN: [id number] Arrival date & time: 06/05/22  1926     History  Chief Complaint  Patient presents with   Loss of Consciousness    [name] is a 81 y.o. female.  HPI 81 year old female presents with syncope.  She was talking to her neighbor and all of a sudden woke up.  Apparently her neighbor caught her as she passed out standing next to them.  She has been feeling fatigued with no energy for about a week or week and a half.  No new headaches, chest pain, shortness of breath, or abdominal pain.  No recent vomiting/diarrhea.  No focal weakness.  She denies any prodromal symptoms.  She had not just stood up.  No palpitations.  Home Medications Prior to Admission medications   Medication Sig Start Date End Date Taking? Authorizing Provider  amLODipine (NORVASC) 5 MG tablet Take 1 tablet (5 mg total) by mouth daily. 07/13/21  Yes Elby Showers, MD  buPROPion (WELLBUTRIN XL) 150 MG 24 hr tablet Take 150 mg by mouth every morning. 04/10/22  Yes [provider]  carvedilol (COREG) 12.5 MG tablet Take 1 tablet (12.5 mg total) by mouth 2 (two) times daily with a meal. 07/13/21  Yes Baxley, Cresenciano Lick, MD  levothyroxine (SYNTHROID) 50 MCG tablet TAKE 1 TABLET BY MOUTH DAILY ON EMPTY STOMACH WITHOUT FOOD OR OTHER MEDS *FOLLOW UP IN 6 WEEKS* Patient taking differently: Take 50 mcg by mouth See admin instructions. TAKE 1 TABLET BY MOUTH DAILY ON EMPTY STOMACH WITHOUT FOOD OR OTHER MEDS *FOLLOW UP IN 6 WEEKS* 07/13/21  Yes Baxley, Cresenciano Lick, MD  sertraline (ZOLOFT) 50 MG tablet Take 1 tablet (50 mg total) by mouth daily. 07/13/21  Yes Baxley, Cresenciano Lick, MD  albuterol (PROVENTIL HFA;VENTOLIN HFA) 108 (90 Base) MCG/ACT inhaler Inhale 2 puffs into the lungs every 6 (six) hours as needed for wheezing or shortness of breath. Patient not taking: Reported on 06/05/2022 03/28/17   Elby Showers, MD  hydrochlorothiazide (HYDRODIURIL) 25 MG tablet  Take 1 tablet (25 mg total) by mouth daily. 07/13/21   Elby Showers, MD      Allergies    Codeine    Review of Systems   Review of Systems  Constitutional:  Positive for fatigue. Negative for fever.  Respiratory:  Negative for shortness of breath.   Cardiovascular:  Negative for chest pain and palpitations.  Gastrointestinal:  Negative for abdominal pain, diarrhea and vomiting.  Musculoskeletal:  Positive for back pain (for about 6 weeks, since a fall - not new or worse).  Neurological:  Positive for headaches (chronic, unchanged).    Physical Exam Updated Vital Signs BP 120/88   Pulse 62   Temp 97.8 F (36.6 C) (Oral)   Resp 20   Ht '5\' 4"'$  (1.626 m)   Wt 77.1 kg   SpO2 96%   BMI 29.18 kg/m  Physical Exam Vitals and nursing note reviewed.  Constitutional:      Appearance: She is well-developed.  HENT:     Head: Normocephalic and atraumatic.  Eyes:     Extraocular Movements: Extraocular movements intact.     Pupils: Pupils are equal, round, and reactive to light.  Cardiovascular:     Rate and Rhythm: Normal rate and regular rhythm.     Heart sounds: Normal heart sounds.  Pulmonary:     Effort: Pulmonary effort is normal.     Breath sounds: Normal breath  sounds.  Abdominal:     Palpations: Abdomen is soft.     Tenderness: There is no abdominal tenderness.  Skin:    General: Skin is warm and dry.  Neurological:     Mental Status: She is alert.     Comments: CN 3-12 grossly intact. 5/5 strength in all 4 extremities. Grossly normal sensation. Normal finger to nose.      ED Results / Procedures / Treatments   Labs (all labs ordered are listed, but only abnormal results are displayed) Labs Reviewed  CBC WITH DIFFERENTIAL/PLATELET - Abnormal; Notable for the following components:      Result Value   WBC 12.1 (*)    Neutro Abs 9.4 (*)    Abs Immature Granulocytes 0.11 (*)    All other components within normal limits  COMPREHENSIVE METABOLIC PANEL - Abnormal;  Notable for the following components:   Potassium 3.1 (*)    Glucose, Bld 112 (*)    Creatinine, Ser 1.47 (*)    GFR, Estimated 36 (*)    All other components within normal limits  MAGNESIUM    EKG EKG Interpretation  Date/Time:  Wednesday June 05 2022 20:46:51 EDT Ventricular Rate:  116 PR Interval:  156 QRS Duration: 141 QT Interval:  457 QTC Calculation: 457 R Axis:   -78 Text Interpretation: likely sinus rhythm with artifact Nonspecific IVCD with LAD Abnormal T, consider ischemia, lateral leads Interpretation limited secondary to artifact Confirmed by Sherwood Gambler [phone]) on 06/05/2022 9:53:17 PM  Radiology DG Chest 2 View  Result Date: 06/05/2022 CLINICAL DATA:  Recent syncopal episode EXAM: CHEST - 2 VIEW COMPARISON:  12/26/2021 FINDINGS: Cardiac shadow is mildly prominent but accentuated by the frontal technique. Elevation of the right hemidiaphragm is again noted. No focal infiltrate or effusion is seen. No bony abnormality is noted. IMPRESSION: No acute abnormality noted. Electronically Signed   By: Inez Catalina M.D.   On: 06/05/2022 20:26    Procedures Procedures    Medications Ordered in ED Medications  lactated ringers bolus 500 mL (0 mLs Intravenous Stopped 06/05/22 2153)  potassium chloride SA (KLOR-CON M) CR tablet 40 mEq (40 mEq Oral Given 06/05/22 2157)    ED Course/ Medical Decision Making/ A&P                           Medical Decision Making Amount and/or Complexity of Data Reviewed External Data Reviewed: notes. Labs: ordered.    Details: Hypokalemia with potassium 3.1.  Mild leukocytosis of unclear etiology Radiology: ordered and independent interpretation performed.    Details: No pulmonary edema or pneumonia ECG/medicine tests: ordered and independent interpretation performed.    Details: QTc prolonged, though similar to January  Risk Prescription drug management. Decision regarding hospitalization.   Patient presents with what seems to be  unprovoked syncope.  Given her age as well as history of hypertension I think it be reasonable to admit for observation and syncope work-up.  She does have a prolonged QTc though this is unchanged from baseline.  We will start replete and her potassium.  No chest pain/shortness of breath/neuro symptoms.  Doubt ACS or PE.  Discussed with Dr. Marlowe Sax for admission        Final Clinical Impression(s) / ED Diagnoses Final diagnoses:  Syncope and collapse    Rx / DC Orders ED Discharge Orders     None         Sherwood Gambler, MD 06/05/22 2313

## 2022-06-06 ENCOUNTER — Observation Stay (HOSPITAL_COMMUNITY): Payer: Medicare HMO

## 2022-06-06 ENCOUNTER — Observation Stay (HOSPITAL_BASED_OUTPATIENT_CLINIC_OR_DEPARTMENT_OTHER): Payer: Medicare HMO

## 2022-06-06 DIAGNOSIS — M1990 Unspecified osteoarthritis, unspecified site: Secondary | ICD-10-CM | POA: Diagnosis present

## 2022-06-06 DIAGNOSIS — N1832 Chronic kidney disease, stage 3b: Secondary | ICD-10-CM | POA: Diagnosis present

## 2022-06-06 DIAGNOSIS — M109 Gout, unspecified: Secondary | ICD-10-CM | POA: Diagnosis present

## 2022-06-06 DIAGNOSIS — R9431 Abnormal electrocardiogram [ECG] [EKG]: Secondary | ICD-10-CM | POA: Diagnosis present

## 2022-06-06 DIAGNOSIS — Z79899 Other long term (current) drug therapy: Secondary | ICD-10-CM | POA: Diagnosis not present

## 2022-06-06 DIAGNOSIS — F05 Delirium due to known physiological condition: Secondary | ICD-10-CM | POA: Diagnosis not present

## 2022-06-06 DIAGNOSIS — Z8249 Family history of ischemic heart disease and other diseases of the circulatory system: Secondary | ICD-10-CM | POA: Diagnosis not present

## 2022-06-06 DIAGNOSIS — I081 Rheumatic disorders of both mitral and tricuspid valves: Secondary | ICD-10-CM | POA: Diagnosis present

## 2022-06-06 DIAGNOSIS — I129 Hypertensive chronic kidney disease with stage 1 through stage 4 chronic kidney disease, or unspecified chronic kidney disease: Secondary | ICD-10-CM | POA: Diagnosis present

## 2022-06-06 DIAGNOSIS — E039 Hypothyroidism, unspecified: Secondary | ICD-10-CM | POA: Diagnosis present

## 2022-06-06 DIAGNOSIS — R55 Syncope and collapse: Secondary | ICD-10-CM | POA: Diagnosis present

## 2022-06-06 DIAGNOSIS — M19072 Primary osteoarthritis, left ankle and foot: Secondary | ICD-10-CM | POA: Diagnosis not present

## 2022-06-06 DIAGNOSIS — E876 Hypokalemia: Secondary | ICD-10-CM | POA: Diagnosis present

## 2022-06-06 DIAGNOSIS — I10 Essential (primary) hypertension: Secondary | ICD-10-CM

## 2022-06-06 DIAGNOSIS — E538 Deficiency of other specified B group vitamins: Secondary | ICD-10-CM | POA: Diagnosis present

## 2022-06-06 DIAGNOSIS — F419 Anxiety disorder, unspecified: Secondary | ICD-10-CM | POA: Diagnosis present

## 2022-06-06 DIAGNOSIS — Z79890 Hormone replacement therapy (postmenopausal): Secondary | ICD-10-CM | POA: Diagnosis not present

## 2022-06-06 DIAGNOSIS — Z9049 Acquired absence of other specified parts of digestive tract: Secondary | ICD-10-CM | POA: Diagnosis not present

## 2022-06-06 DIAGNOSIS — S99922A Unspecified injury of left foot, initial encounter: Secondary | ICD-10-CM | POA: Diagnosis not present

## 2022-06-06 DIAGNOSIS — Z96643 Presence of artificial hip joint, bilateral: Secondary | ICD-10-CM | POA: Diagnosis present

## 2022-06-06 DIAGNOSIS — F32A Depression, unspecified: Secondary | ICD-10-CM | POA: Diagnosis present

## 2022-06-06 DIAGNOSIS — Z87891 Personal history of nicotine dependence: Secondary | ICD-10-CM | POA: Diagnosis not present

## 2022-06-06 DIAGNOSIS — D72829 Elevated white blood cell count, unspecified: Secondary | ICD-10-CM | POA: Diagnosis present

## 2022-06-06 DIAGNOSIS — E785 Hyperlipidemia, unspecified: Secondary | ICD-10-CM | POA: Diagnosis present

## 2022-06-06 DIAGNOSIS — M7732 Calcaneal spur, left foot: Secondary | ICD-10-CM | POA: Diagnosis not present

## 2022-06-06 DIAGNOSIS — M545 Low back pain, unspecified: Secondary | ICD-10-CM | POA: Diagnosis not present

## 2022-06-06 DIAGNOSIS — Z981 Arthrodesis status: Secondary | ICD-10-CM | POA: Diagnosis not present

## 2022-06-06 DIAGNOSIS — Z885 Allergy status to narcotic agent status: Secondary | ICD-10-CM | POA: Diagnosis not present

## 2022-06-06 LAB — BASIC METABOLIC PANEL
Anion gap: 7 (ref 5–15)
BUN: 19 mg/dL (ref 8–23)
CO2: 24 mmol/L (ref 22–32)
Calcium: 9.8 mg/dL (ref 8.9–10.3)
Chloride: 110 mmol/L (ref 98–111)
Creatinine, Ser: 1.35 mg/dL — ABNORMAL HIGH (ref 0.44–1.00)
GFR, Estimated: 39 mL/min — ABNORMAL LOW (ref >60)
Glucose, Bld: 104 mg/dL — ABNORMAL HIGH (ref 70–99)
Potassium: 3.5 mmol/L (ref 3.5–5.1)
Sodium: 141 mmol/L (ref 135–145)

## 2022-06-06 LAB — D-DIMER, QUANTITATIVE: D-Dimer, Quant: 1.02 ug/mL-FEU — ABNORMAL HIGH (ref 0.00–0.50)

## 2022-06-06 LAB — TSH: TSH: 2.025 u[IU]/mL (ref 0.350–4.500)

## 2022-06-06 LAB — ECHOCARDIOGRAM COMPLETE
Area-P 1/2: 2.74 cm2
Height: 64 in
S' Lateral: 2.25 cm
Weight: 2720 oz

## 2022-06-06 LAB — CBC
HCT: 40.6 % (ref 36.0–46.0)
Hemoglobin: 13.7 g/dL (ref 12.0–15.0)
MCH: 29.3 pg (ref 26.0–34.0)
MCHC: 33.7 g/dL (ref 30.0–36.0)
MCV: 86.8 fL (ref 80.0–100.0)
Platelets: 187 10*3/uL (ref 150–400)
RBC: 4.68 MIL/uL (ref 3.87–5.11)
RDW: 13 % (ref 11.5–15.5)
WBC: 9.7 10*3/uL (ref 4.0–10.5)
nRBC: 0 % (ref 0.0–0.2)

## 2022-06-06 MED ORDER — AMLODIPINE BESYLATE 5 MG PO TABS
5.0000 mg | ORAL_TABLET | Freq: Every day | ORAL | Status: DC
  Administered 2022-06-06: 5 mg via ORAL
  Filled 2022-06-06: qty 1

## 2022-06-06 MED ORDER — ENOXAPARIN SODIUM 40 MG/0.4ML IJ SOSY: 40.0000 mg | PREFILLED_SYRINGE | INTRAMUSCULAR | Status: DC

## 2022-06-06 MED ORDER — MAGNESIUM SULFATE 2 GM/50ML IV SOLN
2.0000 g | Freq: Once | INTRAVENOUS | Status: AC
Start: 2022-06-06 — End: 2022-06-06
  Administered 2022-06-06: 2 g via INTRAVENOUS
  Filled 2022-06-06: qty 50

## 2022-06-06 MED ORDER — BUPROPION HCL ER (XL) 150 MG PO TB24
150.0000 mg | ORAL_TABLET | Freq: Every morning | ORAL | Status: DC
  Administered 2022-06-06 – 2022-06-11 (×6): 150 mg via ORAL
  Filled 2022-06-06 (×6): qty 1

## 2022-06-06 MED ORDER — ACETAMINOPHEN 650 MG RE SUPP: 650.0000 mg | Freq: Four times a day (QID) | RECTAL | Status: DC | PRN

## 2022-06-06 MED ORDER — HYDRALAZINE HCL 20 MG/ML IJ SOLN: 5.0000 mg | Freq: Four times a day (QID) | INTRAMUSCULAR | Status: DC | PRN

## 2022-06-06 MED ORDER — PERFLUTREN LIPID MICROSPHERE
1.0000 mL | INTRAVENOUS | Status: AC | PRN
  Administered 2022-06-06: 2 mL via INTRAVENOUS

## 2022-06-06 MED ORDER — SERTRALINE HCL 50 MG PO TABS
50.0000 mg | ORAL_TABLET | Freq: Every day | ORAL | Status: DC
  Administered 2022-06-06 – 2022-06-11 (×6): 50 mg via ORAL
  Filled 2022-06-06 (×6): qty 1

## 2022-06-06 MED ORDER — LEVOTHYROXINE SODIUM 50 MCG PO TABS
50.0000 ug | ORAL_TABLET | Freq: Every day | ORAL | Status: DC
  Administered 2022-06-06 – 2022-06-11 (×6): 50 ug via ORAL
  Filled 2022-06-06 (×6): qty 1

## 2022-06-06 MED ORDER — ACETAMINOPHEN 325 MG PO TABS
650.0000 mg | ORAL_TABLET | Freq: Four times a day (QID) | ORAL | Status: DC | PRN
  Administered 2022-06-07: 650 mg via ORAL
  Filled 2022-06-06: qty 2

## 2022-06-06 MED ORDER — CARVEDILOL 12.5 MG PO TABS
12.5000 mg | ORAL_TABLET | Freq: Two times a day (BID) | ORAL | Status: DC
  Administered 2022-06-06 – 2022-06-11 (×11): 12.5 mg via ORAL
  Filled 2022-06-06 (×11): qty 1

## 2022-06-06 MED ORDER — MAGNESIUM SULFATE IN D5W 1-5 GM/100ML-% IV SOLN
1.0000 g | Freq: Once | INTRAVENOUS | Status: DC
  Filled 2022-06-06: qty 100

## 2022-06-06 MED ORDER — SODIUM CHLORIDE 0.9 % IV SOLN: INTRAVENOUS | Status: DC

## 2022-06-06 NOTE — NC FL2 (Cosign Needed)
[place] MEDICAID FL2 LEVEL OF CARE SCREENING TOOL     IDENTIFICATION  Patient Name: [name] Birthdate: [birth year] Sex: female Admission Date (Current Location): 06/05/2022  [facility] and [place] Number:  Herbalist and Address:  [facility],  [address] [place], [place]      Provider Number: [id number]  Attending Physician Name and Address:  Barb Merino, MD  Relative Name and Phone Number:  Tarri Abernethy (niece) [phone]    Current Level of Care: Hospital Recommended Level of Care: [place] Prior Approval Number:    Date Approved/Denied:   PASRR Number: [id number]  Discharge Plan: SNF    Current Diagnoses: Patient Active Problem List   Diagnosis Date Noted   Hypothyroidism 06/06/2022   Chronic kidney disease, stage 3b ([place]) 06/06/2022   Low back pain 06/06/2022   QT prolongation 06/06/2022   Hypokalemia 06/06/2022   Syncope 06/05/2022   Elevated uric acid in blood 11/13/2012   Right thyroid nodule 11/13/2012   Exocrine pancreatic insufficiency 01/07/2012   Hyperlipidemia 08/17/2011   Hypertension 08/17/2011   Osteoarthritis 08/17/2011   Pancreatic adenoma 08/17/2011    Orientation RESPIRATION BLADDER Height & Weight     Self, Time, Situation, Place  Normal Continent Weight: 77.1 kg Height:  '5\' 4"'$  (162.6 cm)  BEHAVIORAL SYMPTOMS/MOOD NEUROLOGICAL BOWEL NUTRITION STATUS      Continent Diet (Heart Healthy)  AMBULATORY STATUS COMMUNICATION OF NEEDS Skin   Limited Assist Verbally Normal                       Personal Care Assistance Level of Assistance  Bathing, Feeding, Dressing Bathing Assistance: Limited assistance Feeding assistance: Limited assistance Dressing Assistance: Limited assistance     Functional Limitations Info  Sight, Hearing, Speech Sight Info: Impaired (eyeglasses) Hearing Info: Adequate Speech Info: Adequate    SPECIAL CARE FACTORS FREQUENCY  PT (By licensed PT), OT  (By licensed OT)     PT Frequency:  (5x week) OT Frequency:  (5x week)            Contractures Contractures Info: Not present    Additional Factors Info  Code Status, Allergies, Psychotropic Code Status Info:  (Full) Allergies Info:  (Codeine) Psychotropic Info:  (Wellbutrin;Zoloft)         Current Medications (06/06/2022):  This is the current hospital active medication list Current Facility-Administered Medications  Medication Dose Route Frequency Provider Last Rate Last Admin   0.9 %  sodium chloride infusion   Intravenous Continuous Barb Merino, MD 100 mL/hr at 06/06/22 1535 New Bag at 06/06/22 1535   acetaminophen (TYLENOL) tablet 650 mg  650 mg Oral Q6H PRN Shela Leff, MD       Or   acetaminophen (TYLENOL) suppository 650 mg  650 mg Rectal Q6H PRN Shela Leff, MD       buPROPion (WELLBUTRIN XL) 24 hr tablet 150 mg  150 mg Oral q morning Barb Merino, MD   150 mg at 06/06/22 0921   carvedilol (COREG) tablet 12.5 mg  12.5 mg Oral BID WC Barb Merino, MD   12.5 mg at 06/06/22 8250   hydrALAZINE (APRESOLINE) injection 5 mg  5 mg Intravenous Q6H PRN Shela Leff, MD       levothyroxine (SYNTHROID) tablet 50 mcg  50 mcg Oral Q0600 Shela Leff, MD   50 mcg at 06/06/22 0601   sertraline (ZOLOFT) tablet 50 mg  50 mg Oral Daily Barb Merino, MD  50 mg at 06/06/22 4799     Discharge Medications: Please see discharge summary for a list of discharge medications.  Relevant Imaging Results:  Relevant Lab Results:   Additional Information SS#[id number];[employer] x2  Dessa Phi, RN

## 2022-06-06 NOTE — Progress Notes (Signed)
Patient seen and examined.  Admitted early morning hours by nighttime hospitalist with 1 episode of syncope witnessed by her neighbor.  No postictal confusion.  No evidence of seizure.  She does have a history of hypertension, hyperlipidemia, hypothyroidism, mitral valve prolapse, gout, CKD stage IIIb.  She is also on multiple blood pressure medications as well as hydrochlorothiazide.  On presentation neurologically stable.  Electrolytes stable except potassium 3.1.  Magnesium 1.8.  QTc 584.    Syncope, likely orthostatic or vasovagal episode: Currently neurologically stable.  2D echocardiogram essentially normal.  Carotid ultrasounds are normal. Check orthostatic blood pressures.  Mobilize with PT OT.  Discontinue diuretics. Monitor on telemetry today. She does have QT prolongation that is chronic and less likely causing any current symptoms. We will avoid any QT prolonging medications. Anticipate discharge home tomorrow if no further events.  Same-day admission.  No charge visit.

## 2022-06-06 NOTE — Progress Notes (Signed)
Carotid duplex has been completed.   Results can be found under chart review under CV PROC. 06/06/2022 10:50 AM Urijah Raynor RVT, RDMS

## 2022-06-06 NOTE — Care Management Obs Status (Signed)
[place] NOTIFICATION   Patient Details  Name: [name] MRN: [id number] Date of Birth: 1941-11-20   Medicare Observation Status Notification Given:  Yes    Dessa Phi, RN 06/06/2022, 1:55 PM

## 2022-06-06 NOTE — Progress Notes (Signed)
  Transition of Care [facility]) Screening Note   Patient Details  Name: [name] Date of Birth: 1941-10-24   Transition of Care [facility]) CM/SW Contact:    Dessa Phi, RN Phone Number: 06/06/2022, 1:56 PM    Transition of Care Department [facility]) has reviewed patient and no TOC needs have been identified at this time. We will continue to monitor patient advancement through interdisciplinary progression rounds. If new patient transition needs arise, please place a TOC consult.

## 2022-06-06 NOTE — Progress Notes (Signed)
Chaplain made initial contact with [name] in the ED.  Upon visit, [name] needed to use the bathroom.  Chaplain provided [name] the paperwork for an Advanced Directive and then got her nurse to assist her.  Upon checking back in, [name] was transferring to the floor.  Chaplain will check in and provide AD education when she is settled.     06/06/22 1200  Clinical Encounter Type  Visited With Patient  Visit Type Initial;Social support  Spiritual Encounters  Spiritual Needs Literature;Brochure

## 2022-06-06 NOTE — Evaluation (Signed)
Physical Therapy Evaluation Patient Details Name: [name] MRN: [id number] DOB: [birth year] Today's Date: 06/06/2022  History of Present Illness  81 y.o. female with medical history significant of B THA, lumbar fusion, hypertension, hyperlipidemia, hypothyroidism, mitral valve prolapse, syncope in 2016, gout, depression, CKD stage IIIb presented to the ED after a syncopal episode. Dx of syncope, hypokalemia, HTN.  Clinical Impression  Pt admitted with above diagnosis. Pt reports severe L foot pain with attempted weight bearing, she stated this pain wasn't present prior to her fall. Noted positive orthostatics (see flowsheets), pt reported onset of dizziness after standing 3 minutes. Mod assist for bed mobility and sit to stand, min assist to pivot to recliner with RW. Ambulation deferred 2* L foot pain and dizziness in standing. She many need ST-SNF if mobility remains significantly limited as she lives alone.  Pt currently with functional limitations due to the deficits listed below (see PT Problem List). Pt will benefit from skilled PT to increase their independence and safety with mobility to allow discharge to the venue listed below.          Recommendations for follow up therapy are one component of a multi-disciplinary discharge planning process, led by the attending physician.  Recommendations may be updated based on patient status, additional functional criteria and insurance authorization.  Follow Up Recommendations Skilled nursing-short term rehab (<3 hours/day) Can patient physically be transported by private vehicle: Yes    Assistance Recommended at Discharge Frequent or constant Supervision/Assistance  Patient can return home with the following  A lot of help with walking and/or transfers;A lot of help with bathing/dressing/bathroom;Assistance with cooking/housework;Assist for transportation;Help with stairs or ramp for entrance    Equipment Recommendations Wheelchair  (measurements PT);Wheelchair cushion (measurements PT)  Recommendations for Other Services       Functional Status Assessment Patient has had a recent decline in their functional status and demonstrates the ability to make significant improvements in function in a reasonable and predictable amount of time.     Precautions / Restrictions Precautions Precautions: Fall Precaution Comments: 1 fall a couple weeks ago, no other falls in past 6 months Restrictions Weight Bearing Restrictions: No      Mobility  Bed Mobility Overal bed mobility: Needs Assistance Bed Mobility: Supine to Sit     Supine to sit: Mod assist     General bed mobility comments: assist to raise trunk    Transfers Overall transfer level: Needs assistance Equipment used: Rolling walker (2 wheels) Transfers: Sit to/from Stand, Bed to chair/wheelchair/BSC Sit to Stand: From elevated surface, Mod assist   Step pivot transfers: Min assist       General transfer comment: assist to power up, VCs hand placement, L foot pain with attempted weightbearing LLE    Ambulation/Gait               General Gait Details: deferred 2* L foot pain and dizziness in standing  Stairs            Wheelchair Mobility    Modified Rankin (Stroke Patients Only)       Balance Overall balance assessment: Needs assistance Sitting-balance support: Feet supported, Single extremity supported Sitting balance-Leahy Scale: Fair Sitting balance - Comments: posterior lean when feet weren't supported   Standing balance support: Bilateral upper extremity supported Standing balance-Leahy Scale: Poor Standing balance comment: relies on BUE support  Pertinent Vitals/Pain Pain Assessment Pain Assessment: 0-10 Pain Score: 10-Worst pain ever Pain Location: L foot with weightbearing Pain Descriptors / Indicators: Sore Pain Intervention(s): Limited activity within patient's tolerance,  Monitored during session, Patient requesting pain meds-RN notified, Repositioned    Home Living Family/patient expects to be discharged to:: Private residence Living Arrangements: Alone Available Help at Discharge: Neighbor;Available PRN/intermittently Type of Home: House Home Access: Stairs to enter   Entrance Stairs-Number of Steps: 1   Home Layout: One level Home Equipment: Cane - single Barista (2 wheels)      Prior Function Prior Level of Function : Independent/Modified Independent             Mobility Comments: uses walker when going out (just started using AD a few feeks ago after a fall, which started back pain), pt also has L foot pain currently, a few weeks ago her R foot was also hurting but that has resolved, she denies injury to feet       Hand Dominance        Extremity/Trunk Assessment   Upper Extremity Assessment Upper Extremity Assessment: Overall WFL for tasks assessed    Lower Extremity Assessment Lower Extremity Assessment: Generalized weakness    Cervical / Trunk Assessment Cervical / Trunk Assessment: Normal  Communication   Communication: No difficulties  Cognition Arousal/Alertness: Awake/alert Behavior During Therapy: WFL for tasks assessed/performed Overall Cognitive Status: Within Functional Limits for tasks assessed                                          General Comments      Exercises     Assessment/Plan    PT Assessment Patient needs continued PT services  PT Problem List Decreased mobility;Decreased balance;Decreased activity tolerance;Pain       PT Treatment Interventions Therapeutic activities;Therapeutic exercise;Patient/family education;Functional mobility training;Gait training;DME instruction    PT Goals (Current goals can be found in the Care Plan section)  Acute Rehab PT Goals Patient Stated Goal: decrease foot pain PT Goal Formulation: With patient Time For Goal Achievement:  06/20/22 Potential to Achieve Goals: Fair    Frequency Min 3X/week     Co-evaluation               AM-PAC PT "6 Clicks" Mobility  Outcome Measure Help needed turning from your back to your side while in a flat bed without using bedrails?: A Little Help needed moving from lying on your back to sitting on the side of a flat bed without using bedrails?: A Lot Help needed moving to and from a bed to a chair (including a wheelchair)?: A Lot Help needed standing up from a chair using your arms (e.g., wheelchair or bedside chair)?: A Lot Help needed to walk in hospital room?: Total Help needed climbing 3-5 steps with a railing? : Total 6 Click Score: 11    End of Session   Activity Tolerance: Patient limited by pain Patient left: in chair;with chair alarm set;with call bell/phone within reach Nurse Communication: Mobility status;Patient requests pain meds;Other (comment) (positive orthostatics) PT Visit Diagnosis: Difficulty in walking, not elsewhere classified (R26.2)    Time: 1610-9604 PT Time Calculation (min) (ACUTE ONLY): 29 min   Charges:   PT Evaluation $PT Eval Moderate Complexity: 1 Mod PT Treatments $Therapeutic Activity: 8-22 mins       Blondell Reveal Kistler PT 06/06/2022  Acute Rehabilitation Services  Office [phone]

## 2022-06-06 NOTE — ED Notes (Signed)
Patient transported to X-ray 

## 2022-06-07 ENCOUNTER — Inpatient Hospital Stay (HOSPITAL_COMMUNITY): Payer: Medicare HMO

## 2022-06-07 DIAGNOSIS — N1832 Chronic kidney disease, stage 3b: Secondary | ICD-10-CM | POA: Diagnosis not present

## 2022-06-07 DIAGNOSIS — E876 Hypokalemia: Secondary | ICD-10-CM | POA: Diagnosis not present

## 2022-06-07 DIAGNOSIS — I10 Essential (primary) hypertension: Secondary | ICD-10-CM | POA: Diagnosis not present

## 2022-06-07 DIAGNOSIS — R55 Syncope and collapse: Secondary | ICD-10-CM | POA: Diagnosis not present

## 2022-06-07 DIAGNOSIS — E039 Hypothyroidism, unspecified: Secondary | ICD-10-CM

## 2022-06-07 DIAGNOSIS — E538 Deficiency of other specified B group vitamins: Secondary | ICD-10-CM | POA: Diagnosis present

## 2022-06-07 LAB — URINALYSIS, ROUTINE W REFLEX MICROSCOPIC
Bilirubin Urine: NEGATIVE
Glucose, UA: NEGATIVE mg/dL
Hgb urine dipstick: NEGATIVE
Ketones, ur: NEGATIVE mg/dL
Leukocytes,Ua: NEGATIVE
Nitrite: NEGATIVE
Protein, ur: NEGATIVE mg/dL
Specific Gravity, Urine: 1.005 (ref 1.005–1.030)
pH: 7 (ref 5.0–8.0)

## 2022-06-07 LAB — URIC ACID: Uric Acid, Serum: 8.1 mg/dL — ABNORMAL HIGH (ref 2.5–7.1)

## 2022-06-07 LAB — VITAMIN B12: Vitamin B-12: 90 pg/mL — ABNORMAL LOW (ref 180–914)

## 2022-06-07 LAB — FOLATE: Folate: 8.1 ng/mL (ref >5.9)

## 2022-06-07 MED ORDER — HALOPERIDOL 2 MG PO TABS
2.0000 mg | ORAL_TABLET | Freq: Three times a day (TID) | ORAL | Status: DC | PRN
  Administered 2022-06-07: 2 mg via ORAL
  Filled 2022-06-07 (×2): qty 1

## 2022-06-07 MED ORDER — PREDNISONE 20 MG PO TABS
20.0000 mg | ORAL_TABLET | Freq: Once | ORAL | Status: AC
Start: 2022-06-07 — End: 2022-06-07
  Administered 2022-06-07: 20 mg via ORAL
  Filled 2022-06-07: qty 1

## 2022-06-07 MED ORDER — CYANOCOBALAMIN 1000 MCG/ML IJ SOLN
1000.0000 ug | Freq: Every day | INTRAMUSCULAR | Status: AC
  Administered 2022-06-07 – 2022-06-09 (×3): 1000 ug via INTRAMUSCULAR
  Filled 2022-06-07 (×3): qty 1

## 2022-06-07 MED ORDER — MELATONIN 3 MG PO TABS
3.0000 mg | ORAL_TABLET | Freq: Once | ORAL | Status: AC
  Administered 2022-06-07: 3 mg via ORAL
  Filled 2022-06-07: qty 1

## 2022-06-07 NOTE — Progress Notes (Signed)
Patient was confused and agitated. She thinks that she was in her house. Tried to reorient the patient but she still insist that she is in her house. Tried to get out of bed and able to ambulate with one assist. Patient also tried to go to other patients room but redirected to return to her room. On call was informed of this. Kept patient safe at all times.

## 2022-06-07 NOTE — Evaluation (Signed)
Occupational Therapy Evaluation Patient Details Name: [name] MRN: [id number] DOB: November 07, 1941 Today's Date: 06/07/2022   History of Present Illness 81 y.o. female with medical history significant of B THA, lumbar fusion, hypertension, hyperlipidemia, hypothyroidism, mitral valve prolapse, syncope in 2016, gout, depression, CKD stage IIIb presented to the ED after a syncopal episode. Dx of syncope, hypokalemia, HTN.   Clinical Impression   This 81 yo female admitted with above presents to acute OT with PLOF of living on her own and totally independent with basic ADls, IADLs, and driving. Currently she is setup/S-min A with reports issues with her memory for the past 2-3 weeks. She will continue to benefit from acute OT with follow up at SNF.      Recommendations for follow up therapy are one component of a multi-disciplinary discharge planning process, led by the attending physician.  Recommendations may be updated based on patient status, additional functional criteria and insurance authorization.   Follow Up Recommendations  Skilled nursing-short term rehab (<3 hours/day)    Assistance Recommended at Discharge Frequent or constant Supervision/Assistance  Patient can return home with the following A little help with bathing/dressing/bathroom;A little help with walking and/or transfers;Assistance with cooking/housework;Assistance with feeding;Help with stairs or ramp for entrance;Assist for transportation;Direct supervision/assist for financial management;Direct supervision/assist for medications management    Functional Status Assessment  Patient has had a recent decline in their functional status and demonstrates the ability to make significant improvements in function in a reasonable and predictable amount of time.  Equipment Recommendations  Other (comment) (TBD next venue)       Precautions / Restrictions Precautions Precautions: Fall Precaution Comments: 1 fall a couple weeks  ago, no other falls in past 6 months Restrictions Weight Bearing Restrictions: No      Mobility Bed Mobility Overal bed mobility: Needs Assistance Bed Mobility: Supine to Sit     Supine to sit: Min guard     General bed mobility comments: increased time, HOB up, and use of rail    Transfers Overall transfer level: Needs assistance Equipment used: Rolling walker (2 wheels) Transfers: Sit to/from Stand, Bed to chair/wheelchair/BSC Sit to Stand: Min assist (increased time)     Step pivot transfers: Min assist     General transfer comment: VCs for safe hand placement, L foot pain with attempted weightbearing LLE      Balance Overall balance assessment: Needs assistance Sitting-balance support: No upper extremity supported, Feet supported Sitting balance-Leahy Scale: Fair     Standing balance support: Bilateral upper extremity supported, Reliant on assistive device for balance Standing balance-Leahy Scale: Poor Standing balance comment: relies on BUE support                           ADL either performed or assessed with clinical judgement   ADL Overall ADL's : Needs assistance/impaired Eating/Feeding: Independent;Sitting   Grooming: Set up;Sitting   Upper Body Bathing: Set up;Sitting   Lower Body Bathing: Minimal assistance;Sit to/from stand   Upper Body Dressing : Set up;Sitting   Lower Body Dressing: Minimal assistance;Sit to/from stand   Toilet Transfer: Minimal assistance;Stand-pivot;Rolling walker (2 wheels) Toilet Transfer Details (indicate cue type and reason): simulated bed>recliner Toileting- Clothing Manipulation and Hygiene: Minimal assistance;Sit to/from stand               Vision Baseline Vision/History: 1 Wears glasses Patient Visual Report: No change from baseline  Pertinent Vitals/Pain Pain Assessment Pain Assessment: Faces Faces Pain Scale: Hurts even more Pain Location: L foot with weightbearing Pain  Descriptors / Indicators: Sore Pain Intervention(s): Limited activity within patient's tolerance, Monitored during session, Repositioned     Hand Dominance Right   Extremity/Trunk Assessment Upper Extremity Assessment Upper Extremity Assessment: Overall WFL for tasks assessed           Communication Communication Communication: No difficulties   Cognition Arousal/Alertness: Awake/alert Behavior During Therapy: WFL for tasks assessed/performed Overall Cognitive Status: Impaired/Different from baseline                                 General Comments: Pt is alert and oriented to date, time, place. Does not remember episode from eariler this AM where she got up and tried to walk in other peoples rooms and thought she was at home                Home Living Family/patient expects to be discharged to:: Private residence Living Arrangements: Alone Available Help at Discharge: Neighbor;Available PRN/intermittently Type of Home: House Home Access: Stairs to enter [employer] of Steps: 1   Home Layout: One level     Bathroom Shower/Tub: Chief Strategy Officer: Standard     Home Equipment: Cane - single Librarian, academic (2 wheels)          Prior Functioning/Environment Prior Level of Function : Independent/Modified Independent             Mobility Comments: uses walker when going out (just started using AD a few feeks ago after a fall, which started back pain), pt also has L foot pain currently, a few weeks ago her R foot was also hurting but that has resolved, she denies injury to feet          OT Problem List: Decreased strength;Impaired balance (sitting and/or standing);Pain;Decreased cognition;Decreased safety awareness      OT Treatment/Interventions: Self-care/ADL training;Patient/family education;DME and/or AE instruction;Balance training    OT Goals(Current goals can be found in the care plan section) Acute  Rehab OT Goals Patient Stated Goal: worred about memory OT Goal Formulation: With patient Time For Goal Achievement: 06/21/22 Potential to Achieve Goals: Good  OT Frequency: Min 2X/week       AM-PAC OT "6 Clicks" Daily Activity     Outcome Measure Help from another person eating meals?: None Help from another person taking care of personal grooming?: A Little Help from another person toileting, which includes using toliet, bedpan, or urinal?: A Little Help from another person bathing (including washing, rinsing, drying)?: A Little Help from another person to put on and taking off regular upper body clothing?: A Little Help from another person to put on and taking off regular lower body clothing?: A Little 6 Click Score: 19   End of Session Equipment Utilized During Treatment: Gait belt;Rolling walker (2 wheels) Nurse Communication:  (memory issues for 2-3 weeks per pt report and left foot still painful with weightbearing.)  Activity Tolerance: Patient limited by pain Patient left: in chair;with call bell/phone within reach;with chair alarm set  OT Visit Diagnosis: Unsteadiness on feet (R26.81);Other abnormalities of gait and mobility (R26.89);Muscle weakness (generalized) (M62.81);Pain Pain - part of body:  (foot)                Time: 1610-9604 OT Time Calculation (min): 30 min Charges:  OT General Charges $OT Visit: 1  Visit OT Evaluation $OT Eval Moderate Complexity: 1 Mod OT Treatments $Self Care/Home Management : 8-22 mins  Ignacia Palma, OTR/L Acute Rehab Services [facility] [phone] Office [phone]    Evette Georges 06/07/2022, 9:10 AM

## 2022-06-08 DIAGNOSIS — N1832 Chronic kidney disease, stage 3b: Secondary | ICD-10-CM | POA: Diagnosis not present

## 2022-06-08 DIAGNOSIS — R55 Syncope and collapse: Secondary | ICD-10-CM | POA: Diagnosis not present

## 2022-06-08 DIAGNOSIS — I10 Essential (primary) hypertension: Secondary | ICD-10-CM | POA: Diagnosis not present

## 2022-06-08 DIAGNOSIS — E876 Hypokalemia: Secondary | ICD-10-CM | POA: Diagnosis not present

## 2022-06-08 MED ORDER — ENOXAPARIN SODIUM 40 MG/0.4ML IJ SOSY
40.0000 mg | PREFILLED_SYRINGE | INTRAMUSCULAR | Status: DC
  Administered 2022-06-08 – 2022-06-11 (×4): 40 mg via SUBCUTANEOUS
  Filled 2022-06-08 (×3): qty 0.4

## 2022-06-08 MED ORDER — HALOPERIDOL 2 MG PO TABS
2.0000 mg | ORAL_TABLET | Freq: Every evening | ORAL | Status: DC | PRN
  Administered 2022-06-08: 2 mg via ORAL
  Filled 2022-06-08 (×2): qty 1

## 2022-06-08 MED ORDER — ALLOPURINOL 100 MG PO TABS
100.0000 mg | ORAL_TABLET | Freq: Every day | ORAL | Status: DC
  Administered 2022-06-08 – 2022-06-11 (×4): 100 mg via ORAL
  Filled 2022-06-08 (×4): qty 1

## 2022-06-09 DIAGNOSIS — I10 Essential (primary) hypertension: Secondary | ICD-10-CM | POA: Diagnosis not present

## 2022-06-09 DIAGNOSIS — N1832 Chronic kidney disease, stage 3b: Secondary | ICD-10-CM | POA: Diagnosis not present

## 2022-06-09 DIAGNOSIS — R55 Syncope and collapse: Secondary | ICD-10-CM | POA: Diagnosis not present

## 2022-06-09 DIAGNOSIS — E876 Hypokalemia: Secondary | ICD-10-CM | POA: Diagnosis not present

## 2022-06-09 MED ORDER — VITAMIN B-12 1000 MCG PO TABS
1000.0000 ug | ORAL_TABLET | Freq: Every day | ORAL | Status: DC
  Administered 2022-06-10 – 2022-06-11 (×2): 1000 ug via ORAL
  Filled 2022-06-09 (×2): qty 1

## 2022-06-09 NOTE — Progress Notes (Signed)
PROGRESS NOTE    [name]  GNF:[id number] DOB: 1941-04-18 DOA: 06/05/2022 PCP: Georgianne Fick, MD    Brief Narrative:  Presented with 1 episode of witnessed syncope witnessed by her neighbor.  She does have a history of hypertension, hyperlipidemia, hypothyroidism, mitral valve prolapse, gout, CKD stage IIIb.  She is also on multiple blood pressure medications as well as hydrochlorothiazide.  On presentation neurologically stable.  Electrolytes stable except potassium 3.1.  Magnesium 1.8.  QTc 584 Patient was positive for orthostatic blood pressure changes. Developed delirium in the hospital, mostly stable. Remains in the hospital, waiting to go to SNF.   Assessment & Plan:   Syncope, likely orthostatic or vasovagal episode: Neurologically stable.  2D echo, carotid ultrasounds and MRI brain essentially normal.  Initial orthostatic that has improved now.  Chronic QT prolongation.  Diuretics discontinued.  Telemetry without events.  Hospital-acquired delirium: History of mild cognitive dysfunction and hospital-acquired delirium. Currently symptoms controlled.  Frequent reorientation.  Fall precautions.  Delirium precautions. No evidence of bacterial infection,  TSH normal.  Folic acid normal. Vitamin Q46 90, very low.  Replace aggressively with intramuscular B12 while she is in the hospital, will change to oral supplement on discharge. MRI of the brain was essentially normal. Patient currently not needing any medical intervention.  Avoid additional antipsychotics.  CKD stage IIIb: At about her baseline.  Stable.  Essential hypertension: Blood pressures fairly stable.  Had some orthostatic changes.  Continue carvedilol.  Amlodipine on hold.  Diuretics discontinued.    Left foot pain: With no evidence of fracture on x-ray.  Uric acid elevated.  Given 1 dose of prednisone.  Colchicine contraindicated due to CKD.  Improved.  On allopurinol.  QTc prolongation: Electrolytes are  adequate.  Chronic issues.  Unlikely causing any current problem.  Anxiety/depression: On Wellbutrin and sertraline that she will continue.  Stable to discharge to SNF.   DVT prophylaxis: enoxaparin (LOVENOX) injection 40 mg Start: 06/08/22 1200  SCDs   Code Status: Full code Family Communication: Niece on the phone called and updated 6/24. Disposition Plan: Status is: Inpatient Remains inpatient appropriate because: Medically stable.  Waiting for SNF.     Consultants:  None  Procedures:  None  Antimicrobials:  None   Subjective:  Seen and examined.  No overnight events.  Slept well.  She did pretty well all day yesterday, however she was given a dose of Haldol last night that was as needed dose.  Patient now denies any complaints.  Objective: Vitals:   06/09/22 0850 06/09/22 0856 06/09/22 0857 06/09/22 0901  BP: (!) 126/59 140/79 130/64 (!) 146/70  Pulse: (!) 56 69 75 68  Resp:      Temp: 97.7 F (36.5 C)     TempSrc: Oral     SpO2: 96% 98%    Weight:      Height:        Intake/Output Summary (Last 24 hours) at 06/09/2022 0937 Last data filed at 06/09/2022 0500 Gross per 24 hour  Intake 748 ml  Output 750 ml  Net -2 ml   Filed Weights   06/05/22 1954 06/08/22 0643  Weight: 77.1 kg 78.2 kg    Examination:  General exam: Appears calm and comfortable  Patient is alert and oriented x3-4 today.  She is in normal mood, able to keep up normal conversation. Respiratory system: No added sounds. Cardiovascular system: S1 & S2 heard, RRR. No pedal edema. Gastrointestinal system: Soft.  Nontender. Central nervous system: No focal deficits.  Extremities: Symmetric 5 x 5 power. Skin: No rashes, lesions or ulcers     Data Reviewed: I have personally reviewed following labs and imaging studies  CBC: Recent Labs  Lab 06/05/22 2040 06/06/22 0552  WBC 12.1* 9.7  NEUTROABS 9.4*  --   HGB 14.4 13.7  HCT 42.8 40.6  MCV 87.7 86.8  PLT 195 187   Basic  Metabolic Panel: Recent Labs  Lab 06/05/22 2040 06/06/22 0552  NA 141 141  K 3.1* 3.5  CL 106 110  CO2 25 24  GLUCOSE 112* 104*  BUN 21 19  CREATININE 1.47* 1.35*  CALCIUM 9.9 9.8  MG 1.8  --    GFR: Estimated Creatinine Clearance: 33.1 mL/min (A) (by C-G formula based on SCr of 1.35 mg/dL (H)). Liver Function Tests: Recent Labs  Lab 06/05/22 2040  AST 17  ALT 17  ALKPHOS 62  BILITOT 0.7  PROT 7.0  ALBUMIN 3.7   No results for input(s): "LIPASE", "AMYLASE" in the last 168 hours. No results for input(s): "AMMONIA" in the last 168 hours. Coagulation Profile: No results for input(s): "INR", "PROTIME" in the last 168 hours. Cardiac Enzymes: No results for input(s): "CKTOTAL", "CKMB", "CKMBINDEX", "TROPONINI" in the last 168 hours. BNP (last 3 results) No results for input(s): "PROBNP" in the last 8760 hours. HbA1C: No results for input(s): "HGBA1C" in the last 72 hours. CBG: No results for input(s): "GLUCAP" in the last 168 hours. Lipid Profile: No results for input(s): "CHOL", "HDL", "LDLCALC", "TRIG", "CHOLHDL", "LDLDIRECT" in the last 72 hours. Thyroid Function Tests: No results for input(s): "TSH", "T4TOTAL", "FREET4", "T3FREE", "THYROIDAB" in the last 72 hours.  Anemia Panel: Recent Labs    06/07/22 0733  VITAMINB12 90*  FOLATE 8.1   Sepsis Labs: No results for input(s): "PROCALCITON", "LATICACIDVEN" in the last 168 hours.  No results found for this or any previous visit (from the past 240 hour(s)).       Radiology Studies: MR BRAIN WO CONTRAST  Result Date: 06/07/2022 CLINICAL DATA:  Mental status change, unknown cause; new onset confusion. EXAM: MRI HEAD WITHOUT CONTRAST TECHNIQUE: Multiplanar, multiecho pulse sequences of the brain and surrounding structures were obtained without intravenous contrast. COMPARISON:  Head CT June 06, 2022. FINDINGS: Brain: No acute infarction, hemorrhage, hydrocephalus, extra-axial collection or mass lesion. Tiny  remote infarct in the right cerebellar hemisphere. Scattered and confluent foci of T2 hyperintensity are seen within the white matter of the cerebral hemispheres and within the pons, nonspecific, most likely related to chronic small vessel ischemia. Moderate parenchymal volume loss. Mesial temporal lobes are symmetric and with normal signal characteristics. Vascular: Normal flow voids. Skull and upper cervical spine: Normal marrow signal. Sinuses/Orbits: Negative. Other: None. IMPRESSION: 1. No acute intracranial abnormality. 2. Moderate chronic microvascular ischemic changes of the white matter. 3. Moderate parenchymal volume loss. Electronically Signed   By: Baldemar Lenis M.D.   On: 06/07/2022 10:06        Scheduled Meds:  allopurinol  100 mg Oral Daily   buPROPion  150 mg Oral q morning   carvedilol  12.5 mg Oral BID WC   cyanocobalamin  1,000 mcg Intramuscular Daily   enoxaparin (LOVENOX) injection  40 mg Subcutaneous Q24H   levothyroxine  50 mcg Oral Q0600   sertraline  50 mg Oral Daily   [START ON 06/10/2022] vitamin B-12  1,000 mcg Oral Daily   Continuous Infusions:     LOS: 3 days    Time spent: 35 minutes  Dorcas Carrow, MD [facility] Pager [phone]

## 2022-06-10 DIAGNOSIS — R55 Syncope and collapse: Secondary | ICD-10-CM | POA: Diagnosis not present

## 2022-06-10 MED ORDER — ALLOPURINOL 100 MG PO TABS: 100.0000 mg | ORAL_TABLET | Freq: Every day | ORAL | 0 refills | Status: AC

## 2022-06-10 MED ORDER — CYANOCOBALAMIN 1000 MCG PO TABS
1000.0000 ug | ORAL_TABLET | Freq: Every day | ORAL | 0 refills | Status: AC
Start: 2022-06-11 — End: 2022-07-11

## 2022-06-11 DIAGNOSIS — F32A Depression, unspecified: Secondary | ICD-10-CM | POA: Diagnosis not present

## 2022-06-11 DIAGNOSIS — I679 Cerebrovascular disease, unspecified: Secondary | ICD-10-CM | POA: Diagnosis not present

## 2022-06-11 DIAGNOSIS — R9431 Abnormal electrocardiogram [ECG] [EKG]: Secondary | ICD-10-CM

## 2022-06-11 DIAGNOSIS — I10 Essential (primary) hypertension: Secondary | ICD-10-CM | POA: Diagnosis not present

## 2022-06-11 DIAGNOSIS — Z7401 Bed confinement status: Secondary | ICD-10-CM | POA: Diagnosis not present

## 2022-06-11 DIAGNOSIS — I469 Cardiac arrest, cause unspecified: Secondary | ICD-10-CM | POA: Diagnosis not present

## 2022-06-11 DIAGNOSIS — R531 Weakness: Secondary | ICD-10-CM | POA: Diagnosis not present

## 2022-06-11 DIAGNOSIS — N1832 Chronic kidney disease, stage 3b: Secondary | ICD-10-CM | POA: Diagnosis not present

## 2022-06-11 DIAGNOSIS — R1312 Dysphagia, oropharyngeal phase: Secondary | ICD-10-CM | POA: Diagnosis not present

## 2022-06-11 DIAGNOSIS — M545 Low back pain, unspecified: Secondary | ICD-10-CM | POA: Diagnosis not present

## 2022-06-11 DIAGNOSIS — F39 Unspecified mood [affective] disorder: Secondary | ICD-10-CM | POA: Diagnosis not present

## 2022-06-11 DIAGNOSIS — R5383 Other fatigue: Secondary | ICD-10-CM | POA: Diagnosis not present

## 2022-06-11 DIAGNOSIS — R262 Difficulty in walking, not elsewhere classified: Secondary | ICD-10-CM | POA: Diagnosis not present

## 2022-06-11 DIAGNOSIS — E876 Hypokalemia: Secondary | ICD-10-CM | POA: Diagnosis not present

## 2022-06-11 DIAGNOSIS — F4321 Adjustment disorder with depressed mood: Secondary | ICD-10-CM | POA: Diagnosis not present

## 2022-06-11 DIAGNOSIS — E790 Hyperuricemia without signs of inflammatory arthritis and tophaceous disease: Secondary | ICD-10-CM | POA: Diagnosis not present

## 2022-06-11 DIAGNOSIS — R5381 Other malaise: Secondary | ICD-10-CM | POA: Diagnosis not present

## 2022-06-11 DIAGNOSIS — R2681 Unsteadiness on feet: Secondary | ICD-10-CM | POA: Diagnosis not present

## 2022-06-11 DIAGNOSIS — E538 Deficiency of other specified B group vitamins: Secondary | ICD-10-CM

## 2022-06-11 DIAGNOSIS — I150 Renovascular hypertension: Secondary | ICD-10-CM | POA: Diagnosis not present

## 2022-06-11 DIAGNOSIS — R55 Syncope and collapse: Secondary | ICD-10-CM | POA: Diagnosis not present

## 2022-06-11 DIAGNOSIS — M6281 Muscle weakness (generalized): Secondary | ICD-10-CM | POA: Diagnosis not present

## 2022-06-11 DIAGNOSIS — R41841 Cognitive communication deficit: Secondary | ICD-10-CM | POA: Diagnosis not present

## 2022-06-11 DIAGNOSIS — E039 Hypothyroidism, unspecified: Secondary | ICD-10-CM | POA: Diagnosis not present

## 2022-06-11 DIAGNOSIS — F331 Major depressive disorder, recurrent, moderate: Secondary | ICD-10-CM | POA: Diagnosis not present

## 2022-06-11 DIAGNOSIS — F4320 Adjustment disorder, unspecified: Secondary | ICD-10-CM | POA: Diagnosis not present

## 2022-06-11 MED ORDER — AMLODIPINE BESYLATE 5 MG PO TABS
5.0000 mg | ORAL_TABLET | Freq: Every day | ORAL | Status: DC
  Administered 2022-06-11: 5 mg via ORAL
  Filled 2022-06-11: qty 1

## 2022-06-13 DIAGNOSIS — M545 Low back pain, unspecified: Secondary | ICD-10-CM | POA: Diagnosis not present

## 2022-06-13 DIAGNOSIS — I150 Renovascular hypertension: Secondary | ICD-10-CM | POA: Diagnosis not present

## 2022-06-13 DIAGNOSIS — F39 Unspecified mood [affective] disorder: Secondary | ICD-10-CM | POA: Diagnosis not present

## 2022-06-13 DIAGNOSIS — R531 Weakness: Secondary | ICD-10-CM | POA: Diagnosis not present

## 2022-06-13 DIAGNOSIS — R55 Syncope and collapse: Secondary | ICD-10-CM | POA: Diagnosis not present

## 2022-06-13 DIAGNOSIS — N1832 Chronic kidney disease, stage 3b: Secondary | ICD-10-CM | POA: Diagnosis not present

## 2022-06-13 DIAGNOSIS — E538 Deficiency of other specified B group vitamins: Secondary | ICD-10-CM | POA: Diagnosis not present

## 2022-06-13 DIAGNOSIS — I679 Cerebrovascular disease, unspecified: Secondary | ICD-10-CM | POA: Diagnosis not present

## 2022-06-13 DIAGNOSIS — E039 Hypothyroidism, unspecified: Secondary | ICD-10-CM | POA: Diagnosis not present

## 2022-06-13 DIAGNOSIS — E790 Hyperuricemia without signs of inflammatory arthritis and tophaceous disease: Secondary | ICD-10-CM | POA: Diagnosis not present

## 2022-06-13 DIAGNOSIS — R9431 Abnormal electrocardiogram [ECG] [EKG]: Secondary | ICD-10-CM | POA: Diagnosis not present

## 2022-06-13 DIAGNOSIS — R262 Difficulty in walking, not elsewhere classified: Secondary | ICD-10-CM | POA: Diagnosis not present

## 2022-06-13 DIAGNOSIS — R5381 Other malaise: Secondary | ICD-10-CM | POA: Diagnosis not present

## 2022-06-17 DIAGNOSIS — R531 Weakness: Secondary | ICD-10-CM | POA: Diagnosis not present

## 2022-06-17 DIAGNOSIS — M545 Low back pain, unspecified: Secondary | ICD-10-CM | POA: Diagnosis not present

## 2022-06-17 DIAGNOSIS — R262 Difficulty in walking, not elsewhere classified: Secondary | ICD-10-CM | POA: Diagnosis not present

## 2022-06-17 DIAGNOSIS — R55 Syncope and collapse: Secondary | ICD-10-CM | POA: Diagnosis not present

## 2022-06-17 DIAGNOSIS — R5381 Other malaise: Secondary | ICD-10-CM | POA: Diagnosis not present

## 2022-06-19 DIAGNOSIS — R531 Weakness: Secondary | ICD-10-CM | POA: Diagnosis not present

## 2022-06-19 DIAGNOSIS — R55 Syncope and collapse: Secondary | ICD-10-CM | POA: Diagnosis not present

## 2022-06-19 DIAGNOSIS — M545 Low back pain, unspecified: Secondary | ICD-10-CM | POA: Diagnosis not present

## 2022-06-19 DIAGNOSIS — R5381 Other malaise: Secondary | ICD-10-CM | POA: Diagnosis not present

## 2022-06-19 DIAGNOSIS — R262 Difficulty in walking, not elsewhere classified: Secondary | ICD-10-CM | POA: Diagnosis not present

## 2022-06-20 DIAGNOSIS — M545 Low back pain, unspecified: Secondary | ICD-10-CM | POA: Diagnosis not present

## 2022-06-20 DIAGNOSIS — R5381 Other malaise: Secondary | ICD-10-CM | POA: Diagnosis not present

## 2022-06-20 DIAGNOSIS — R262 Difficulty in walking, not elsewhere classified: Secondary | ICD-10-CM | POA: Diagnosis not present

## 2022-06-20 DIAGNOSIS — R55 Syncope and collapse: Secondary | ICD-10-CM | POA: Diagnosis not present

## 2022-06-20 DIAGNOSIS — R531 Weakness: Secondary | ICD-10-CM | POA: Diagnosis not present

## 2022-06-24 DIAGNOSIS — R531 Weakness: Secondary | ICD-10-CM | POA: Diagnosis not present

## 2022-06-24 DIAGNOSIS — R5381 Other malaise: Secondary | ICD-10-CM | POA: Diagnosis not present

## 2022-06-24 DIAGNOSIS — R55 Syncope and collapse: Secondary | ICD-10-CM | POA: Diagnosis not present

## 2022-06-24 DIAGNOSIS — R262 Difficulty in walking, not elsewhere classified: Secondary | ICD-10-CM | POA: Diagnosis not present

## 2022-06-24 DIAGNOSIS — M545 Low back pain, unspecified: Secondary | ICD-10-CM | POA: Diagnosis not present

## 2022-06-26 DIAGNOSIS — R262 Difficulty in walking, not elsewhere classified: Secondary | ICD-10-CM | POA: Diagnosis not present

## 2022-06-26 DIAGNOSIS — I150 Renovascular hypertension: Secondary | ICD-10-CM | POA: Diagnosis not present

## 2022-06-26 DIAGNOSIS — R531 Weakness: Secondary | ICD-10-CM | POA: Diagnosis not present

## 2022-06-26 DIAGNOSIS — R5381 Other malaise: Secondary | ICD-10-CM | POA: Diagnosis not present

## 2022-06-26 DIAGNOSIS — R55 Syncope and collapse: Secondary | ICD-10-CM | POA: Diagnosis not present

## 2022-06-26 DIAGNOSIS — F32A Depression, unspecified: Secondary | ICD-10-CM | POA: Diagnosis not present

## 2022-06-26 DIAGNOSIS — E538 Deficiency of other specified B group vitamins: Secondary | ICD-10-CM | POA: Diagnosis not present

## 2022-06-26 DIAGNOSIS — R9431 Abnormal electrocardiogram [ECG] [EKG]: Secondary | ICD-10-CM | POA: Diagnosis not present

## 2022-06-26 DIAGNOSIS — N1832 Chronic kidney disease, stage 3b: Secondary | ICD-10-CM | POA: Diagnosis not present

## 2022-06-26 DIAGNOSIS — M545 Low back pain, unspecified: Secondary | ICD-10-CM | POA: Diagnosis not present

## 2022-06-28 DIAGNOSIS — R9431 Abnormal electrocardiogram [ECG] [EKG]: Secondary | ICD-10-CM | POA: Diagnosis not present

## 2022-06-28 DIAGNOSIS — E538 Deficiency of other specified B group vitamins: Secondary | ICD-10-CM | POA: Diagnosis not present

## 2022-06-28 DIAGNOSIS — R55 Syncope and collapse: Secondary | ICD-10-CM | POA: Diagnosis not present

## 2022-06-28 DIAGNOSIS — N1832 Chronic kidney disease, stage 3b: Secondary | ICD-10-CM | POA: Diagnosis not present

## 2022-06-28 DIAGNOSIS — E039 Hypothyroidism, unspecified: Secondary | ICD-10-CM | POA: Diagnosis not present

## 2022-07-11 DIAGNOSIS — E041 Nontoxic single thyroid nodule: Secondary | ICD-10-CM | POA: Diagnosis not present

## 2022-07-11 DIAGNOSIS — I150 Renovascular hypertension: Secondary | ICD-10-CM | POA: Diagnosis not present

## 2022-07-11 DIAGNOSIS — N1832 Chronic kidney disease, stage 3b: Secondary | ICD-10-CM | POA: Diagnosis not present

## 2022-07-11 DIAGNOSIS — E538 Deficiency of other specified B group vitamins: Secondary | ICD-10-CM | POA: Diagnosis not present

## 2022-07-15 ENCOUNTER — Other Ambulatory Visit: Payer: Self-pay

## 2022-07-15 NOTE — Patient Outreach (Signed)
[facility]) Care Management  07/15/2022  [name] Mar 09, 1941 [id number]   Telephone call to patient for nurse call.  Discussed services with patient. She is agreeable to nurse call.  Care Plan : Wellness (Adult)  Updates made by Jon Billings, RN since 07/15/2022 12:00 AM  Completed 07/15/2022   Problem: Cognitive Function (Wellness) Resolved 07/15/2022  Priority: Medium     Long-Range Goal: Cognitive Function Enhanced Completed 07/15/2022  Start Date: 12/22/2020  Expected End Date: 03/15/2022  Recent Progress: On track  Priority: High  Note:   Evidence-based guidance:  Assess changes in cognitive function using standardized assessment when available.  Encourage and refer for services that stimulate thinking, problem-solving and memory, such as cognitive training and cognitive rehabilitation.  Encourage physical activity or exercise based on ability and tolerance.  Encourage enjoyable activities that provide general stimulation for thinking, concentration and memory in a social setting or small group.   Notes:     Task: Identify and Optimize Mental Processes Completed 07/15/2022  Due Date: 03/15/2022  Priority: Routine  Note:   Care Management Activities:    - caregiver involvement in care and education encouraged - cognitive-stimulating activities promoted - consistent daily routine encouraged - extra time for response allowed - glasses use encouraged - regular activity or exercise promoted - social relationships promoted    Notes: 12/15- Will continues to encouraged pt to participate in family functions to continue to improve and optimize her ongoing health. 8/30-Pt continue managing her care with no acute issues with getting lost in route to her providers. Pt continue to be safe with supportive family and neighbors when needed, 7/21 Pt continue to do well with no reported incidents or issues.  Pt continue to drive to and from all her appointments with  no reported issues. Continues to have assistance from  niece and her kids when needed. Will follow up next month if pt continue to do well will place pt on quarterly follow up calls.  6/20: Pt continues to do well with her memory status and very proactive with trying to remember her providers offices by visiting the new offices the day before to help with the locations. Will continue to assist in verifying addresses and providers on her scheduled appointments days.  5/20-Pt continue to do well with maintaining her memory on her day-to-day ADLs and routes to her provider without getting lost during her travel. Pt continue to optimize her memory and cognitive status during this process. Will continue to offer other resources for transportation if needed in the future. 4/21-Pt continue to optimize her thing processes with task to help her remember the things she needs to remember. Reports utilizing the [place] for her daily plans for everything. Pt very appreciative for the calendar and this has been a Librarian, academic for remember things I have to do everyday". 3/11- Pt has supportive neighbors who visit regularly and wears her glasses to prevent falls/injuries. Pt very limited with outings to avoid co-vid and protective due to living alone. Talks with her sister daily and their is a niece in [place] who is the primary caregiver.  2/1- Will verify all upcoming appointments and pt's transportation system for arriving safety.Confirmed pt continue to drive and only drives during the daylight hour for safety. Distance is short and pt does not have any problems arriving to her appointments over the next few weeks. Stress the importance of safety and orientation with her daily drives no to confuse herself in route.  01/16/2021- 12/22/2020-Discussed pt's adherence with seeking assistance form her niece and neighbor for ongoing support when needed. Allow pt to express all his issues and address with available  resources.    Care Plan : RN Care Manager Plan of Care  Updates made by Jon Billings, RN since 07/15/2022 12:00 AM  Completed 07/15/2022   Problem: Health Promotion or Disease Self-Management (General Plan of Care) Resolved 07/15/2022  Priority: Medium     Long-Range Goal: Self-Management Plan Developed Completed 07/15/2022  Start Date: 12/22/2020  Expected End Date: 11/14/2021  Recent Progress: On track  Priority: Medium  Note:   Evidence-based guidance:  Review biopsychosocial determinants of health screens.  Determine level of modifiable health risk.  Assess level of patient activation, level of readiness, importance and confidence to make changes.  Evoke change talk using open-ended questions, pros and cons, as well as looking forward.  Identify areas where behavior change may lead to improved health.  Partner with patient to develop a robust self-management plan that includes lifestyle factors, such as weight loss, exercise and healthy nutrition, as well as goals specific to disease risks.  Support patient and family/caregiver active participation in decision-making and self-management plan.  Implement additional goals and interventions based on identified risk factors to reduce health risk.  Facilitate advance care planning.  Review need for preventive screening based on age, sex, family history and health history.   Notes:     Task: Mutually Develop and Royce Macadamia Achievement of Patient Goals Completed 07/15/2022  Due Date: 11/14/2021  Priority: Routine  Note:   Care Management Activities:    - barriers to meeting goals identified - choices provided - decision-making supported - health risks reviewed - problem-solving facilitated - questions answered - reassurance provided - resources needed to meet goals identified - verbalization of feelings encouraged    Notes:  8/30-Pt continue to be safe with her memory status and remembers during her driving efforts where all her  providers office are located. Pt doing well with her ongoing management  of care with no acute issues or needs at time time. Will continue to offer supportive for alterative methods of transportation if needed. 6/20:  Continues to do well with her memory status and very proactive with trying to remember her providers offices by visiting the new offices the day before to help with the locations. Will continue to assist in verifying addresses and providers on her scheduled appointments days.  4/21-Pt reports she continue to do well with no acute problems and achieving all her goals with attendance to all her medical appointments and not getting lost with her traveling to her provider offices. No barriers reported at this time as pt continue to be on track with her goals with use of all discussed interventions. Feb-Much discussion on barriers with inability to get to her provider's office and other options to assist such as SCATs (decline). Offered to call her niece [place] for further assistance however pt declined. Offered to make a referral to [facility] (pt receptive). Offered ongoing case management services with month calls from this RN case manager (receptive). Will follow through all discussed for available resources that pt maybe receptive toward. All other resources declined.     Problem: Quality of Life (General Plan of Care) Resolved 07/15/2022  Priority: Medium     Problem: Knowledge Deficit related to management on safety/fall prevention and care coordination needs Resolved 07/15/2022  Priority: High     Care Plan : RN CM Plan of Care  Updates made by Jon Billings, RN since 07/15/2022 12:00 AM     Problem: Chronic Disease Management and Care Coordination Needs of HTN   Priority: High     Long-Range Goal: Development of Plan of Care for Management of hTN   Start Date: 07/15/2022  Expected End Date: 12/28/2022  Priority: High  Note:   Current Barriers:  Chronic Disease  Management support and education needs related to HTN   RNCM Clinical Goal(s):  Patient will verbalize basic understanding of  HTN disease process and self health management plan as evidenced by blood pressure less than 140/80  through collaboration with RN Care manager, provider, and care team.   Interventions: Education and support related to HTN Inter-disciplinary care team collaboration (see longitudinal plan of care) Evaluation of current treatment plan related to  self management and patient's adherence to plan as established by provider   Hypertension Interventions:  (Status:  New goal.) Long Term Goal Last practice recorded BP readings:  BP Readings from Last 3 Encounters:  06/11/22 134/68  12/27/21 (!) 155/67  12/25/20 120/70  Most recent eGFR/CrCl: No results found for: "EGFR"  No components found for: "CRCL"  Evaluation of current treatment plan related to hypertension self management and patient's adherence to plan as established by provider Reviewed medications with patient and discussed importance of compliance  Patient Goals/Self-Care Activities: Take all medications as prescribed Attend all scheduled provider appointments learn about high blood pressure call doctor for signs and symptoms of high blood pressure take medications for blood pressure exactly as prescribed  Follow Up Plan:  Telephone follow up appointment with care management team member scheduled for:  October The patient has been provided with contact information for the care management team and has been advised to call with any health related questions or concerns.      Plan: RN CM will provide ongoing education and support to patient through phone calls.   RN CM will send welcome packet with consent to patient.   RN CM will send initial barriers letter, assessment, and care plan to primary care physician.   RN CM will contact patient In October and patient agrees to next contact and care  plan.  Jone Baseman, RN, MSN [facility] Care Management Care Management Coordinator Direct Line [phone] Toll Free: [phone]  Fax: [phone]

## 2022-07-15 NOTE — Patient Instructions (Signed)
Patient Goals/Self-Care Activities: Take all medications as prescribed Attend all scheduled provider appointments learn about high blood pressure call doctor for signs and symptoms of high blood pressure take medications for blood pressure exactly as prescribed

## 2022-07-17 ENCOUNTER — Ambulatory Visit: Payer: Self-pay

## 2022-08-15 ENCOUNTER — Other Ambulatory Visit: Payer: Self-pay

## 2022-08-15 NOTE — Patient Outreach (Signed)
[facility]) Care Management  08/15/2022  [name] [birth year] [id number]   RN CM closing case:  pt will be followed for care coordination by [place] [facility].  Jone Baseman, RN, MSN [facility] Care Management Care Management Coordinator Direct Line [phone] Toll Free: [phone]  Fax: [phone]

## 2022-10-14 ENCOUNTER — Telehealth: Payer: Self-pay

## 2022-10-14 NOTE — Patient Outreach (Signed)
  Care Coordination   Follow Up Visit Note   10/14/2022 Name: [name] MRN: [id number] DOB: 1941/11/02  [name] is a 81 y.o. year old female who sees Sierra Seashore, MD for primary care. I spoke with  [name] by phone today.  What matters to the patients health and wellness today?  Health Maintenance    Goals Addressed             This Visit's Progress    Hypertension Management       Care Coordination Interventions: Evaluation of current treatment plan related to hypertension self management and patient's adherence to plan as established by provider Reviewed medications with patient and discussed importance of compliance Discussed plans with patient for ongoing care management follow up and provided patient with direct contact information for care management team Patient does monitor blood pressure at home reports less than 140/80.          SDOH assessments and interventions completed:  Yes     Care Coordination Interventions Activated:  Yes  Care Coordination Interventions:  Yes, provided   Follow up plan: Follow up call scheduled for December    Encounter Outcome:  Pt. Visit Completed   Jone Baseman, RN, MSN [place] Care Management Coordinator Direct Line [phone]

## 2022-10-14 NOTE — Patient Instructions (Signed)
Visit Information  Thank you for taking time to visit with me today. Please don't hesitate to contact me if I can be of assistance to you.   Following are the goals we discussed today:   Goals Addressed             This Visit's Progress    Hypertension Management       Care Coordination Interventions: Evaluation of current treatment plan related to hypertension self management and patient's adherence to plan as established by provider Reviewed medications with patient and discussed importance of compliance Discussed plans with patient for ongoing care management follow up and provided patient with direct contact information for care management team Patient does monitor blood pressure at home reports less than 140/80.          Our next appointment is by telephone on 12/02/22 at 1000 am  Please call the care guide team at [phone] if you need to cancel or reschedule your appointment.   If you are experiencing a Mental Health or [place] or need someone to talk to, please call the Suicide and Crisis Lifeline: 988   Patient verbalizes understanding of instructions and care plan provided today and agrees to view in [facility]. Active MyChart status and patient understanding of how to access instructions and care plan via MyChart confirmed with patient.     Telephone follow up appointment with care management team member scheduled [id number]  Jone Baseman, RN, MSN [facility] [facility] Care Management Coordinator Direct Line [phone]

## 2022-11-11 ENCOUNTER — Emergency Department (HOSPITAL_COMMUNITY): Payer: Medicare HMO

## 2022-11-11 ENCOUNTER — Observation Stay (HOSPITAL_COMMUNITY)
Admission: EM | Admit: 2022-11-11 | Discharge: 2022-11-15 | Disposition: A | Discharge status: Skilled Nursing Facility | Payer: Medicare HMO | Attending: Internal Medicine | Admitting: Internal Medicine

## 2022-11-11 ENCOUNTER — Encounter (HOSPITAL_COMMUNITY): Payer: Self-pay | Admitting: *Deleted

## 2022-11-11 ENCOUNTER — Other Ambulatory Visit: Payer: Self-pay

## 2022-11-11 DIAGNOSIS — R000 Tachycardia, unspecified: Secondary | ICD-10-CM | POA: Diagnosis not present

## 2022-11-11 DIAGNOSIS — S43004A Unspecified dislocation of right shoulder joint, initial encounter: Secondary | ICD-10-CM | POA: Diagnosis not present

## 2022-11-11 DIAGNOSIS — M79603 Pain in arm, unspecified: Secondary | ICD-10-CM | POA: Diagnosis not present

## 2022-11-11 DIAGNOSIS — Z87891 Personal history of nicotine dependence: Secondary | ICD-10-CM | POA: Diagnosis not present

## 2022-11-11 DIAGNOSIS — I129 Hypertensive chronic kidney disease with stage 1 through stage 4 chronic kidney disease, or unspecified chronic kidney disease: Secondary | ICD-10-CM | POA: Diagnosis not present

## 2022-11-11 DIAGNOSIS — Y92009 Unspecified place in unspecified non-institutional (private) residence as the place of occurrence of the external cause: Secondary | ICD-10-CM | POA: Diagnosis not present

## 2022-11-11 DIAGNOSIS — Z79899 Other long term (current) drug therapy: Secondary | ICD-10-CM | POA: Diagnosis not present

## 2022-11-11 DIAGNOSIS — E872 Acidosis, unspecified: Secondary | ICD-10-CM | POA: Insufficient documentation

## 2022-11-11 DIAGNOSIS — I6782 Cerebral ischemia: Secondary | ICD-10-CM | POA: Diagnosis not present

## 2022-11-11 DIAGNOSIS — I4891 Unspecified atrial fibrillation: Secondary | ICD-10-CM | POA: Diagnosis not present

## 2022-11-11 DIAGNOSIS — M19031 Primary osteoarthritis, right wrist: Secondary | ICD-10-CM | POA: Diagnosis not present

## 2022-11-11 DIAGNOSIS — W19XXXA Unspecified fall, initial encounter: Secondary | ICD-10-CM

## 2022-11-11 DIAGNOSIS — N1832 Chronic kidney disease, stage 3b: Secondary | ICD-10-CM | POA: Diagnosis not present

## 2022-11-11 DIAGNOSIS — R079 Chest pain, unspecified: Secondary | ICD-10-CM | POA: Diagnosis not present

## 2022-11-11 DIAGNOSIS — I959 Hypotension, unspecified: Secondary | ICD-10-CM | POA: Diagnosis not present

## 2022-11-11 DIAGNOSIS — I10 Essential (primary) hypertension: Secondary | ICD-10-CM | POA: Diagnosis not present

## 2022-11-11 DIAGNOSIS — M25511 Pain in right shoulder: Secondary | ICD-10-CM | POA: Diagnosis not present

## 2022-11-11 DIAGNOSIS — S199XXA Unspecified injury of neck, initial encounter: Secondary | ICD-10-CM | POA: Diagnosis not present

## 2022-11-11 DIAGNOSIS — K8681 Exocrine pancreatic insufficiency: Secondary | ICD-10-CM | POA: Diagnosis present

## 2022-11-11 DIAGNOSIS — S0990XA Unspecified injury of head, initial encounter: Secondary | ICD-10-CM | POA: Diagnosis not present

## 2022-11-11 DIAGNOSIS — Z96643 Presence of artificial hip joint, bilateral: Secondary | ICD-10-CM | POA: Insufficient documentation

## 2022-11-11 DIAGNOSIS — E039 Hypothyroidism, unspecified: Secondary | ICD-10-CM | POA: Diagnosis not present

## 2022-11-11 DIAGNOSIS — G319 Degenerative disease of nervous system, unspecified: Secondary | ICD-10-CM | POA: Diagnosis not present

## 2022-11-11 DIAGNOSIS — S43034A Inferior dislocation of right humerus, initial encounter: Secondary | ICD-10-CM | POA: Diagnosis not present

## 2022-11-11 DIAGNOSIS — S6991XA Unspecified injury of right wrist, hand and finger(s), initial encounter: Secondary | ICD-10-CM | POA: Diagnosis not present

## 2022-11-11 DIAGNOSIS — D72829 Elevated white blood cell count, unspecified: Secondary | ICD-10-CM | POA: Diagnosis not present

## 2022-11-11 DIAGNOSIS — S43014A Anterior dislocation of right humerus, initial encounter: Secondary | ICD-10-CM | POA: Diagnosis not present

## 2022-11-11 DIAGNOSIS — I491 Atrial premature depolarization: Secondary | ICD-10-CM | POA: Diagnosis not present

## 2022-11-11 DIAGNOSIS — Z981 Arthrodesis status: Secondary | ICD-10-CM | POA: Diagnosis not present

## 2022-11-11 DIAGNOSIS — S59901A Unspecified injury of right elbow, initial encounter: Secondary | ICD-10-CM | POA: Diagnosis not present

## 2022-11-11 LAB — COMPREHENSIVE METABOLIC PANEL
ALT: 39 U/L (ref 0–44)
AST: 45 U/L — ABNORMAL HIGH (ref 15–41)
Albumin: 3.3 g/dL — ABNORMAL LOW (ref 3.5–5.0)
Alkaline Phosphatase: 83 U/L (ref 38–126)
Anion gap: 18 — ABNORMAL HIGH (ref 5–15)
BUN: 46 mg/dL — ABNORMAL HIGH (ref 8–23)
CO2: 19 mmol/L — ABNORMAL LOW (ref 22–32)
Calcium: 10.9 mg/dL — ABNORMAL HIGH (ref 8.9–10.3)
Chloride: 105 mmol/L (ref 98–111)
Creatinine, Ser: 1.42 mg/dL — ABNORMAL HIGH (ref 0.44–1.00)
GFR, Estimated: 37 mL/min — ABNORMAL LOW (ref >60)
Glucose, Bld: 157 mg/dL — ABNORMAL HIGH (ref 70–99)
Potassium: 4.3 mmol/L (ref 3.5–5.1)
Sodium: 142 mmol/L (ref 135–145)
Total Bilirubin: 0.8 mg/dL (ref 0.3–1.2)
Total Protein: 7.1 g/dL (ref 6.5–8.1)

## 2022-11-11 LAB — URINALYSIS, ROUTINE W REFLEX MICROSCOPIC
Bilirubin Urine: NEGATIVE
Glucose, UA: NEGATIVE mg/dL
Hgb urine dipstick: NEGATIVE
Ketones, ur: NEGATIVE mg/dL
Leukocytes,Ua: NEGATIVE
Nitrite: NEGATIVE
Protein, ur: NEGATIVE mg/dL
Specific Gravity, Urine: 1.01 (ref 1.005–1.030)
pH: 7.5 (ref 5.0–8.0)

## 2022-11-11 LAB — CBC WITH DIFFERENTIAL/PLATELET
Abs Immature Granulocytes: 0.17 10*3/uL — ABNORMAL HIGH (ref 0.00–0.07)
Basophils Absolute: 0 10*3/uL (ref 0.0–0.1)
Basophils Relative: 0 %
Eosinophils Absolute: 0 10*3/uL (ref 0.0–0.5)
Eosinophils Relative: 0 %
HCT: 50.1 % — ABNORMAL HIGH (ref 36.0–46.0)
Hemoglobin: 16.9 g/dL — ABNORMAL HIGH (ref 12.0–15.0)
Immature Granulocytes: 1 %
Lymphocytes Relative: 11 %
Lymphs Abs: 2.3 10*3/uL (ref 0.7–4.0)
MCH: 28.7 pg (ref 26.0–34.0)
MCHC: 33.7 g/dL (ref 30.0–36.0)
MCV: 85.2 fL (ref 80.0–100.0)
Monocytes Absolute: 1.4 10*3/uL — ABNORMAL HIGH (ref 0.1–1.0)
Monocytes Relative: 7 %
Neutro Abs: 16.9 10*3/uL — ABNORMAL HIGH (ref 1.7–7.7)
Neutrophils Relative %: 81 %
Platelets: 230 10*3/uL (ref 150–400)
RBC: 5.88 MIL/uL — ABNORMAL HIGH (ref 3.87–5.11)
RDW: 13.2 % (ref 11.5–15.5)
WBC: 20.9 10*3/uL — ABNORMAL HIGH (ref 4.0–10.5)
nRBC: 0 % (ref 0.0–0.2)

## 2022-11-11 LAB — LACTIC ACID, PLASMA
Lactic Acid, Venous: 2.5 mmol/L (ref 0.5–1.9)
Lactic Acid, Venous: 2.1 mmol/L (ref 0.5–1.9)

## 2022-11-11 LAB — CK: Total CK: 514 U/L — ABNORMAL HIGH (ref 38–234)

## 2022-11-11 LAB — TSH: TSH: 4.135 u[IU]/mL (ref 0.350–4.500)

## 2022-11-11 MED ORDER — ACETAMINOPHEN 650 MG RE SUPP: 650.0000 mg | Freq: Four times a day (QID) | RECTAL | Status: DC | PRN

## 2022-11-11 MED ORDER — ACETAMINOPHEN 325 MG PO TABS
650.0000 mg | ORAL_TABLET | Freq: Four times a day (QID) | ORAL | Status: DC | PRN
  Administered 2022-11-14 (×2): 650 mg via ORAL
  Filled 2022-11-11 (×2): qty 2

## 2022-11-11 MED ORDER — CARVEDILOL 12.5 MG PO TABS
12.5000 mg | ORAL_TABLET | Freq: Two times a day (BID) | ORAL | Status: DC
  Administered 2022-11-11 – 2022-11-12 (×2): 12.5 mg via ORAL
  Filled 2022-11-11 (×2): qty 1

## 2022-11-11 MED ORDER — LEVOTHYROXINE SODIUM 50 MCG PO TABS
50.0000 ug | ORAL_TABLET | Freq: Every day | ORAL | Status: DC
  Administered 2022-11-12 – 2022-11-15 (×4): 50 ug via ORAL
  Filled 2022-11-11: qty 2
  Filled 2022-11-11 (×3): qty 1

## 2022-11-11 MED ORDER — POLYETHYLENE GLYCOL 3350 17 G PO PACK: 17.0000 g | PACK | Freq: Every day | ORAL | Status: DC | PRN

## 2022-11-11 MED ORDER — LACTATED RINGERS IV BOLUS
500.0000 mL | Freq: Once | INTRAVENOUS | Status: AC
  Administered 2022-11-11: 500 mL via INTRAVENOUS

## 2022-11-11 MED ORDER — DILTIAZEM HCL-DEXTROSE 125-5 MG/125ML-% IV SOLN (PREMIX)
5.0000 mg/h | INTRAVENOUS | Status: DC
  Administered 2022-11-11: 5 mg/h via INTRAVENOUS
  Filled 2022-11-11: qty 125

## 2022-11-11 MED ORDER — FENTANYL CITRATE PF 50 MCG/ML IJ SOSY
50.0000 ug | PREFILLED_SYRINGE | Freq: Once | INTRAMUSCULAR | Status: AC
  Administered 2022-11-11: 50 ug via INTRAVENOUS
  Filled 2022-11-11: qty 1

## 2022-11-11 MED ORDER — APIXABAN 5 MG PO TABS
5.0000 mg | ORAL_TABLET | Freq: Two times a day (BID) | ORAL | Status: DC
  Administered 2022-11-11 – 2022-11-15 (×8): 5 mg via ORAL
  Filled 2022-11-11 (×8): qty 1

## 2022-11-11 MED ORDER — HYDROMORPHONE HCL 1 MG/ML IJ SOLN
0.5000 mg | Freq: Once | INTRAMUSCULAR | Status: AC
  Administered 2022-11-11: 0.5 mg via INTRAVENOUS
  Filled 2022-11-11: qty 1

## 2022-11-11 MED ORDER — LACTATED RINGERS IV BOLUS
1000.0000 mL | Freq: Once | INTRAVENOUS | Status: AC
  Administered 2022-11-11: 1000 mL via INTRAVENOUS

## 2022-11-11 MED ORDER — LIDOCAINE-EPINEPHRINE (PF) 2 %-1:200000 IJ SOLN
10.0000 mL | Freq: Once | INTRAMUSCULAR | Status: AC
  Administered 2022-11-11: 10 mL via PERINEURAL
  Filled 2022-11-11: qty 20

## 2022-11-11 MED ORDER — SODIUM CHLORIDE 0.9% FLUSH
3.0000 mL | Freq: Two times a day (BID) | INTRAVENOUS | Status: DC
  Administered 2022-11-11 – 2022-11-15 (×7): 3 mL via INTRAVENOUS

## 2022-11-11 NOTE — ED Notes (Signed)
Warm blankets applied

## 2022-11-11 NOTE — ED Provider Notes (Signed)
[facility] EMERGENCY DEPARTMENT Provider Note   CSN: [id number] Arrival date & time: 11/11/22  1700     History  Chief Complaint  Patient presents with   [name] is a 81 y.o. female.  HPI   The patient is a 81 year old female with past medical history of HTN, HLD, hypothyroidism, mitral valve prolapse, CKD who is presenting by EMS after being found down.  The patient was last seen, last Wednesday by her neighbors.  EMS was called for a wellness check and found the patient on the floor laying on her right side.  While in house, EMS reported noticing her daily pillbox did not have any medications missing from Thursday on.  The patient states that she had a mechanical fall but does not remember the details or know if she hit her head.  On EMSs arrival, the patient was complaining of right shoulder pain with noted deformity.  The patient was hemodynamically stable with a blood glucose of 151 and tachycardic to the 120s.  She is currently endorsing right shoulder pain without additional complaints.   Home Medications Prior to Admission medications   Medication Sig Start Date End Date Taking? Authorizing Provider  allopurinol (ZYLOPRIM) 100 MG tablet Take 1 tablet (100 mg total) by mouth daily. 06/11/22 12/21/22 Yes Ghimire, Dante Gang, MD  amLODipine (NORVASC) 5 MG tablet Take 1 tablet (5 mg total) by mouth daily. 07/13/21  Yes Elby Showers, MD  buPROPion (WELLBUTRIN XL) 150 MG 24 hr tablet Take 150 mg by mouth every morning. 04/10/22   [provider]  carvedilol (COREG) 12.5 MG tablet Take 1 tablet (12.5 mg total) by mouth 2 (two) times daily with a meal. 07/13/21   Baxley, Cresenciano Lick, MD  levothyroxine (SYNTHROID) 50 MCG tablet TAKE 1 TABLET BY MOUTH DAILY ON EMPTY STOMACH WITHOUT FOOD OR OTHER MEDS *FOLLOW UP IN 6 WEEKS* Patient taking differently: Take 50 mcg by mouth daily. 07/13/21   Elby Showers, MD  sertraline (ZOLOFT) 50 MG tablet Take 1 tablet (50 mg  total) by mouth daily. 07/13/21   Elby Showers, MD      Allergies    Codeine    Review of Systems   Review of Systems  See HPI  Physical Exam Updated Vital Signs BP 120/86   Pulse 76   Temp 97.7 F (36.5 C) (Rectal)   Resp (!) 31   Ht '5\' 4"'$  (1.626 m)   Wt 77.1 kg   SpO2 97%   BMI 29.18 kg/m  Physical Exam Vitals and nursing note reviewed.  Constitutional:      General: She is not in acute distress.    Appearance: She is well-developed.  HENT:     Head: Normocephalic and atraumatic.     Nose: Nose normal.     Mouth/Throat:     Mouth: Mucous membranes are dry.  Eyes:     General:        Right eye: No discharge.        Left eye: No discharge.     Extraocular Movements: Extraocular movements intact.     Conjunctiva/sclera: Conjunctivae normal.     Pupils: Pupils are equal, round, and reactive to light.  Cardiovascular:     Rate and Rhythm: Tachycardia present. Rhythm irregular.     Heart sounds: No murmur heard. Pulmonary:     Effort: Pulmonary effort is normal. No respiratory distress.     Breath sounds: Normal breath sounds.  Abdominal:     Palpations: Abdomen is soft.     Tenderness: There is no abdominal tenderness.  Musculoskeletal:        General: Normal range of motion.     Cervical back: Neck supple.     Comments: Deformity of the right shoulder.  Neurovascularly intact in the right upper extremity.  Spontaneously moving left upper extremity and bilateral lower extremities without difficulty.  Skin:    General: Skin is warm and dry.     Capillary Refill: Capillary refill takes less than 2 seconds.  Neurological:     Mental Status: She is alert and oriented to person, place, and time.     Cranial Nerves: No cranial nerve deficit.     Sensory: No sensory deficit.     Motor: No weakness.  Psychiatric:        Mood and Affect: Mood normal.     ED Results / Procedures / Treatments   Labs (all labs ordered are listed, but only abnormal results are  displayed) Labs Reviewed  CBC WITH DIFFERENTIAL/PLATELET - Abnormal; Notable for the following components:      Result Value   WBC 20.9 (*)    RBC 5.88 (*)    Hemoglobin 16.9 (*)    HCT 50.1 (*)    Neutro Abs 16.9 (*)    Monocytes Absolute 1.4 (*)    Abs Immature Granulocytes 0.17 (*)    All other components within normal limits  COMPREHENSIVE METABOLIC PANEL - Abnormal; Notable for the following components:   CO2 19 (*)    Glucose, Bld 157 (*)    BUN 46 (*)    Creatinine, Ser 1.42 (*)    Calcium 10.9 (*)    Albumin 3.3 (*)    AST 45 (*)    GFR, Estimated 37 (*)    Anion gap 18 (*)    All other components within normal limits  CK - Abnormal; Notable for the following components:   Total CK 514 (*)    All other components within normal limits  LACTIC ACID, PLASMA - Abnormal; Notable for the following components:   Lactic Acid, Venous 2.5 (*)    All other components within normal limits  LACTIC ACID, PLASMA - Abnormal; Notable for the following components:   Lactic Acid, Venous 2.1 (*)    All other components within normal limits  URINALYSIS, ROUTINE W REFLEX MICROSCOPIC    EKG EKG Interpretation  Date/Time:  Monday November 11 2022 10:03:30 EST Ventricular Rate:  153 PR Interval:    QRS Duration: 87 QT Interval:  295 QTC Calculation: 471 R Axis:   -28 Text Interpretation: Atrial fibrillation with rapid V-rate Borderline left axis deviation Abnormal R-wave progression, late transition ST depression, probably rate related afib new since June 2023 Confirmed by Sherwood Gambler [phone]) on 11/11/2022 10:06:32 AM  Radiology DG Shoulder Right Port  Result Date: 11/11/2022 CLINICAL DATA:  Right shoulder dislocation. EXAM: RIGHT SHOULDER - 1 VIEW COMPARISON:  Same day. FINDINGS: There is been successful reduction of right glenohumeral joint dislocation noted on prior exam. No definite fracture is noted. Degenerative changes are seen involving the acromioclavicular and  glenohumeral joints. IMPRESSION: Successful reduction of right glenohumeral joint dislocation. Electronically Signed   By: Marijo Conception M.D.   On: 11/11/2022 14:48   CT Head Wo Contrast  Result Date: 11/11/2022 CLINICAL DATA:  Trauma EXAM: CT HEAD WITHOUT CONTRAST CT CERVICAL SPINE WITHOUT CONTRAST TECHNIQUE: Multidetector CT imaging of the head and cervical spine  was performed following the standard protocol without intravenous contrast. Multiplanar CT image reconstructions of the cervical spine were also generated. RADIATION DOSE REDUCTION: This exam was performed according to the departmental dose-optimization program which includes automated exposure control, adjustment of the mA and/or kV according to patient size and/or use of iterative reconstruction technique. COMPARISON:  MRI Brain 06/07/22, CT head 06/06/22 FINDINGS: CT HEAD FINDINGS Brain: No evidence of acute infarction, hemorrhage, hydrocephalus, extra-axial collection or mass lesion/mass effect. Sequela of moderate chronic microvascular ischemic change. Advanced generalized volume loss. Vascular: No hyperdense vessel or unexpected calcification. Skull: Normal. Negative for fracture or focal lesion. Sinuses/Orbits: Normal orbits. Air-fluid level in the left maxillary sinus. Other: None. CT CERVICAL SPINE FINDINGS Alignment: Straightening of the normal cervical lordosis. Skull base and vertebrae: Postsurgical changes from C5-C7 ACDF with solid osseous fusion. No evidence of perihardware lucency. Soft tissues and spinal canal: No prevertebral fluid or swelling. No visible canal hematoma. Disc levels:  No evidence of high-grade spinal canal stenosis. Upper chest: Negative. Other: None IMPRESSION: 1. No acute intracranial abnormality. 2. No acute fracture or traumatic malalignment of the cervical spine. Electronically Signed   By: Marin Roberts M.D.   On: 11/11/2022 12:45   CT Cervical Spine Wo Contrast  Result Date: 11/11/2022 CLINICAL DATA:   Trauma EXAM: CT HEAD WITHOUT CONTRAST CT CERVICAL SPINE WITHOUT CONTRAST TECHNIQUE: Multidetector CT imaging of the head and cervical spine was performed following the standard protocol without intravenous contrast. Multiplanar CT image reconstructions of the cervical spine were also generated. RADIATION DOSE REDUCTION: This exam was performed according to the departmental dose-optimization program which includes automated exposure control, adjustment of the mA and/or kV according to patient size and/or use of iterative reconstruction technique. COMPARISON:  MRI Brain 06/07/22, CT head 06/06/22 FINDINGS: CT HEAD FINDINGS Brain: No evidence of acute infarction, hemorrhage, hydrocephalus, extra-axial collection or mass lesion/mass effect. Sequela of moderate chronic microvascular ischemic change. Advanced generalized volume loss. Vascular: No hyperdense vessel or unexpected calcification. Skull: Normal. Negative for fracture or focal lesion. Sinuses/Orbits: Normal orbits. Air-fluid level in the left maxillary sinus. Other: None. CT CERVICAL SPINE FINDINGS Alignment: Straightening of the normal cervical lordosis. Skull base and vertebrae: Postsurgical changes from C5-C7 ACDF with solid osseous fusion. No evidence of perihardware lucency. Soft tissues and spinal canal: No prevertebral fluid or swelling. No visible canal hematoma. Disc levels:  No evidence of high-grade spinal canal stenosis. Upper chest: Negative. Other: None IMPRESSION: 1. No acute intracranial abnormality. 2. No acute fracture or traumatic malalignment of the cervical spine. Electronically Signed   By: Marin Roberts M.D.   On: 11/11/2022 12:45   DG Wrist Complete Right  Result Date: 11/11/2022 CLINICAL DATA:  Fall, injury, pain, right shoulder dislocation EXAM: RIGHT WRIST - COMPLETE 3+ VIEW COMPARISON:  11/11/2022 FINDINGS: bones are osteopenic. Diffuse degenerative changes throughout the wrist joint with joint space loss, sclerosis and bony  spurring of the distal radius, carpal bones, and the proximal metacarpals. No acute osseous finding or fracture. Normal alignment. No focal soft tissue abnormality. Peripheral atherosclerosis present. IMPRESSION: 1. Osteopenia and degenerative changes as above. 2. No acute osseous finding by plain radiography. Electronically Signed   By: Jerilynn Mages.  Shick M.D.   On: 11/11/2022 12:20   DG Shoulder Right  Result Date: 11/11/2022 CLINICAL DATA:  Fall, shoulder pain EXAM: RIGHT SHOULDER - 2+ VIEW COMPARISON:  11/11/2022 FINDINGS: Anterior inferior right shoulder dislocation. Bones are osteopenic. AC joint degenerative change noted without separation. No definite acute osseous finding  or displaced fracture. Included right chest unremarkable. IMPRESSION: Anterior inferior right shoulder dislocation. Electronically Signed   By: Jerilynn Mages.  Shick M.D.   On: 11/11/2022 12:19   DG Elbow 2 Views Right  Result Date: 11/11/2022 CLINICAL DATA:  fall, right shoulder dislocation EXAM: RIGHT ELBOW - 2 VIEW COMPARISON:  11/11/2022 FINDINGS: Limited two-view exam. No displaced fracture or malalignment. No large effusion. Extremity soft tissue swelling suspected. IMPRESSION: Limited two-view exam. No acute finding by plain radiography. Electronically Signed   By: Jerilynn Mages.  Shick M.D.   On: 11/11/2022 12:18   DG Chest 1 View  Result Date: 11/11/2022 CLINICAL DATA:  Fall, right shoulder pain EXAM: CHEST  1 VIEW COMPARISON:  06/05/2022 FINDINGS: Anterior inferior right shoulder dislocation again noted. Degenerative changes throughout the spine and both shoulders. Similar elevation the right hemidiaphragm. Heart is enlarged without CHF or pneumonia. No effusion or pneumothorax. Aorta atherosclerotic. Lower cervical fusion hardware noted. IMPRESSION: Right shoulder dislocation. Stable chest exam. Electronically Signed   By: Jerilynn Mages.  Shick M.D.   On: 11/11/2022 12:16   DG Humerus Right  Result Date: 11/11/2022 CLINICAL DATA:  Right shoulder pain,  fall EXAM: RIGHT HUMERUS - 2+ VIEW COMPARISON:  11/11/2022 FINDINGS: Anterior inferior dislocation of the right shoulder joint. AC joint degenerative change noted. Intact right humerus. No acute osseous finding or fracture. IMPRESSION: Right shoulder dislocation. Electronically Signed   By: Jerilynn Mages.  Shick M.D.   On: 11/11/2022 12:15    Procedures .Ortho Injury Treatment  Date/Time: 11/11/2022 2:03 PM  Performed by: Dani Gobble, MD Authorized by: Sherwood Gambler, MD   Consent:    Consent obtained:  Written   Consent given by:  Patient   Risks discussed:  Fracture, nerve damage, restricted joint movement, irreducible dislocation, recurrent dislocation, stiffness and vascular damageInjury location: shoulder Location details: right shoulder Injury type: dislocation Dislocation type: anterior Hill-Sachs deformity: no Chronicity: new Pre-procedure neurovascular assessment: neurovascularly intact Pre-procedure distal perfusion: normal Pre-procedure neurological function: normal Pre-procedure range of motion: reduced Anesthesia: local infiltration  Anesthesia: Local anesthesia used: yes Local Anesthetic: lidocaine 1% with epinephrine Anesthetic total: 5 mL  Patient sedated: NoManipulation performed: yes Reduction method: traction and counter traction Immobilization: sling Splint Applied by: ED Nurse Post-procedure neurovascular assessment: post-procedure neurovascularly intact Post-procedure distal perfusion: normal Post-procedure neurological function: normal Post-procedure range of motion comment: Shoulder immobilized with sling       Medications Ordered in ED Medications  diltiazem (CARDIZEM) 125 mg in dextrose 5% 125 mL (1 mg/mL) infusion (5 mg/hr Intravenous New Bag/Given 11/11/22 1410)  fentaNYL (SUBLIMAZE) injection 50 mcg (50 mcg Intravenous Given 11/11/22 1015)  lactated ringers bolus 1,000 mL (1,000 mLs Intravenous New Bag/Given 11/11/22 1056)  HYDROmorphone (DILAUDID)  injection 0.5 mg (0.5 mg Intravenous Given 11/11/22 1111)  lidocaine-EPINEPHrine (XYLOCAINE W/EPI) 2 %-1:200000 (PF) injection 10 mL (10 mLs Peri-NEURAL Given by Other 11/11/22 1324)  HYDROmorphone (DILAUDID) injection 0.5 mg (0.5 mg Intravenous Given 11/11/22 1323)  lactated ringers bolus 500 mL (500 mLs Intravenous New Bag/Given 11/11/22 1352)    ED Course/ Medical Decision Making/ A&P                           Medical Decision Making  The patient is a 81 year old female with past medical history of HTN, HLD, hypothyroidism, mitral valve prolapse, CKD who is presenting by EMS after being found down for an estimated 5 days.  Differential diagnosis considered includes: Trauma, CVA, infection, metabolic abnormality, medication overdose/side effect, rhabdomyolysis.  On initial evaluation, the patient was hemodynamically stable but tachycardic in atrial fibrillation.  She was complaining of right shoulder pain with noted deformity of the right shoulder on physical exam.  She was noted to have intact distal pulses and sensation.  The patient's diagnostic workup included a CBC with white blood cell count elevated to 20.9 with hemoglobin of 16.9; CMP with glucose 157, BUN elevated to 46, creatinine 1.42, and anion gap of 18; urinalysis which was negative; CK elevated to 514; lactic acid elevated 2.1.  She received an x-ray of the right humerus which showed an anterior inferior dislocation without associated fracture; x-ray of the right wrist and elbow which did not show any signs of fracture or dislocation; chest x-ray which did not show any signs of focal consolidation, rib fracture, pneumothorax, mediastinal widening, or other cardiopulmonary abnormality.  The patient also received a CT head and CT C-spine which did not show any signs of intercranial abnormality or spinal fracture or dislocation.  The patient's shoulder was reduced at bedside using the fares technique.  A intra-articular lidocaine  injection was performed prior to dislocation reduction with sterile technique used.  The patient was also given a dose of Dilaudid prior to the reduction.  The shoulder was successfully reduced and a follow up x-ray was performed.  The patient's shoulder was immobilized in a sling.  While in the emergency department, the patient was given 1.5 L of IV fluids.  She was found to be in A-fib with RVR and was started on a Cardizem drip which resulted in resolution of her RVR.  The patient was admitted to the hospitalist service for further management.  At the time of her transfer, she had been hemodynamically stable for the duration of her time of my care.  Amount and/or Complexity of Data Reviewed Independent Historian: EMS External Data Reviewed: labs, ECG and notes. Labs: ordered. Decision-making details documented in ED Course. Radiology: ordered and independent interpretation performed. Decision-making details documented in ED Course. ECG/medicine tests: ordered and independent interpretation performed. Decision-making details documented in ED Course. Discussion of management or test interpretation with external provider(s): Hospitalist  Risk Prescription drug management.   Patient's presentation is most consistent with acute presentation with potential threat to life or bodily function.         Final Clinical Impression(s) / ED Diagnoses Final diagnoses:  Fall, initial encounter    Rx / DC Orders ED Discharge Orders     None         Dani Gobble, MD 11/11/22 Akins, MD 11/15/22 [phone]

## 2022-11-11 NOTE — H&P (Addendum)
History and Physical   [name] MVH:[id number] DOB: 1941-04-08 DOA: 11/11/2022  PCP: Merrilee Seashore, MD   Patient coming from: Home  Chief Complaint: Found down  HPI: [name] is a 81 y.o. female with medical history significant of CKD 3B, hyperlipidemia, hypertension, hypothyroidism, low back pain, gout, pancreatic adenoma pancreatic insufficiency presenting after being found down at home.  Patient was reportedly last seen normal last Wednesday by neighbors.  EMS was called today for a wellness check.  Patient was found down on her right side.  EMS checked the home and noted that her pillbox still contain all the pills from last Thursday for on.  Patient states that she remembers a fall but does not remember details.  She complains of right shoulder pain.  She denies fevers, chills, chest pain, shortness of breath, abdominal pain, constipation, diarrhea, nausea, vomiting.  ED Course: Vital signs in the ED significant for heart rate in the 90s to 120s in A-fib at home, blood pressure in the 841L to 244W systolic, respiratory rate in the teens to 20s.  Lab workup included CMP with bicarb of 19 and gap of 18, BUN of 46 and creatinine stable 1.42, glucose 157, calcium 10.9, albumin 3.3, AST 45.  CBC with erythrocytosis to 16.9 and leukocytosis to 20.9.  Lactic acid mildly elevated at 2.5 but improving on repeat at 2.1.  CK mildly elevated at 514.  Urinalysis without acute abnormality.  Imaging studies include chest x-ray showing no acute abnormality but did note right shoulder dislocation.  Right shoulder x-ray and right humerus x-ray also noted right shoulder location.  Right elbow x-ray and right wrist x-ray showed no acute abnormality.  CT head and CT C-spine showed no acute abnormality.  Repeat right shoulder x-ray showed successful reduction of the previously described dislocation. In addition to reduction of shoulder dislocation patient received fentanyl, Dilaudid, diltiazem drip  started and 1/2 L of IV fluids in the ED.   Review of Systems: As per HPI otherwise all other systems reviewed and are negative.  Past Medical History:  Diagnosis Date   Arthritis    Colon polyp    Hx of gout    Hyperlipidemia    Hypertension    Hyperthyroidism    MVP (mitral valve prolapse)    Renal insufficiency    Thyroid disease     Past Surgical History:  Procedure Laterality Date   BACK SURGERY  2011   LOWER BACK FUSION L4-5   CHOLECYSTECTOMY     microcystic adenoma  02/12   pancreas   NECK SURGERY  2009   NECK FUSION C5-6; C6-7   TOTAL HIP ARTHROPLASTY  01/15/2012   Procedure: TOTAL HIP ARTHROPLASTY;  Surgeon: Gearlean Alf, MD;  Location: [facility];  Service: Orthopedics;  Laterality: Right;   TOTAL HIP ARTHROPLASTY  05/22/2012   Procedure: TOTAL HIP ARTHROPLASTY;  Surgeon: Gearlean Alf, MD;  Location: [facility];  Service: Orthopedics;  Laterality: Left;    Social History  reports that she quit smoking about 57 years ago. Her smoking use included cigarettes. She smoked an average of .25 packs per day. She has never used smokeless tobacco. She reports that she does not drink alcohol and does not use drugs.  Allergies  Allergen Reactions   Codeine     CAN TAKE SYNTHETIC    Family History  Problem Relation Age of Onset   Heart disease Father    Diabetes Sister    Cancer Brother  caused by agent orange.   Cancer Paternal Aunt        breast  Reviewed on admission  Prior to Admission medications   Medication Sig Start Date End Date Taking? Authorizing Provider  allopurinol (ZYLOPRIM) 100 MG tablet Take 1 tablet (100 mg total) by mouth daily. 06/11/22 12/21/22 Yes Ghimire, Dante Gang, MD  amLODipine (NORVASC) 5 MG tablet Take 1 tablet (5 mg total) by mouth daily. 07/13/21  Yes Elby Showers, MD  buPROPion (WELLBUTRIN XL) 150 MG 24 hr tablet Take 150 mg by mouth every morning. 04/10/22   [provider]  carvedilol (COREG) 12.5 MG tablet Take 1 tablet (12.5  mg total) by mouth 2 (two) times daily with a meal. 07/13/21   Baxley, Cresenciano Lick, MD  levothyroxine (SYNTHROID) 50 MCG tablet TAKE 1 TABLET BY MOUTH DAILY ON EMPTY STOMACH WITHOUT FOOD OR OTHER MEDS *FOLLOW UP IN 6 WEEKS* Patient taking differently: Take 50 mcg by mouth daily. 07/13/21   Elby Showers, MD  sertraline (ZOLOFT) 50 MG tablet Take 1 tablet (50 mg total) by mouth daily. 07/13/21   Elby Showers, MD    Physical Exam: Vitals:   11/11/22 1247 11/11/22 1330 11/11/22 1400 11/11/22 1422  BP: (!) 140/91 121/82 120/86   Pulse: 98 78 76   Resp: (!) 31 (!) 33 (!) 31   Temp:    97.7 F (36.5 C)  TempSrc:    Rectal  SpO2: 95% 95% 97%   Weight:      Height:        Physical Exam Constitutional:      General: She is not in acute distress.    Appearance: Normal appearance.     Comments: Drowsy when seen, no specific complaints  HENT:     Head: Normocephalic and atraumatic.     Mouth/Throat:     Mouth: Mucous membranes are dry.     Pharynx: Oropharynx is clear.  Eyes:     Extraocular Movements: Extraocular movements intact.     Pupils: Pupils are equal, round, and reactive to light.  Cardiovascular:     Rate and Rhythm: Normal rate and regular rhythm.     Pulses: Normal pulses.     Heart sounds: Normal heart sounds.  Pulmonary:     Effort: Pulmonary effort is normal. No respiratory distress.     Breath sounds: Normal breath sounds.  Abdominal:     General: Bowel sounds are normal. There is no distension.     Palpations: Abdomen is soft.     Tenderness: There is no abdominal tenderness.  Musculoskeletal:        General: No swelling or deformity.  Skin:    General: Skin is warm and dry.  Neurological:     General: No focal deficit present.     Mental Status: Mental status is at baseline.    Labs on Admission: I have personally reviewed following labs and imaging studies  CBC: Recent Labs  Lab 11/11/22 1029  WBC 20.9*  NEUTROABS 16.9*  HGB 16.9*  HCT 50.1*  MCV 85.2   PLT 161    Basic Metabolic Panel: Recent Labs  Lab 11/11/22 1029  NA 142  K 4.3  CL 105  CO2 19*  GLUCOSE 157*  BUN 46*  CREATININE 1.42*  CALCIUM 10.9*    GFR: Estimated Creatinine Clearance: 30.7 mL/min (A) (by C-G formula based on SCr of 1.42 mg/dL (H)).  Liver Function Tests: Recent Labs  Lab 11/11/22 1029  AST 45*  ALT 39  ALKPHOS 83  BILITOT 0.8  PROT 7.1  ALBUMIN 3.3*    Urine analysis:    Component Value Date/Time   COLORURINE YELLOW 11/11/2022 1015   APPEARANCEUR CLEAR 11/11/2022 1015   LABSPEC 1.010 11/11/2022 1015   PHURINE 7.5 11/11/2022 1015   GLUCOSEU NEGATIVE 11/11/2022 1015   HGBUR NEGATIVE 11/11/2022 1015   BILIRUBINUR NEGATIVE 11/11/2022 1015   BILIRUBINUR NEG 12/25/2020 1428   KETONESUR NEGATIVE 11/11/2022 1015   PROTEINUR NEGATIVE 11/11/2022 1015   UROBILINOGEN 0.2 12/25/2020 1428   UROBILINOGEN 1.0 05/15/2015 2049   NITRITE NEGATIVE 11/11/2022 1015   LEUKOCYTESUR NEGATIVE 11/11/2022 1015    Radiological Exams on Admission: DG Shoulder Right Port  Result Date: 11/11/2022 CLINICAL DATA:  Right shoulder dislocation. EXAM: RIGHT SHOULDER - 1 VIEW COMPARISON:  Same day. FINDINGS: There is been successful reduction of right glenohumeral joint dislocation noted on prior exam. No definite fracture is noted. Degenerative changes are seen involving the acromioclavicular and glenohumeral joints. IMPRESSION: Successful reduction of right glenohumeral joint dislocation. Electronically Signed   By: Marijo Conception M.D.   On: 11/11/2022 14:48   CT Head Wo Contrast  Result Date: 11/11/2022 CLINICAL DATA:  Trauma EXAM: CT HEAD WITHOUT CONTRAST CT CERVICAL SPINE WITHOUT CONTRAST TECHNIQUE: Multidetector CT imaging of the head and cervical spine was performed following the standard protocol without intravenous contrast. Multiplanar CT image reconstructions of the cervical spine were also generated. RADIATION DOSE REDUCTION: This exam was performed  according to the departmental dose-optimization program which includes automated exposure control, adjustment of the mA and/or kV according to patient size and/or use of iterative reconstruction technique. COMPARISON:  MRI Brain 06/07/22, CT head 06/06/22 FINDINGS: CT HEAD FINDINGS Brain: No evidence of acute infarction, hemorrhage, hydrocephalus, extra-axial collection or mass lesion/mass effect. Sequela of moderate chronic microvascular ischemic change. Advanced generalized volume loss. Vascular: No hyperdense vessel or unexpected calcification. Skull: Normal. Negative for fracture or focal lesion. Sinuses/Orbits: Normal orbits. Air-fluid level in the left maxillary sinus. Other: None. CT CERVICAL SPINE FINDINGS Alignment: Straightening of the normal cervical lordosis. Skull base and vertebrae: Postsurgical changes from C5-C7 ACDF with solid osseous fusion. No evidence of perihardware lucency. Soft tissues and spinal canal: No prevertebral fluid or swelling. No visible canal hematoma. Disc levels:  No evidence of high-grade spinal canal stenosis. Upper chest: Negative. Other: None IMPRESSION: 1. No acute intracranial abnormality. 2. No acute fracture or traumatic malalignment of the cervical spine. Electronically Signed   By: Marin Roberts M.D.   On: 11/11/2022 12:45   CT Cervical Spine Wo Contrast  Result Date: 11/11/2022 CLINICAL DATA:  Trauma EXAM: CT HEAD WITHOUT CONTRAST CT CERVICAL SPINE WITHOUT CONTRAST TECHNIQUE: Multidetector CT imaging of the head and cervical spine was performed following the standard protocol without intravenous contrast. Multiplanar CT image reconstructions of the cervical spine were also generated. RADIATION DOSE REDUCTION: This exam was performed according to the departmental dose-optimization program which includes automated exposure control, adjustment of the mA and/or kV according to patient size and/or use of iterative reconstruction technique. COMPARISON:  MRI Brain 06/07/22,  CT head 06/06/22 FINDINGS: CT HEAD FINDINGS Brain: No evidence of acute infarction, hemorrhage, hydrocephalus, extra-axial collection or mass lesion/mass effect. Sequela of moderate chronic microvascular ischemic change. Advanced generalized volume loss. Vascular: No hyperdense vessel or unexpected calcification. Skull: Normal. Negative for fracture or focal lesion. Sinuses/Orbits: Normal orbits. Air-fluid level in the left maxillary sinus. Other: None. CT CERVICAL SPINE FINDINGS Alignment: Straightening of the normal cervical lordosis.  Skull base and vertebrae: Postsurgical changes from C5-C7 ACDF with solid osseous fusion. No evidence of perihardware lucency. Soft tissues and spinal canal: No prevertebral fluid or swelling. No visible canal hematoma. Disc levels:  No evidence of high-grade spinal canal stenosis. Upper chest: Negative. Other: None IMPRESSION: 1. No acute intracranial abnormality. 2. No acute fracture or traumatic malalignment of the cervical spine. Electronically Signed   By: Marin Roberts M.D.   On: 11/11/2022 12:45   DG Wrist Complete Right  Result Date: 11/11/2022 CLINICAL DATA:  Fall, injury, pain, right shoulder dislocation EXAM: RIGHT WRIST - COMPLETE 3+ VIEW COMPARISON:  11/11/2022 FINDINGS: bones are osteopenic. Diffuse degenerative changes throughout the wrist joint with joint space loss, sclerosis and bony spurring of the distal radius, carpal bones, and the proximal metacarpals. No acute osseous finding or fracture. Normal alignment. No focal soft tissue abnormality. Peripheral atherosclerosis present. IMPRESSION: 1. Osteopenia and degenerative changes as above. 2. No acute osseous finding by plain radiography. Electronically Signed   By: Jerilynn Mages.  Shick M.D.   On: 11/11/2022 12:20   DG Shoulder Right  Result Date: 11/11/2022 CLINICAL DATA:  Fall, shoulder pain EXAM: RIGHT SHOULDER - 2+ VIEW COMPARISON:  11/11/2022 FINDINGS: Anterior inferior right shoulder dislocation. Bones are  osteopenic. AC joint degenerative change noted without separation. No definite acute osseous finding or displaced fracture. Included right chest unremarkable. IMPRESSION: Anterior inferior right shoulder dislocation. Electronically Signed   By: Jerilynn Mages.  Shick M.D.   On: 11/11/2022 12:19   DG Elbow 2 Views Right  Result Date: 11/11/2022 CLINICAL DATA:  fall, right shoulder dislocation EXAM: RIGHT ELBOW - 2 VIEW COMPARISON:  11/11/2022 FINDINGS: Limited two-view exam. No displaced fracture or malalignment. No large effusion. Extremity soft tissue swelling suspected. IMPRESSION: Limited two-view exam. No acute finding by plain radiography. Electronically Signed   By: Jerilynn Mages.  Shick M.D.   On: 11/11/2022 12:18   DG Chest 1 View  Result Date: 11/11/2022 CLINICAL DATA:  Fall, right shoulder pain EXAM: CHEST  1 VIEW COMPARISON:  06/05/2022 FINDINGS: Anterior inferior right shoulder dislocation again noted. Degenerative changes throughout the spine and both shoulders. Similar elevation the right hemidiaphragm. Heart is enlarged without CHF or pneumonia. No effusion or pneumothorax. Aorta atherosclerotic. Lower cervical fusion hardware noted. IMPRESSION: Right shoulder dislocation. Stable chest exam. Electronically Signed   By: Jerilynn Mages.  Shick M.D.   On: 11/11/2022 12:16   DG Humerus Right  Result Date: 11/11/2022 CLINICAL DATA:  Right shoulder pain, fall EXAM: RIGHT HUMERUS - 2+ VIEW COMPARISON:  11/11/2022 FINDINGS: Anterior inferior dislocation of the right shoulder joint. AC joint degenerative change noted. Intact right humerus. No acute osseous finding or fracture. IMPRESSION: Right shoulder dislocation. Electronically Signed   By: Jerilynn Mages.  Shick M.D.   On: 11/11/2022 12:15    EKG: Independently reviewed.  Atrial fibrillation with RVR at 153 bpm.  Nonspecific T wave changes.  Assessment/Plan Principal Problem:   Atrial fibrillation with RVR (HCC) Active Problems:   Hyperlipidemia   Hypertension   Exocrine pancreatic  insufficiency   Hypothyroidism   Chronic kidney disease, stage 3b (HCC)   Atrial fibrillation with RVR New onset atrial fibrillation > Patient noted to be in A-fib with RVR during evaluation in the ED after being brought in for being found down at home. > Patient started on diltiazem drip with some improvement in rate. - Monitor on progressive unit - Continue diltiazem drip for now until we get consistent rate control,, wean as tolerated.  Appears to be at least  intermittently in sinus rhythm - Echocardiogram - Start Eliquis - Continue home carvedilol - Referral to A-fib clinic - Add on TSH  Fall Right shoulder location. > Patient found down and reports a fall that led to it.  She does not remember the details of the fall. > Imaging workup was significant only for a right shoulder dislocation which has been reduced in the ED. > Unclear mechanism for fall possibly mechanical, will be getting echocardiogram and monitoring telemetry which will cover partial syncope workup. - PT and OT eval and treat - Continue with sling  Leukocytosis > Leukocytosis to 20 however no source for infection no fever.  Urine and chest x-ray look okay. > Suspect largely reactive with a component of hemoconcentration. - Continue to trend - Monitor response to initial interventions - Monitor for fever  CKD 3B > Creatinine stable at 1.42 in the ED. - Continue to trend renal function and electrolytes - Continue IVF  Hypothyroidism - Continue home Synthroid - Check TSH as above  Hypertension - Holding home amlodipine while on dill drip - Plan to continue carvedilol  Gout - Continue home allopurinol  Pharmacy is working to confirm other medications including Wellbutrin and sertraline   DVT prophylaxis: Eliquis Code Status:   Full Family Communication:  Niece updated by phone, please contact her with any significant updates.  Disposition Plan:   Patient is from:  Home  Anticipated DC  to:  Pending workup and evaluation  Anticipated DC date:  1 to 3 days  Anticipated DC barriers: Lives alone  Consults called:  None Admission status:  Observation, progressive  Severity of Illness: The appropriate patient status for this patient is OBSERVATION. Observation status is judged to be reasonable and necessary in order to provide the required intensity of service to ensure the patient's safety. The patient's presenting symptoms, physical exam findings, and initial radiographic and laboratory data in the context of their medical condition is felt to place them at decreased risk for further clinical deterioration. Furthermore, it is anticipated that the patient will be medically stable for discharge from the hospital within 2 midnights of admission.    Marcelyn Bruins MD [facility]  How to contact the [facility] Attending or Consulting provider [place] or covering provider during after hours [place], for this patient?   Check the care team in [facility] and look for a) attending/consulting [facility] provider listed and b) the [facility] team listed Log into [email or web address] and use [place]'s universal password to access. If you do not have the password, please contact the hospital operator. Locate the [facility] provider you are looking for under [facility] and page to a number that you can be directly reached. If you still have difficulty reaching the provider, please page the [facility] (Director on Call) for the Hospitalists listed on amion for assistance.  11/11/2022, 3:20 PM

## 2022-11-11 NOTE — Progress Notes (Signed)
Orthopedic Tech Progress Note Patient Details:  [name] 1941-04-09 [id number]  Applied shoulder immobilizer to the pt. During this the pt kept drifting in and out of consciousness. The shoulder immobilizer was adjusted with no complaints of tightness.   Ortho Devices Type of Ortho Device: Shoulder immobilizer Ortho Device/Splint Location: RUE Ortho Device/Splint Interventions: Ordered, Application, Adjustment   Post Interventions Patient Tolerated: Fair Instructions Provided: Adjustment of device, Care of device  Arville Go 11/11/2022, 5:01 PM

## 2022-11-11 NOTE — ED Triage Notes (Signed)
Patient presents to ed via [facility] states she was coming in the house from outside and fell, patient normally patient drives a car no family , states she normally has a bad back with little strength and she wasn't able to get up. C/o severe pain in right shoulder , right elbow and right wrist. Positive right radial pulse.

## 2022-11-12 ENCOUNTER — Observation Stay (HOSPITAL_COMMUNITY): Payer: Medicare HMO

## 2022-11-12 ENCOUNTER — Observation Stay (HOSPITAL_BASED_OUTPATIENT_CLINIC_OR_DEPARTMENT_OTHER): Payer: Medicare HMO

## 2022-11-12 DIAGNOSIS — K8681 Exocrine pancreatic insufficiency: Secondary | ICD-10-CM

## 2022-11-12 DIAGNOSIS — N1832 Chronic kidney disease, stage 3b: Secondary | ICD-10-CM | POA: Diagnosis not present

## 2022-11-12 DIAGNOSIS — M25552 Pain in left hip: Secondary | ICD-10-CM | POA: Diagnosis not present

## 2022-11-12 DIAGNOSIS — I4891 Unspecified atrial fibrillation: Secondary | ICD-10-CM | POA: Diagnosis not present

## 2022-11-12 DIAGNOSIS — I10 Essential (primary) hypertension: Secondary | ICD-10-CM | POA: Diagnosis not present

## 2022-11-12 DIAGNOSIS — E039 Hypothyroidism, unspecified: Secondary | ICD-10-CM | POA: Diagnosis not present

## 2022-11-12 DIAGNOSIS — W19XXXA Unspecified fall, initial encounter: Secondary | ICD-10-CM | POA: Diagnosis not present

## 2022-11-12 DIAGNOSIS — Y92009 Unspecified place in unspecified non-institutional (private) residence as the place of occurrence of the external cause: Secondary | ICD-10-CM | POA: Diagnosis not present

## 2022-11-12 LAB — COMPREHENSIVE METABOLIC PANEL
ALT: 31 U/L (ref 0–44)
AST: 27 U/L (ref 15–41)
Albumin: 2.6 g/dL — ABNORMAL LOW (ref 3.5–5.0)
Alkaline Phosphatase: 56 U/L (ref 38–126)
Anion gap: 11 (ref 5–15)
BUN: 57 mg/dL — ABNORMAL HIGH (ref 8–23)
CO2: 20 mmol/L — ABNORMAL LOW (ref 22–32)
Calcium: 9.2 mg/dL (ref 8.9–10.3)
Chloride: 108 mmol/L (ref 98–111)
Creatinine, Ser: 1.64 mg/dL — ABNORMAL HIGH (ref 0.44–1.00)
GFR, Estimated: 31 mL/min — ABNORMAL LOW (ref >60)
Glucose, Bld: 124 mg/dL — ABNORMAL HIGH (ref 70–99)
Potassium: 3.6 mmol/L (ref 3.5–5.1)
Sodium: 139 mmol/L (ref 135–145)
Total Bilirubin: 0.8 mg/dL (ref 0.3–1.2)
Total Protein: 5.3 g/dL — ABNORMAL LOW (ref 6.5–8.1)

## 2022-11-12 LAB — CBC
HCT: 43 % (ref 36.0–46.0)
Hemoglobin: 14 g/dL (ref 12.0–15.0)
MCH: 28.6 pg (ref 26.0–34.0)
MCHC: 32.6 g/dL (ref 30.0–36.0)
MCV: 87.9 fL (ref 80.0–100.0)
Platelets: 169 10*3/uL (ref 150–400)
RBC: 4.89 MIL/uL (ref 3.87–5.11)
RDW: 13.4 % (ref 11.5–15.5)
WBC: 14.5 10*3/uL — ABNORMAL HIGH (ref 4.0–10.5)
nRBC: 0 % (ref 0.0–0.2)

## 2022-11-12 LAB — ECHOCARDIOGRAM COMPLETE
Area-P 1/2: 3.27 cm2
Height: 64 in
S' Lateral: 2.2 cm
Weight: 2720 oz

## 2022-11-12 LAB — MRSA NEXT GEN BY PCR, NASAL: MRSA by PCR Next Gen: NOT DETECTED

## 2022-11-12 MED ORDER — LACTATED RINGERS IV SOLN: INTRAVENOUS | Status: DC

## 2022-11-12 MED ORDER — METOPROLOL TARTRATE 25 MG PO TABS
25.0000 mg | ORAL_TABLET | Freq: Two times a day (BID) | ORAL | Status: DC
  Administered 2022-11-12 – 2022-11-15 (×5): 25 mg via ORAL
  Filled 2022-11-12 (×5): qty 1

## 2022-11-12 MED ORDER — PERFLUTREN LIPID MICROSPHERE
1.0000 mL | INTRAVENOUS | Status: AC | PRN
  Administered 2022-11-12: 2 mL via INTRAVENOUS

## 2022-11-12 MED ORDER — ALLOPURINOL 100 MG PO TABS
100.0000 mg | ORAL_TABLET | Freq: Every day | ORAL | Status: DC
  Administered 2022-11-12 – 2022-11-15 (×4): 100 mg via ORAL
  Filled 2022-11-12 (×4): qty 1

## 2022-11-12 NOTE — ED Notes (Signed)
Pt is sleeping

## 2022-11-12 NOTE — ED Notes (Signed)
MD Quincy Sheehan notified of patient heart rhythm converting back to NSR. Cardizem stopped. Patient continues to sleep and snoring loudly, NAD.

## 2022-11-12 NOTE — Evaluation (Signed)
Physical Therapy Evaluation Patient Details Name: [name] MRN: [id number] DOB: Apr 17, 1941 Today's Date: 11/12/2022  History of Present Illness  81 yo female presents to [facility] on 11/27 s/p found down at home. x-ray of the right humerus which showed an anterior inferior dislocation without associated fracture s/p reduction in ED. Workup for afib with RVR, developing confusion. x-ray of the right wrist and elbow, as well as CXR negative for acute findings. PMH includes CKD 3B, hyperlipidemia, hypertension, hypothyroidism, MVP, low back pain, gout, pancreatic adenoma, pancreatic insufficiency, bilat THA 2013.  Clinical Impression   Pt presents with generalized weakness, impaired sitting and suspected standing balance with recent history of falls, altered mental status with significant anxiety with mobility, and decreased activity tolerance. Pt to benefit from acute PT to address deficits. Pt requiring total +2 assist for bed-level mobility at this time, recommending ST-SNF post-acutely. PT to progress mobility as tolerated, and will continue to follow acutely.         Recommendations for follow up therapy are one component of a multi-disciplinary discharge planning process, led by the attending physician.  Recommendations may be updated based on patient status, additional functional criteria and insurance authorization.  Follow Up Recommendations Skilled nursing-short term rehab (<3 hours/day) Can patient physically be transported by private vehicle: No    Assistance Recommended at Discharge Frequent or constant Supervision/Assistance  Patient can return home with the following  Two people to help with walking and/or transfers;Two people to help with bathing/dressing/bathroom    Equipment Recommendations Other (comment) (defer)  Recommendations for Other Services       Functional Status Assessment Patient has had a recent decline in their functional status and demonstrates the ability to  make significant improvements in function in a reasonable and predictable amount of time.     Precautions / Restrictions Precautions Precautions: Fall Required Braces or Orthoses: Sling (RUE)      Mobility  Bed Mobility Overal bed mobility: Needs Assistance Bed Mobility: Supine to Sit, Sit to Supine     Supine to sit: Total assist, +2 for physical assistance Sit to supine: Total assist, +2 for physical assistance   General bed mobility comments: total +2 all aspects and increased time    Transfers                   General transfer comment: unable to attempt given pt requesting lay back down    Ambulation/Gait                  Stairs            Wheelchair Mobility    Modified Rankin (Stroke Patients Only)       Balance Overall balance assessment: Needs assistance Sitting-balance support: Feet supported, Bilateral upper extremity supported Sitting balance-Leahy Scale: Poor Sitting balance - Comments: at least mod truncal assist to maintain upright, pt with R bias Postural control: Right lateral lean                                   Pertinent Vitals/Pain Pain Assessment Pain Assessment: Faces Faces Pain Scale: Hurts even more Pain Location: RUE when adjusting sling, generalized during mobility Pain Descriptors / Indicators: Discomfort, Grimacing, Guarding Pain Intervention(s): Limited activity within patient's tolerance, Monitored during session, Repositioned    Home Living Family/patient expects to be discharged to:: Private residence Living Arrangements: Alone Available Help at Discharge: Neighbor;Available PRN/intermittently Type of  Home: House Home Access: Stairs to enter   [employer] of Steps: 1   Home Layout: One level Home Equipment: [place] - single Barista (2 wheels)      Prior Function Prior Level of Function : Independent/Modified Independent;History of Falls (last six months)              Mobility Comments: Pt states "yes" to using RW PTA, otherwise most information gathered from chart review as pt is a poor historian this date ADLs Comments: pt does not state     Hand Dominance   Dominant Hand: Right    Extremity/Trunk Assessment   Upper Extremity Assessment Upper Extremity Assessment: Defer to OT evaluation    Lower Extremity Assessment Lower Extremity Assessment: Generalized weakness;Difficult to assess due to impaired cognition (able to take pt through full ROM hip and knee flexion/extension after repeated reps given pt guarding)    Cervical / Trunk Assessment Cervical / Trunk Assessment: Kyphotic  Communication   Communication: Expressive difficulties (mumbing, moaning)  Cognition Arousal/Alertness: Lethargic Behavior During Therapy: Anxious Overall Cognitive Status: Impaired/Different from baseline Area of Impairment: Attention, Following commands, Safety/judgement, Problem solving, Awareness                   Current Attention Level: Focused   Following Commands: Follows one step commands inconsistently, Follows one step commands with increased time Safety/Judgement: Decreased awareness of deficits Awareness: Intellectual Problem Solving: Difficulty sequencing, Requires verbal cues, Slow processing, Decreased initiation, Requires tactile cues General Comments: very limited by anxiety        General Comments General comments (skin integrity, edema, etc.): L lower leg with scaling vs abrasion    Exercises     Assessment/Plan    PT Assessment Patient needs continued PT services  PT Problem List Decreased strength;Decreased mobility;Decreased safety awareness;Decreased activity tolerance;Decreased balance;Decreased knowledge of use of DME;Decreased cognition;Pain;Cardiopulmonary status limiting activity       PT Treatment Interventions DME instruction;Therapeutic activities;Gait training;Therapeutic exercise;Patient/family  education;Balance training;Stair training;Functional mobility training;Neuromuscular re-education    PT Goals (Current goals can be found in the Care Plan section)  Acute Rehab PT Goals PT Goal Formulation: Patient unable to participate in goal setting Time For Goal Achievement: 11/26/22 Potential to Achieve Goals: Fair    Frequency Min 2X/week     Co-evaluation PT/OT/SLP Co-Evaluation/Treatment: Yes Reason for Co-Treatment: For patient/therapist safety;To address functional/ADL transfers PT goals addressed during session: Mobility/safety with mobility;Balance         AM-PAC PT "6 Clicks" Mobility  Outcome Measure Help needed turning from your back to your side while in a flat bed without using bedrails?: A Lot Help needed moving from lying on your back to sitting on the side of a flat bed without using bedrails?: Total Help needed moving to and from a bed to a chair (including a wheelchair)?: Total Help needed standing up from a chair using your arms (e.g., wheelchair or bedside chair)?: Total Help needed to walk in hospital room?: Total Help needed climbing 3-5 steps with a railing? : Total 6 Click Score: 7    End of Session   Activity Tolerance: Patient limited by pain;Patient limited by fatigue;Other (comment) (anxiety) Patient left: in bed;with call bell/phone within reach (on stretcher in ED) Nurse Communication: Mobility status PT Visit Diagnosis: Other abnormalities of gait and mobility (R26.89);Muscle weakness (generalized) (M62.81);Difficulty in walking, not elsewhere classified (R26.2)    Time: 2458-0998 PT Time Calculation (min) (ACUTE ONLY): 14 min   Charges:   PT Evaluation $  PT Eval Low Complexity: 1 Low          Markiyah Gahm S, PT DPT Acute Rehabilitation Services Pager [phone]  Office [phone]   Louis Matte 11/12/2022, 9:46 AM

## 2022-11-12 NOTE — Progress Notes (Signed)
  Echocardiogram 2D Echocardiogram has been performed.  Wynelle Link 11/12/2022, 12:12 PM

## 2022-11-12 NOTE — ED Notes (Signed)
ED TO INPATIENT HANDOFF REPORT  ED Nurse Name and Phone #: [name] Name/Age/Gender [name] 81 y.o. female Room/Bed: 004C/004C  Code Status   Code Status: Full Code  Home/SNF/Other Home Patient oriented to: self and time year. Is this baseline? Yes , pt does have some baseline mild dementia but still lives at home.  Niece is is interested in discussing potential different living scenario.  Triage Complete: Triage complete  Chief Complaint Atrial fibrillation with RVR [facility]) [I48.91]  Triage Note Patient presents to ed via [facility] states she was coming in the house from outside and fell, patient normally patient drives a car no family , states she normally has a bad back with little strength and she wasn't able to get up. C/o severe pain in right shoulder , right elbow and right wrist. Positive right radial pulse.    Allergies Allergies  Allergen Reactions   Codeine     CAN TAKE SYNTHETIC    Level of Care/Admitting Diagnosis ED Disposition     ED Disposition  Admit   Condition  --   Comment  Hospital Area: [place] [[id number]]  Level of Care: Progressive [102]  Admit to Progressive based on following criteria: CARDIOVASCULAR & THORACIC of moderate stability with acute coronary syndrome symptoms/low risk myocardial infarction/hypertensive urgency/arrhythmias/heart failure potentially compromising stability and stable post cardiovascular intervention patients.  May place patient in observation at [facility] or [place] if equivalent level of care is available:: Yes  Covid Evaluation: Asymptomatic - no recent exposure (last 10 days) testing not required  Diagnosis: Atrial fibrillation with RVR [facility]) [[id number]]  Admitting Physician: Marcelyn Bruins [[id number]]  Attending Physician: Marcelyn Bruins [[id number]]          B Medical/Surgery History Past Medical History:  Diagnosis Date   Arthritis    Colon polyp    Hx of gout     Hyperlipidemia    Hypertension    Hyperthyroidism    MVP (mitral valve prolapse)    Renal insufficiency    Thyroid disease    Past Surgical History:  Procedure Laterality Date   BACK SURGERY  2011   LOWER BACK FUSION L4-5   CHOLECYSTECTOMY     microcystic adenoma  02/12   pancreas   NECK SURGERY  2009   NECK FUSION C5-6; C6-7   TOTAL HIP ARTHROPLASTY  01/15/2012   Procedure: TOTAL HIP ARTHROPLASTY;  Surgeon: Gearlean Alf, MD;  Location: [facility];  Service: Orthopedics;  Laterality: Right;   TOTAL HIP ARTHROPLASTY  05/22/2012   Procedure: TOTAL HIP ARTHROPLASTY;  Surgeon: Gearlean Alf, MD;  Location: [facility];  Service: Orthopedics;  Laterality: Left;     A IV Location/Drains/Wounds Patient Lines/Drains/Airways Status     Active Line/Drains/Airways     Name Placement date Placement time Site Days   Peripheral IV 11/11/22 Anterior;Left;Proximal Forearm 11/11/22  1015  Forearm  1   Peripheral IV 11/11/22 22 G 1" Left;Proximal;Posterior Forearm 11/11/22  1552  Forearm  1   Closed System Drain Left Hip Accordion (Hemovac) 10 Fr. 05/22/12  1015  Hip  3826   External Urinary Catheter 06/10/22  1243  --  155   Incision 01/15/12 Hip Right 01/15/12  1540  -- 3954   Incision 05/22/12 Hip Left 05/22/12  0907  -- 3826            Intake/Output Last 24 hours  Intake/Output Summary (Last 24 hours) at 11/12/2022 1716 Last data filed  at 11/12/2022 0331 Gross per 24 hour  Intake 65.58 ml  Output --  Net 65.58 ml    Labs/Imaging Results for orders placed or performed during the hospital encounter of 11/11/22 (from the past 48 hour(s))  Urinalysis, Routine w reflex microscopic Urine, In & Out Cath     Status: None   Collection Time: 11/11/22 10:15 AM  Result Value Ref Range   Color, Urine YELLOW YELLOW   APPearance CLEAR CLEAR   Specific Gravity, Urine 1.010 1.005 - 1.030   pH 7.5 5.0 - 8.0   Glucose, UA NEGATIVE NEGATIVE mg/dL   Hgb urine dipstick NEGATIVE NEGATIVE    Bilirubin Urine NEGATIVE NEGATIVE   Ketones, ur NEGATIVE NEGATIVE mg/dL   Protein, ur NEGATIVE NEGATIVE mg/dL   Nitrite NEGATIVE NEGATIVE   Leukocytes,Ua NEGATIVE NEGATIVE    Comment: Microscopic not done on urines with negative protein, blood, leukocytes, nitrite, or glucose < 500 mg/dL. Performed at [facility], [place] [address] [place], [place] [address]   Lactic acid, plasma     Status: Abnormal   Collection Time: 11/11/22 10:25 AM  Result Value Ref Range   Lactic Acid, Venous 2.5 (HH) 0.5 - 1.9 mmol/L    Comment: CRITICAL RESULT CALLED TO, READ BACK BY AND VERIFIED WITH C.WILLIAMS,RN 1137 11/11/22 CLARK,S Performed at [facility], [place] [address] [place], [place] [address]   CBC with Differential     Status: Abnormal   Collection Time: 11/11/22 10:29 AM  Result Value Ref Range   WBC 20.9 (H) 4.0 - 10.5 K/uL   RBC 5.88 (H) 3.87 - 5.11 MIL/uL   Hemoglobin 16.9 (H) 12.0 - 15.0 g/dL   HCT 50.1 (H) 36.0 - 46.0 %   MCV 85.2 80.0 - 100.0 fL   MCH 28.7 26.0 - 34.0 pg   MCHC 33.7 30.0 - 36.0 g/dL   RDW 13.2 11.5 - 15.5 %   Platelets 230 150 - 400 K/uL   nRBC 0.0 0.0 - 0.2 %   Neutrophils Relative % 81 %   Neutro Abs 16.9 (H) 1.7 - 7.7 K/uL   Lymphocytes Relative 11 %   Lymphs Abs 2.3 0.7 - 4.0 K/uL   Monocytes Relative 7 %   Monocytes Absolute 1.4 (H) 0.1 - 1.0 K/uL   Eosinophils Relative 0 %   Eosinophils Absolute 0.0 0.0 - 0.5 K/uL   Basophils Relative 0 %   Basophils Absolute 0.0 0.0 - 0.1 K/uL   Immature Granulocytes 1 %   Abs Immature Granulocytes 0.17 (H) 0.00 - 0.07 K/uL    Comment: Performed at [place] [address] [place], [place] [address]  Comprehensive metabolic panel     Status: Abnormal   Collection Time: 11/11/22 10:29 AM  Result Value Ref Range   Sodium 142 135 - 145 mmol/L   Potassium 4.3 3.5 - 5.1 mmol/L   Chloride 105 98 - 111 mmol/L   CO2 19 (L) 22 - 32 mmol/L   Glucose, Bld 157 (H) 70 - 99 mg/dL    Comment: Glucose  reference range applies only to samples taken after fasting for at least 8 hours.   BUN 46 (H) 8 - 23 mg/dL   Creatinine, Ser 1.42 (H) 0.44 - 1.00 mg/dL   Calcium 10.9 (H) 8.9 - 10.3 mg/dL   Total Protein 7.1 6.5 - 8.1 g/dL   Albumin 3.3 (L) 3.5 - 5.0 g/dL   AST 45 (H) 15 - 41 U/L   ALT  39 0 - 44 U/L   Alkaline Phosphatase 83 38 - 126 U/L   Total Bilirubin 0.8 0.3 - 1.2 mg/dL   GFR, Estimated 37 (L) >60 mL/min    Comment: (NOTE) Calculated using the CKD-EPI Creatinine Equation (2021)    Anion gap 18 (H) 5 - 15    Comment: Performed at [place] [address] [place], [place] [address]  CK     Status: Abnormal   Collection Time: 11/11/22 10:29 AM  Result Value Ref Range   Total CK 514 (H) 38 - 234 U/L    Comment: Performed at [facility], [place] [address], [place], [place] [address]  TSH     Status: None   Collection Time: 11/11/22 10:29 AM  Result Value Ref Range   TSH 4.135 0.350 - 4.500 uIU/mL    Comment: Performed by a 3rd Generation assay with a functional sensitivity of <=0.01 uIU/mL. Performed at [facility], [place] [address] [place], [place] [address]   Lactic acid, plasma     Status: Abnormal   Collection Time: 11/11/22 12:46 PM  Result Value Ref Range   Lactic Acid, Venous 2.1 (HH) 0.5 - 1.9 mmol/L    Comment: CRITICAL VALUE NOTED. VALUE IS CONSISTENT WITH PREVIOUSLY REPORTED/CALLED VALUE Performed at [name], [place] [address] [place], [place] [address]   Comprehensive metabolic panel     Status: Abnormal   Collection Time: 11/12/22  3:30 AM  Result Value Ref Range   Sodium 139 135 - 145 mmol/L   Potassium 3.6 3.5 - 5.1 mmol/L   Chloride 108 98 - 111 mmol/L   CO2 20 (L) 22 - 32 mmol/L   Glucose, Bld 124 (H) 70 - 99 mg/dL    Comment: Glucose reference range applies only to samples taken after fasting for at least 8 hours.   BUN 57 (H) 8 - 23 mg/dL   Creatinine, Ser 1.64 (H) 0.44 - 1.00 mg/dL   Calcium 9.2 8.9 - 10.3 mg/dL   Total  Protein 5.3 (L) 6.5 - 8.1 g/dL   Albumin 2.6 (L) 3.5 - 5.0 g/dL   AST 27 15 - 41 U/L   ALT 31 0 - 44 U/L   Alkaline Phosphatase 56 38 - 126 U/L   Total Bilirubin 0.8 0.3 - 1.2 mg/dL   GFR, Estimated 31 (L) >60 mL/min    Comment: (NOTE) Calculated using the CKD-EPI Creatinine Equation (2021)    Anion gap 11 5 - 15    Comment: Performed at [facility], [place] [address] [place], [place] [address]  CBC     Status: Abnormal   Collection Time: 11/12/22  3:30 AM  Result Value Ref Range   WBC 14.5 (H) 4.0 - 10.5 K/uL   RBC 4.89 3.87 - 5.11 MIL/uL   Hemoglobin 14.0 12.0 - 15.0 g/dL   HCT 43.0 36.0 - 46.0 %   MCV 87.9 80.0 - 100.0 fL   MCH 28.6 26.0 - 34.0 pg   MCHC 32.6 30.0 - 36.0 g/dL   RDW 13.4 11.5 - 15.5 %   Platelets 169 150 - 400 K/uL   nRBC 0.0 0.0 - 0.2 %    Comment: Performed at [facility], [place] [address], [place], [place] [address]   ECHOCARDIOGRAM COMPLETE  Result Date: 11/12/2022    ECHOCARDIOGRAM REPORT   Patient Name:   [name] Date of Exam: 11/12/2022 Medical Rec #:  [id number]  Height:       64.0 in Accession #:    [id number]   Weight:       170.0 lb Date of Birth:  [birth year]    BSA:          1.826 m Patient Age:    70 years     BP:           122/61 mmHg Patient Gender: F            HR:           63 bpm. Exam Location:  Inpatient Procedure: 2D Echo, Color Doppler, Cardiac Doppler and Intracardiac            Opacification Agent Indications:    Atrial Fibrillation I48.91  History:        Patient has prior history of Echocardiogram examinations, most                 recent 06/06/2022. Arrythmias:Atrial Fibrillation,                 Signs/Symptoms:Syncope; Risk Factors:Dyslipidemia and                 Hypertension.  Sonographer:    Greer Pickerel Referring Phys: Neva Seat, B  Sonographer Comments: Image acquisition challenging due to respiratory motion and Image acquisition challenging due to patient body habitus. IMPRESSIONS  1. Left ventricular ejection  fraction, by estimation, is 60 to 65%. The left ventricle has normal function. The left ventricle has no regional wall motion abnormalities. There is moderate concentric left ventricular hypertrophy. Left ventricular diastolic parameters are consistent with Grade I diastolic dysfunction (impaired relaxation).  2. Right ventricular systolic function is normal. The right ventricular size is normal.  3. The mitral valve is normal in structure. No evidence of mitral valve regurgitation. No evidence of mitral stenosis.  4. The aortic valve is tricuspid. Aortic valve regurgitation is not visualized. No aortic stenosis is present.  5. There is borderline dilatation of the aortic root, measuring 38 mm.  6. The inferior vena cava is normal in size with greater than 50% respiratory variability, suggesting right atrial pressure of 3 mmHg. FINDINGS  Left Ventricle: Left ventricular ejection fraction, by estimation, is 60 to 65%. The left ventricle has normal function. The left ventricle has no regional wall motion abnormalities. Definity contrast agent was given IV to delineate the left ventricular  endocardial borders. The left ventricular internal cavity size was normal in size. There is moderate concentric left ventricular hypertrophy. Left ventricular diastolic parameters are consistent with Grade I diastolic dysfunction (impaired relaxation). Normal left ventricular filling pressure. Right Ventricle: The right ventricular size is normal. No increase in right ventricular wall thickness. Right ventricular systolic function is normal. Left Atrium: Left atrial size was normal in size. Right Atrium: Right atrial size was normal in size. Pericardium: There is no evidence of pericardial effusion. Mitral Valve: The mitral valve is normal in structure. No evidence of mitral valve regurgitation. No evidence of mitral valve stenosis. Tricuspid Valve: The tricuspid valve is normal in structure. Tricuspid valve regurgitation is not  demonstrated. No evidence of tricuspid stenosis. Aortic Valve: The aortic valve is tricuspid. Aortic valve regurgitation is not visualized. No aortic stenosis is present. Pulmonic Valve: The pulmonic valve was normal in structure. Pulmonic valve regurgitation is not visualized. No evidence of pulmonic stenosis. Aorta: The aortic root and ascending aorta are structurally normal, with no evidence of dilitation. There is borderline dilatation of the aortic root,  measuring 38 mm. Venous: The inferior vena cava is normal in size with greater than 50% respiratory variability, suggesting right atrial pressure of 3 mmHg. IAS/Shunts: The interatrial septum was not well visualized.  LEFT VENTRICLE PLAX 2D LVIDd:         3.50 cm   Diastology LVIDs:         2.20 cm   LV e' medial:    6.74 cm/s LV PW:         1.70 cm   LV E/e' medial:  8.5 LV IVS:        1.40 cm   LV e' lateral:   5.44 cm/s LVOT diam:     2.00 cm   LV E/e' lateral: 10.6 LV SV:         68 LV SV Index:   37 LVOT Area:     3.14 cm  RIGHT VENTRICLE RV S prime:     14.00 cm/s TAPSE (M-mode): 2.1 cm LEFT ATRIUM           Index        RIGHT ATRIUM        Index LA diam:      2.20 cm 1.20 cm/m   RA Area:     19 cm LA Vol (A2C): 41.9 ml 22.95 ml/m  RA Volume:   49 ml LA Vol (A4C): 33.2 ml 18.18 ml/m  AORTIC VALVE LVOT Vmax:   92.00 cm/s LVOT Vmean:  63.700 cm/s LVOT VTI:    0.216 m  AORTA Ao Root diam: 3.80 cm Ao Asc diam:  3.60 cm MITRAL VALVE MV Area (PHT): 3.27 cm    SHUNTS MV Decel Time: 232 msec    Systemic VTI:  0.22 m MV E velocity: 57.60 cm/s  Systemic Diam: 2.00 cm MV A velocity: 92.00 cm/s MV E/A ratio:  0.63 Mihai Croitoru MD Electronically signed by Sanda Klein MD Signature Date/Time: 11/12/2022/2:43:47 PM    Final    DG HIP UNILAT WITH PELVIS 2-3 VIEWS LEFT  Result Date: 11/12/2022 CLINICAL DATA:  Left hip pain after fall. EXAM: DG HIP (WITH OR WITHOUT PELVIS) 2-3V LEFT COMPARISON:  Left hip x-rays dated May 22, 2012, and May 20, 2012. FINDINGS:  No acute fracture or dislocation. Prior bilateral total hip arthroplasties. No evidence of hardware failure or loosening. Progressive bilateral heterotopic ossification nearly bridging the acetabula and greater trochanters. Prior lower lumbar fusion. Soft tissues are unremarkable. IMPRESSION: 1. No acute osseous abnormality. 2. Prior bilateral total hip arthroplasties with progressive bilateral heterotopic ossification. Electronically Signed   By: Titus Dubin M.D.   On: 11/12/2022 10:01   DG Shoulder Right Port  Result Date: 11/11/2022 CLINICAL DATA:  Right shoulder dislocation. EXAM: RIGHT SHOULDER - 1 VIEW COMPARISON:  Same day. FINDINGS: There is been successful reduction of right glenohumeral joint dislocation noted on prior exam. No definite fracture is noted. Degenerative changes are seen involving the acromioclavicular and glenohumeral joints. IMPRESSION: Successful reduction of right glenohumeral joint dislocation. Electronically Signed   By: Marijo Conception M.D.   On: 11/11/2022 14:48   CT Head Wo Contrast  Result Date: 11/11/2022 CLINICAL DATA:  Trauma EXAM: CT HEAD WITHOUT CONTRAST CT CERVICAL SPINE WITHOUT CONTRAST TECHNIQUE: Multidetector CT imaging of the head and cervical spine was performed following the standard protocol without intravenous contrast. Multiplanar CT image reconstructions of the cervical spine were also generated. RADIATION DOSE REDUCTION: This exam was performed according to the departmental dose-optimization program which includes automated exposure control, adjustment  of the mA and/or kV according to patient size and/or use of iterative reconstruction technique. COMPARISON:  MRI Brain 06/07/22, CT head 06/06/22 FINDINGS: CT HEAD FINDINGS Brain: No evidence of acute infarction, hemorrhage, hydrocephalus, extra-axial collection or mass lesion/mass effect. Sequela of moderate chronic microvascular ischemic change. Advanced generalized volume loss. Vascular: No hyperdense  vessel or unexpected calcification. Skull: Normal. Negative for fracture or focal lesion. Sinuses/Orbits: Normal orbits. Air-fluid level in the left maxillary sinus. Other: None. CT CERVICAL SPINE FINDINGS Alignment: Straightening of the normal cervical lordosis. Skull base and vertebrae: Postsurgical changes from C5-C7 ACDF with solid osseous fusion. No evidence of perihardware lucency. Soft tissues and spinal canal: No prevertebral fluid or swelling. No visible canal hematoma. Disc levels:  No evidence of high-grade spinal canal stenosis. Upper chest: Negative. Other: None IMPRESSION: 1. No acute intracranial abnormality. 2. No acute fracture or traumatic malalignment of the cervical spine. Electronically Signed   By: Marin Roberts M.D.   On: 11/11/2022 12:45   CT Cervical Spine Wo Contrast  Result Date: 11/11/2022 CLINICAL DATA:  Trauma EXAM: CT HEAD WITHOUT CONTRAST CT CERVICAL SPINE WITHOUT CONTRAST TECHNIQUE: Multidetector CT imaging of the head and cervical spine was performed following the standard protocol without intravenous contrast. Multiplanar CT image reconstructions of the cervical spine were also generated. RADIATION DOSE REDUCTION: This exam was performed according to the departmental dose-optimization program which includes automated exposure control, adjustment of the mA and/or kV according to patient size and/or use of iterative reconstruction technique. COMPARISON:  MRI Brain 06/07/22, CT head 06/06/22 FINDINGS: CT HEAD FINDINGS Brain: No evidence of acute infarction, hemorrhage, hydrocephalus, extra-axial collection or mass lesion/mass effect. Sequela of moderate chronic microvascular ischemic change. Advanced generalized volume loss. Vascular: No hyperdense vessel or unexpected calcification. Skull: Normal. Negative for fracture or focal lesion. Sinuses/Orbits: Normal orbits. Air-fluid level in the left maxillary sinus. Other: None. CT CERVICAL SPINE FINDINGS Alignment: Straightening of the  normal cervical lordosis. Skull base and vertebrae: Postsurgical changes from C5-C7 ACDF with solid osseous fusion. No evidence of perihardware lucency. Soft tissues and spinal canal: No prevertebral fluid or swelling. No visible canal hematoma. Disc levels:  No evidence of high-grade spinal canal stenosis. Upper chest: Negative. Other: None IMPRESSION: 1. No acute intracranial abnormality. 2. No acute fracture or traumatic malalignment of the cervical spine. Electronically Signed   By: Marin Roberts M.D.   On: 11/11/2022 12:45   DG Wrist Complete Right  Result Date: 11/11/2022 CLINICAL DATA:  Fall, injury, pain, right shoulder dislocation EXAM: RIGHT WRIST - COMPLETE 3+ VIEW COMPARISON:  11/11/2022 FINDINGS: bones are osteopenic. Diffuse degenerative changes throughout the wrist joint with joint space loss, sclerosis and bony spurring of the distal radius, carpal bones, and the proximal metacarpals. No acute osseous finding or fracture. Normal alignment. No focal soft tissue abnormality. Peripheral atherosclerosis present. IMPRESSION: 1. Osteopenia and degenerative changes as above. 2. No acute osseous finding by plain radiography. Electronically Signed   By: Jerilynn Mages.  Shick M.D.   On: 11/11/2022 12:20   DG Shoulder Right  Result Date: 11/11/2022 CLINICAL DATA:  Fall, shoulder pain EXAM: RIGHT SHOULDER - 2+ VIEW COMPARISON:  11/11/2022 FINDINGS: Anterior inferior right shoulder dislocation. Bones are osteopenic. AC joint degenerative change noted without separation. No definite acute osseous finding or displaced fracture. Included right chest unremarkable. IMPRESSION: Anterior inferior right shoulder dislocation. Electronically Signed   By: Jerilynn Mages.  Shick M.D.   On: 11/11/2022 12:19   DG Elbow 2 Views Right  Result Date: 11/11/2022 CLINICAL  DATA:  fall, right shoulder dislocation EXAM: RIGHT ELBOW - 2 VIEW COMPARISON:  11/11/2022 FINDINGS: Limited two-view exam. No displaced fracture or malalignment. No large  effusion. Extremity soft tissue swelling suspected. IMPRESSION: Limited two-view exam. No acute finding by plain radiography. Electronically Signed   By: Jerilynn Mages.  Shick M.D.   On: 11/11/2022 12:18   DG Chest 1 View  Result Date: 11/11/2022 CLINICAL DATA:  Fall, right shoulder pain EXAM: CHEST  1 VIEW COMPARISON:  06/05/2022 FINDINGS: Anterior inferior right shoulder dislocation again noted. Degenerative changes throughout the spine and both shoulders. Similar elevation the right hemidiaphragm. Heart is enlarged without CHF or pneumonia. No effusion or pneumothorax. Aorta atherosclerotic. Lower cervical fusion hardware noted. IMPRESSION: Right shoulder dislocation. Stable chest exam. Electronically Signed   By: Jerilynn Mages.  Shick M.D.   On: 11/11/2022 12:16   DG Humerus Right  Result Date: 11/11/2022 CLINICAL DATA:  Right shoulder pain, fall EXAM: RIGHT HUMERUS - 2+ VIEW COMPARISON:  11/11/2022 FINDINGS: Anterior inferior dislocation of the right shoulder joint. AC joint degenerative change noted. Intact right humerus. No acute osseous finding or fracture. IMPRESSION: Right shoulder dislocation. Electronically Signed   By: Jerilynn Mages.  Shick M.D.   On: 11/11/2022 12:15    Pending Labs Unresulted Labs (From admission, onward)     Start     Ordered   11/13/22 0500  CBC with Differential/Platelet  Tomorrow morning,   R        11/12/22 0903   11/13/22 0500  CK  Tomorrow morning,   R        11/12/22 0903   11/13/22 1245  Basic metabolic panel  Tomorrow morning,   R        11/12/22 0903            Vitals/Pain Today's Vitals   11/12/22 1630 11/12/22 1645 11/12/22 1700 11/12/22 1710  BP: (!) 126/56 135/67 (!) 140/94   Pulse: (!) 58 64 79   Resp: '19 16 15   '$ Temp:    98.6 F (37 C)  TempSrc:    Oral  SpO2: 95% 94% 95%   Weight:      Height:      PainSc:        Isolation Precautions No active isolations  Medications Medications  levothyroxine (SYNTHROID) tablet 50 mcg (50 mcg Oral Given 11/12/22 0557)   apixaban (ELIQUIS) tablet 5 mg (5 mg Oral Given 11/12/22 1002)  sodium chloride flush (NS) 0.9 % injection 3 mL (3 mLs Intravenous Given 11/12/22 1008)  acetaminophen (TYLENOL) tablet 650 mg (has no administration in time range)    Or  acetaminophen (TYLENOL) suppository 650 mg (has no administration in time range)  polyethylene glycol (MIRALAX / GLYCOLAX) packet 17 g (has no administration in time range)  lactated ringers infusion ( Intravenous New Bag/Given 11/12/22 1007)  perflutren lipid microspheres (DEFINITY) IV suspension (2 mLs Intravenous Given 11/12/22 1213)  metoprolol tartrate (LOPRESSOR) tablet 25 mg (25 mg Oral Given 11/12/22 1707)  allopurinol (ZYLOPRIM) tablet 100 mg (100 mg Oral Given 11/12/22 1706)  fentaNYL (SUBLIMAZE) injection 50 mcg (50 mcg Intravenous Given 11/11/22 1015)  lactated ringers bolus 1,000 mL (0 mLs Intravenous Stopped 11/11/22 2122)  HYDROmorphone (DILAUDID) injection 0.5 mg (0.5 mg Intravenous Given 11/11/22 1111)  lidocaine-EPINEPHrine (XYLOCAINE W/EPI) 2 %-1:200000 (PF) injection 10 mL (10 mLs Peri-NEURAL Given by Other 11/11/22 1324)  HYDROmorphone (DILAUDID) injection 0.5 mg (0.5 mg Intravenous Given 11/11/22 1323)  lactated ringers bolus 500 mL (0 mLs Intravenous Stopped 11/11/22 2123)  Mobility walks Moderate fall risk   Focused Assessments     R Recommendations: See Admitting Provider Note  Report given to:   Additional Notes:

## 2022-11-12 NOTE — Progress Notes (Signed)
[facility] night cross cover note:   I was notified by RN that the patient had spontaneously converted from atrial fibrillation to sinus rhythm and has subsequently remained on diltiazem drip.  After confirming that the patient has already received her home beta-blocker, I requested the Cardizem drip be discontinued at this time.     Babs Bertin, DO Hospitalist

## 2022-11-12 NOTE — ED Notes (Signed)
Updated daughter over the phone.

## 2022-11-12 NOTE — ED Notes (Signed)
Patient's gown noted to be wet  from where patient removed her IV and IV fluids got on gown.  Patient's gown changed.

## 2022-11-12 NOTE — ED Notes (Signed)
Patient denies pain and is resting comfortably, snoring loudly.

## 2022-11-12 NOTE — Progress Notes (Addendum)
[facility] PROGRESS NOTE  [name] (DOB: [birth year]) VQQ:[id number] PCP: Merrilee Seashore, MD  Brief Narrative: [name] is an 81 y.o. female with a history of stage IIIb CKD, HTN, HLD, pancreatic adenoma and insufficiency, low back pain, hypothyroidism who was brought by EMS to the ED on 11/11/2022 after being found down, confused at home on a welfare check, last seen normal 11/22 by neighbors. She had been found to be in AFib (no prior diagnosis) with right shoulder dislocation. Labs were hemoconcentrated with AKI, leukocytosis, CK elevated to 514, UA normal. Dislocation was confirmed radiographically and reduced in ED. CT head and cervical spine showed no intracranial abnormality, fracture or subluxation. She also complained of left hip pain for which XR was performed showing no acute changes, stable hardware s/p bilateral THA's and lumbar fusion. IV fluids and diltiazem infusion initiated and she was admitted. Subsequently converted to NSR, restarted on beta blocker.   Subjective: Just wants to sleep and be left alone. On exam she confirms there's left hip pain (points to lateral upper thigh) that is worse with movements. Also hurts all over. Doesn't recall events surrounding fall, has been living alone.  Objective: BP (!) 106/55   Pulse 60   Temp 98.3 F (36.8 C) (Oral)   Resp 19   Ht '5\' 4"'$  (1.626 m)   Wt 77.1 kg   SpO2 95%   BMI 29.18 kg/m   Gen: Frail elderly female in no distress.  Pulm: Clear, nonlabored  CV: RRR, NSR on monitor, confirmed by personal review of ECG from 3:19am as well. No significant dependent edema. GI: Soft, NT, ND, +BS  Neuro: Alert and oriented to place, person, situation to some degree. Moves all extremities minimally, not cooperative with full exam. No dysarthria noted. Ext: Warm, no deformities. LLE with normal alignment, length. +TTP along most muscle groups in legs and arms without visible or palpable deformities. R shoulder edematous in  sling, no ecchymosis noted  Assessment & Plan: Principal Problem:   Atrial fibrillation with RVR (HCC) Active Problems:   Hyperlipidemia   Hypertension   Exocrine pancreatic insufficiency   Hypothyroidism   Chronic kidney disease, stage 3b ([employer])   Fall at home, initial encounter  New onset PAF with RVR: Converted to NSR with dilt gtt.  - Continue beta blocker, switching to metoprolol for now.  - Continue cardiac monitoring - Echocardiogram - Started eliquis. CHA2DS2-VASc score is 4 (HTN, age, sex)   Fall at home: Unclear etiology. Note admission in June 2023 for syncope, felt vasovagal vs. orthostatic.  - PT/OT - Orthostatic vital signs when able - Continue cardiac monitoring  Right shoulder location: s/p reduction in the ED with adequate reduction confirmed by XR. Wrist, elbow XR nonacute. - Sling.   Leukocytosis: Unclear whether reactive or due to infection as no source has been found. Improving with IV fluids without antimicrobial therapy, remains afebrile.  - Monitor off abx, check culture and pursue further work up if fever develops.   AKI on stage IIIb CKD: Creatinine near baseline on admission, rising on 11/28. CK modestly elevated with no myoglobinuria on dipstick UA. IV fluids were not continued overnight.  - Restart IV fluids. - Avoid hypotension and nephrotoxins as able.    Lactic acidosis: Improving - LR at 100cc/hr, maintain BP.  Degenerative joint disease: Widespread with multiple orthopedic surgical interventions.  - Pain control.   Hypothyroidism: TSH 4.135 - Continue home synthroid   Hypotension on essential HTN: BP low - Change coreg  to metoprolol tartrate for now, continue holding norvasc.     Gout:  - Continue home allopurinol (started last hospitalization)  Depression/anxiety:  - Will await confirmation of med rec prior to reordering sertraline and wellbutrin.   Patrecia Pour, MD [facility] [email or web address] 11/12/2022, 2:20 PM

## 2022-11-12 NOTE — Evaluation (Signed)
Occupational Therapy Evaluation Patient Details Name: [name] MRN: [id number] DOB: 1941/12/16 Today's Date: 11/12/2022   History of Present Illness 81 yo female presents to [facility] on 11/27 s/p found down at home. x-ray of the right humerus which showed an anterior inferior dislocation without associated fracture s/p reduction in ED. Workup for afib with RVR, developing confusion. x-ray of the right wrist and elbow, as well as CXR negative for acute findings. PMH includes CKD 3B, hyperlipidemia, hypertension, hypothyroidism, MVP, low back pain, gout, pancreatic adenoma, pancreatic insufficiency, bilat THA 2013.   Clinical Impression   PTA, pt lives alone, typically Modified Independent with ADLs and mobility using RW per chart. Pt presents now with deficits in cognition, sitting balance, R shoulder pain and overall strength. Pt requires Total A x 2 for bed mobility, Mod A for sitting balance and unable to attempt standing due to reports of dizziness/anxiety. Pt requires Max -Total A for ADLs bed level at this time. Rec SNF rehab at DC as pt unsafe to DC home alone without extensive physical assist.      Recommendations for follow up therapy are one component of a multi-disciplinary discharge planning process, led by the attending physician.  Recommendations may be updated based on patient status, additional functional criteria and insurance authorization.   Follow Up Recommendations  Skilled nursing-short term rehab (<3 hours/day)     Assistance Recommended at Discharge Frequent or constant Supervision/Assistance  Patient can return home with the following Two people to help with walking and/or transfers;Two people to help with bathing/dressing/bathroom    Functional Status Assessment  Patient has had a recent decline in their functional status and demonstrates the ability to make significant improvements in function in a reasonable and predictable amount of time.  Equipment  Recommendations  Other (comment) (TBD pending progress)    Recommendations for Other Services       Precautions / Restrictions Precautions Precautions: Fall Required Braces or Orthoses: Sling Restrictions Weight Bearing Restrictions: Yes RUE Weight Bearing: Non weight bearing Other Position/Activity Restrictions: assume NWB      Mobility Bed Mobility Overal bed mobility: Needs Assistance Bed Mobility: Supine to Sit, Sit to Supine     Supine to sit: Total assist, +2 for physical assistance Sit to supine: Total assist, +2 for physical assistance   General bed mobility comments: total +2 all aspects and increased time    Transfers                   General transfer comment: unable to attempt given pt requesting lay back down      Balance Overall balance assessment: Needs assistance Sitting-balance support: Feet supported, Bilateral upper extremity supported Sitting balance-Leahy Scale: Poor Sitting balance - Comments: at least mod truncal assist to maintain upright, pt with R bias Postural control: Right lateral lean                                 ADL either performed or assessed with clinical judgement   ADL Overall ADL's : Needs assistance/impaired Eating/Feeding: Maximal assistance;Bed level Eating/Feeding Details (indicate cue type and reason): had to bring cup/straw to pt's lips, poor initiation Grooming: Bed level;Total assistance   Upper Body Bathing: Total assistance   Lower Body Bathing: Total assistance   Upper Body Dressing : Total assistance   Lower Body Dressing: Total assistance       Toileting- Clothing Manipulation and Hygiene: Total assistance  General ADL Comments: overall Total A for ADLs bed level vs sitting EOB. impaired by cognition     Vision Ability to See in Adequate Light: 1 Impaired Patient Visual Report: Other (comment) (to be further assessed) Vision Assessment?: Vision impaired- to be further  tested in functional context Additional Comments: to be further assessed, pt kept eyes closed much of eval     Perception     Praxis      Pertinent Vitals/Pain Pain Assessment Pain Assessment: Faces Faces Pain Scale: Hurts even more Pain Location: RUE when adjusting sling, generalized during mobility Pain Descriptors / Indicators: Discomfort, Grimacing, Guarding Pain Intervention(s): Monitored during session     Hand Dominance Right   Extremity/Trunk Assessment Upper Extremity Assessment Upper Extremity Assessment: RUE deficits/detail RUE Deficits / Details: R shoulder dislocation. sling under gown on entry - readjusted and repositioned for optimal comfort   Lower Extremity Assessment Lower Extremity Assessment: Defer to PT evaluation   Cervical / Trunk Assessment Cervical / Trunk Assessment: Kyphotic   Communication Communication Communication: Expressive difficulties (mumbling, moaning)   Cognition Arousal/Alertness: Lethargic Behavior During Therapy: Flat affect, Anxious Overall Cognitive Status: Impaired/Different from baseline Area of Impairment: Attention, Following commands, Safety/judgement, Problem solving, Awareness                   Current Attention Level: Focused   Following Commands: Follows one step commands inconsistently, Follows one step commands with increased time Safety/Judgement: Decreased awareness of deficits Awareness: Intellectual Problem Solving: Difficulty sequencing, Requires verbal cues, Slow processing, Decreased initiation, Requires tactile cues General Comments: very limited by anxiety, poor command following     General Comments  brother in law called during session so pt could talk to sister on phone - did appear more alert and appropriate in conversation with sister at end of session    Exercises     Shoulder Instructions      Home Living Family/patient expects to be discharged to:: Private residence Living  Arrangements: Alone Available Help at Discharge: Neighbor;Available PRN/intermittently Type of Home: House Home Access: Stairs to enter [employer] of Steps: 1   Home Layout: One level     Bathroom Shower/Tub: Teacher, early years/pre: Standard     Home Equipment: Cane - single Barista (2 wheels)   Additional Comments: home setup from prior stay as pt unable to report on eval      Prior Functioning/Environment Prior Level of Function : Independent/Modified Independent;History of Falls (last six months)             Mobility Comments: Pt states "yes" to using RW PTA, otherwise most information gathered from chart review as pt is a poor historian this date ADLs Comments: pt does not state        OT Problem List: Decreased strength;Decreased activity tolerance;Impaired balance (sitting and/or standing);Decreased cognition;Pain;Decreased knowledge of use of DME or AE;Decreased safety awareness      OT Treatment/Interventions: Self-care/ADL training;Therapeutic exercise;Energy conservation;DME and/or AE instruction;Therapeutic activities;Patient/family education    OT Goals(Current goals can be found in the care plan section) Acute Rehab OT Goals Patient Stated Goal: to lay back down OT Goal Formulation: Patient unable to participate in goal setting Time For Goal Achievement: 11/26/22 Potential to Achieve Goals: Good  OT Frequency: Min 2X/week    Co-evaluation PT/OT/SLP Co-Evaluation/Treatment: Yes Reason for Co-Treatment: To address functional/ADL transfers;For patient/therapist safety;Necessary to address cognition/behavior during functional activity PT goals addressed during session: Mobility/safety with mobility;Balance OT goals addressed during session:  ADL's and self-care      AM-PAC OT "6 Clicks" Daily Activity     Outcome Measure Help from another person eating meals?: A Lot Help from another person taking care of personal  grooming?: A Lot Help from another person toileting, which includes using toliet, bedpan, or urinal?: Total Help from another person bathing (including washing, rinsing, drying)?: Total Help from another person to put on and taking off regular upper body clothing?: Total Help from another person to put on and taking off regular lower body clothing?: Total 6 Click Score: 8   End of Session Nurse Communication: Mobility status  Activity Tolerance: Patient limited by pain;Other (comment) (limited by cognition) Patient left: in bed;with call bell/phone within reach  OT Visit Diagnosis: Unsteadiness on feet (R26.81);Other abnormalities of gait and mobility (R26.89)                Time: 6629-4765 OT Time Calculation (min): 16 min Charges:  OT General Charges $OT Visit: 1 Visit OT Evaluation $OT Eval Moderate Complexity: 1 Mod  Malachy Chamber, OTR/L Acute Rehab Services Office: [phone]   Layla Maw 11/12/2022, 10:27 AM

## 2022-11-12 NOTE — ED Notes (Signed)
Pt in room c/o L hip pain. Pt A&O x3. Pt appears unkept at this time. Updated on plan of care. No obvious deformity to L hip. Pulses present distally. MD made aware.

## 2022-11-12 NOTE — ED Notes (Signed)
Patient awakened for morning medication, patient woke up reporting "I can't open my eyes". Confused where she is at and what had happened to her. RN attempted to reorient patient. Patient linen and changed and placed on purwick. Instructed patient to maintain injured arm in the sling. Pt verbalized understanding.

## 2022-11-13 ENCOUNTER — Observation Stay (HOSPITAL_COMMUNITY): Payer: Medicare HMO

## 2022-11-13 DIAGNOSIS — I4891 Unspecified atrial fibrillation: Secondary | ICD-10-CM | POA: Diagnosis not present

## 2022-11-13 DIAGNOSIS — R55 Syncope and collapse: Secondary | ICD-10-CM

## 2022-11-13 LAB — CBC WITH DIFFERENTIAL/PLATELET
Abs Immature Granulocytes: 0.07 10*3/uL (ref 0.00–0.07)
Basophils Absolute: 0 10*3/uL (ref 0.0–0.1)
Basophils Relative: 0 %
Eosinophils Absolute: 0.1 10*3/uL (ref 0.0–0.5)
Eosinophils Relative: 1 %
HCT: 33.7 % — ABNORMAL LOW (ref 36.0–46.0)
Hemoglobin: 11.4 g/dL — ABNORMAL LOW (ref 12.0–15.0)
Immature Granulocytes: 1 %
Lymphocytes Relative: 19 %
Lymphs Abs: 1.9 10*3/uL (ref 0.7–4.0)
MCH: 29.3 pg (ref 26.0–34.0)
MCHC: 33.8 g/dL (ref 30.0–36.0)
MCV: 86.6 fL (ref 80.0–100.0)
Monocytes Absolute: 0.9 10*3/uL (ref 0.1–1.0)
Monocytes Relative: 9 %
Neutro Abs: 6.9 10*3/uL (ref 1.7–7.7)
Neutrophils Relative %: 70 %
Platelets: 151 10*3/uL (ref 150–400)
RBC: 3.89 MIL/uL (ref 3.87–5.11)
RDW: 13.3 % (ref 11.5–15.5)
WBC: 9.9 10*3/uL (ref 4.0–10.5)
nRBC: 0 % (ref 0.0–0.2)

## 2022-11-13 LAB — BASIC METABOLIC PANEL
Anion gap: 9 (ref 5–15)
BUN: 46 mg/dL — ABNORMAL HIGH (ref 8–23)
CO2: 21 mmol/L — ABNORMAL LOW (ref 22–32)
Calcium: 8.6 mg/dL — ABNORMAL LOW (ref 8.9–10.3)
Chloride: 110 mmol/L (ref 98–111)
Creatinine, Ser: 1.19 mg/dL — ABNORMAL HIGH (ref 0.44–1.00)
GFR, Estimated: 46 mL/min — ABNORMAL LOW (ref >60)
Glucose, Bld: 77 mg/dL (ref 70–99)
Potassium: 3.8 mmol/L (ref 3.5–5.1)
Sodium: 140 mmol/L (ref 135–145)

## 2022-11-13 LAB — CK: Total CK: 488 U/L — ABNORMAL HIGH (ref 38–234)

## 2022-11-13 MED ORDER — SERTRALINE HCL 25 MG PO TABS
50.0000 mg | ORAL_TABLET | Freq: Every day | ORAL | Status: DC
  Administered 2022-11-13 – 2022-11-15 (×3): 50 mg via ORAL
  Filled 2022-11-13 (×3): qty 2

## 2022-11-13 MED ORDER — BUPROPION HCL ER (XL) 150 MG PO TB24
150.0000 mg | ORAL_TABLET | Freq: Every day | ORAL | Status: DC
  Administered 2022-11-13 – 2022-11-15 (×3): 150 mg via ORAL
  Filled 2022-11-13 (×3): qty 1

## 2022-11-13 NOTE — NC FL2 (Signed)
[place] MEDICAID FL2 LEVEL OF CARE SCREENING TOOL     IDENTIFICATION  Patient Name: [name] Birthdate: 12/30/1940 Sex: female Admission Date (Current Location): 11/11/2022  [facility] and [place] Number:  Herbalist and Address:  [facility] [place], Register [address], [place], [place] [address]      Provider Number: [id number]  Attending Physician Name and Address:  Domenic Polite, MD  Relative Name and Phone Number:  Tarri Abernethy (Niece)  [phone] (Mobile)    Current Level of Care: Hospital Recommended Level of Care: [place] Prior Approval Number:    Date Approved/Denied:   PASRR Number: [id number]  Discharge Plan: SNF    Current Diagnoses: Patient Active Problem List   Diagnosis Date Noted   Atrial fibrillation with RVR ([place]) 11/11/2022   Fall at home, initial encounter 11/11/2022   B12 deficiency 06/07/2022   Hypothyroidism 06/06/2022   Chronic kidney disease, stage 3b ([place]) 06/06/2022   Low back pain 06/06/2022   QT prolongation 06/06/2022   Hypokalemia 06/06/2022   Syncope and collapse 06/06/2022   Syncope 06/05/2022   Elevated uric acid in blood 11/13/2012   Right thyroid nodule 11/13/2012   Exocrine pancreatic insufficiency 01/07/2012   Hyperlipidemia 08/17/2011   Hypertension 08/17/2011   Osteoarthritis 08/17/2011   Pancreatic adenoma 08/17/2011    Orientation RESPIRATION BLADDER Height & Weight     Self  Normal Incontinent Weight: 170 lb (77.1 kg) Height:  '5\' 4"'$  (162.6 cm)  BEHAVIORAL SYMPTOMS/MOOD NEUROLOGICAL BOWEL NUTRITION STATUS      Continent Diet (see d/c summary)  AMBULATORY STATUS COMMUNICATION OF NEEDS Skin   Extensive Assist Verbally Normal                       Personal Care Assistance Level of Assistance  Bathing, Feeding, Dressing Bathing Assistance: Maximum assistance Feeding assistance: Independent Dressing Assistance: Maximum assistance     Functional Limitations  Info  Sight, Hearing, Speech Sight Info: Adequate Hearing Info: Adequate Speech Info: Adequate    SPECIAL CARE FACTORS FREQUENCY  PT (By licensed PT), OT (By licensed OT)     PT Frequency: 5x/week OT Frequency: 5x/week            Contractures Contractures Info: Not present    Additional Factors Info  Code Status, Allergies Code Status Info: Full code Allergies Info: codeine           Current Medications (11/13/2022):  This is the current hospital active medication list Current Facility-Administered Medications  Medication Dose Route Frequency Provider Last Rate Last Admin   acetaminophen (TYLENOL) tablet 650 mg  650 mg Oral Q6H PRN Marcelyn Bruins, MD       Or   acetaminophen (TYLENOL) suppository 650 mg  650 mg Rectal Q6H PRN Marcelyn Bruins, MD       allopurinol (ZYLOPRIM) tablet 100 mg  100 mg Oral Daily Vance Gather B, MD   100 mg at 11/13/22 0915   apixaban (ELIQUIS) tablet 5 mg  5 mg Oral BID Marcelyn Bruins, MD   5 mg at 11/13/22 0915   buPROPion (WELLBUTRIN XL) 24 hr tablet 150 mg  150 mg Oral Daily Domenic Polite, MD   150 mg at 11/13/22 1207   levothyroxine (SYNTHROID) tablet 50 mcg  50 mcg Oral Daily Marcelyn Bruins, MD   50 mcg at 11/13/22 8469   metoprolol tartrate (LOPRESSOR) tablet 25 mg  25 mg Oral BID Patrecia Pour, MD  25 mg at 11/13/22 0915   polyethylene glycol (MIRALAX / GLYCOLAX) packet 17 g  17 g Oral Daily PRN Marcelyn Bruins, MD       sertraline (ZOLOFT) tablet 50 mg  50 mg Oral Daily Domenic Polite, MD   50 mg at 11/13/22 1207   sodium chloride flush (NS) 0.9 % injection 3 mL  3 mL Intravenous Q12H Marcelyn Bruins, MD   3 mL at 11/13/22 5486     Discharge Medications: Please see discharge summary for a list of discharge medications.  Relevant Imaging Results:  Relevant Lab Results:   Additional Information SS#[id number];[employer] x2  Bethann Berkshire, LCSW

## 2022-11-13 NOTE — Progress Notes (Signed)
PROGRESS NOTE    [name]  EHM:[id number] DOB: 29-Apr-1941 DOA: 11/11/2022 PCP: Merrilee Seashore, MD  81 y.o. female with a history of stage IIIb CKD, HTN, HLD, pancreatic adenoma and insufficiency, low back pain, hypothyroidism who was brought by EMS to the ED on 11/11/2022 after being found down, confused at home on a welfare check, last seen normal 11/22 by neighbors. She had been found to be in AFib (no prior diagnosis) with right shoulder dislocation. Labs were hemoconcentrated with AKI, leukocytosis, CK elevated to 514, UA normal. Dislocation was confirmed radiographically and reduced in ED. other imaging studies were unremarkable. She also complained of left hip pain for which XR was performed showing no acute changes, stable hardware s/p bilateral THA's and lumbar fusion. IV fluids and diltiazem infusion initiated and she was admitted. Subsequently converted to NSR, restarted on beta blocker.    Subjective: Hurting all over, weak, wearing a sling  Assessment and Plan:  New onset PAF with RVR: Converted to NSR with dilt gtt.  - Continue beta blocker, now metoprolol -2D echo with preserved EF, no wall motion abnormality, grade 1 diastolic dysfunction, RV function is preserved - Started eliquis. CHA2DS2-VASc score is 4 (HTN, age, sex) -PT OT eval completed, SNF recommended for short-term rehab -TOC consult   Fall at home: Note admission in June 2023 for syncope, orthostatic is a possibility considering soft BPs, hemoconcentration etc. she is unable to stand up to check orthostatics at this time, has been appropriately hydrated -In addition CK modestly elevated will also check EEG to rule out seizures -Increase activity, orthostatic vitals when able, continue telemetry for today -PT OT, TOC consult for rehab   Right shoulder location: s/p reduction in the ED with adequate reduction confirmed by XR. Wrist, elbow XR nonacute. - Sling.   Leukocytosis: Due to hemoconcentration,  resolved with hydration   AKI on stage IIIb CKD: Creatinine near baseline on admission, rising on 11/28. CK modestly elevated with no myoglobinuria on dipstick UA -Now improving, discontinue IV fluids - Avoid hypotension and nephrotoxins as able.    Lactic acidosis: Improving -Will discontinue IV fluids today   Degenerative joint disease: Widespread with multiple orthopedic surgical interventions.  - Pain control.    Hypothyroidism: TSH 4.135 - Continue home synthroid   Essential hypertension -Blood pressure soft initially, suspect component of dehydration - Change coreg to metoprolol tartrate for now, continue holding norvasc.     Gout:  - Continue home allopurinol (started last hospitalization)   Depression/anxiety:  -Resume Wellbutrin and sertraline     DVT prophylaxis: Apixaban Code Status: Full code Family Communication: Discussed with patient in detail no family at bedside, Disposition Plan: Rehab when bed available  Consultants:    Procedures:   Antimicrobials:    Objective: Vitals:   11/13/22 0305 11/13/22 0732 11/13/22 0915 11/13/22 1100  BP: 131/61 126/66 122/62 120/88  Pulse: (!) 59 61 66 61  Resp: '18 13  14  '$ Temp: 98 F (36.7 C) 97.9 F (36.6 C)  98 F (36.7 C)  TempSrc: Oral Oral  Oral  SpO2:  95%  93%  Weight:      Height:        Intake/Output Summary (Last 24 hours) at 11/13/2022 1133 Last data filed at 11/13/2022 1105 Gross per 24 hour  Intake 1039 ml  Output 400 ml  Net 639 ml   Filed Weights   11/11/22 1023  Weight: 77.1 kg    Examination:  General exam: Appears calm and comfortable  Respiratory system: Clear to auscultation Cardiovascular system: S1 & S2 heard, RRR.  Abd: nondistended, soft and nontender.Normal bowel sounds heard. Central nervous system: Alert and oriented. No focal neurological deficits. Extremities: no edema Skin: No rashes Psychiatry:  Mood & affect appropriate.     Data Reviewed:   CBC: Recent  Labs  Lab 11/11/22 1029 11/12/22 0330 11/13/22 0021  WBC 20.9* 14.5* 9.9  NEUTROABS 16.9*  --  6.9  HGB 16.9* 14.0 11.4*  HCT 50.1* 43.0 33.7*  MCV 85.2 87.9 86.6  PLT 230 169 332   Basic Metabolic Panel: Recent Labs  Lab 11/11/22 1029 11/12/22 0330 11/13/22 0021  NA 142 139 140  K 4.3 3.6 3.8  CL 105 108 110  CO2 19* 20* 21*  GLUCOSE 157* 124* 77  BUN 46* 57* 46*  CREATININE 1.42* 1.64* 1.19*  CALCIUM 10.9* 9.2 8.6*   GFR: Estimated Creatinine Clearance: 36.7 mL/min (A) (by C-G formula based on SCr of 1.19 mg/dL (H)). Liver Function Tests: Recent Labs  Lab 11/11/22 1029 11/12/22 0330  AST 45* 27  ALT 39 31  ALKPHOS 83 56  BILITOT 0.8 0.8  PROT 7.1 5.3*  ALBUMIN 3.3* 2.6*   No results for input(s): "LIPASE", "AMYLASE" in the last 168 hours. No results for input(s): "AMMONIA" in the last 168 hours. Coagulation Profile: No results for input(s): "INR", "PROTIME" in the last 168 hours. Cardiac Enzymes: Recent Labs  Lab 11/11/22 1029 11/13/22 0021  CKTOTAL 514* 488*   BNP (last 3 results) No results for input(s): "PROBNP" in the last 8760 hours. HbA1C: No results for input(s): "HGBA1C" in the last 72 hours. CBG: No results for input(s): "GLUCAP" in the last 168 hours. Lipid Profile: No results for input(s): "CHOL", "HDL", "LDLCALC", "TRIG", "CHOLHDL", "LDLDIRECT" in the last 72 hours. Thyroid Function Tests: Recent Labs    11/11/22 1029  TSH 4.135   Anemia Panel: No results for input(s): "VITAMINB12", "FOLATE", "FERRITIN", "TIBC", "IRON", "RETICCTPCT" in the last 72 hours. Urine analysis:    Component Value Date/Time   COLORURINE YELLOW 11/11/2022 1015   APPEARANCEUR CLEAR 11/11/2022 1015   LABSPEC 1.010 11/11/2022 1015   PHURINE 7.5 11/11/2022 1015   GLUCOSEU NEGATIVE 11/11/2022 1015   HGBUR NEGATIVE 11/11/2022 1015   BILIRUBINUR NEGATIVE 11/11/2022 1015   BILIRUBINUR NEG 12/25/2020 1428   KETONESUR NEGATIVE 11/11/2022 1015   PROTEINUR  NEGATIVE 11/11/2022 1015   UROBILINOGEN 0.2 12/25/2020 1428   UROBILINOGEN 1.0 05/15/2015 2049   NITRITE NEGATIVE 11/11/2022 1015   LEUKOCYTESUR NEGATIVE 11/11/2022 1015   Sepsis Labs: '@LABRCNTIP'$ (procalcitonin:4,lacticidven:4)  ) Recent Results (from the past 240 hour(s))  MRSA Next Gen by PCR, Nasal     Status: None   Collection Time: 11/12/22 10:21 PM   Specimen: Nasal Mucosa; Nasal Swab  Result Value Ref Range Status   MRSA by PCR Next Gen NOT DETECTED NOT DETECTED Final    Comment: (NOTE) The GeneXpert MRSA Assay (FDA approved for NASAL specimens only), is one component of a comprehensive MRSA colonization surveillance program. It is not intended to diagnose MRSA infection nor to guide or monitor treatment for MRSA infections. Test performance is not FDA approved in patients less than 60 years old. Performed at [facility], [place] [address], [place], [place] [address]      Radiology Studies:   ECHOCARDIOGRAM COMPLETE  Result Date: 11/12/2022    ECHOCARDIOGRAM REPORT   Patient Name:   MARJORIA MANCILLAS Date of Exam: 11/12/2022 Medical Rec #:  [id number]  Height:       64.0 in Accession #:    [id number]   Weight:       170.0 lb Date of Birth:  1941/07/10    BSA:          1.826 m Patient Age:    11 years     BP:           122/61 mmHg Patient Gender: F            HR:           63 bpm. Exam Location:  Inpatient Procedure: 2D Echo, Color Doppler, Cardiac Doppler and Intracardiac            Opacification Agent Indications:    Atrial Fibrillation I48.91  History:        Patient has prior history of Echocardiogram examinations, most                 recent 06/06/2022. Arrythmias:Atrial Fibrillation,                 Signs/Symptoms:Syncope; Risk Factors:Dyslipidemia and                 Hypertension.  Sonographer:    Greer Pickerel Referring Phys: Neva Seat, B  Sonographer Comments: Image acquisition challenging due to respiratory motion and Image acquisition challenging due to patient  body habitus. IMPRESSIONS  1. Left ventricular ejection fraction, by estimation, is 60 to 65%. The left ventricle has normal function. The left ventricle has no regional wall motion abnormalities. There is moderate concentric left ventricular hypertrophy. Left ventricular diastolic parameters are consistent with Grade I diastolic dysfunction (impaired relaxation).  2. Right ventricular systolic function is normal. The right ventricular size is normal.  3. The mitral valve is normal in structure. No evidence of mitral valve regurgitation. No evidence of mitral stenosis.  4. The aortic valve is tricuspid. Aortic valve regurgitation is not visualized. No aortic stenosis is present.  5. There is borderline dilatation of the aortic root, measuring 38 mm.  6. The inferior vena cava is normal in size with greater than 50% respiratory variability, suggesting right atrial pressure of 3 mmHg. FINDINGS  Left Ventricle: Left ventricular ejection fraction, by estimation, is 60 to 65%. The left ventricle has normal function. The left ventricle has no regional wall motion abnormalities. Definity contrast agent was given IV to delineate the left ventricular  endocardial borders. The left ventricular internal cavity size was normal in size. There is moderate concentric left ventricular hypertrophy. Left ventricular diastolic parameters are consistent with Grade I diastolic dysfunction (impaired relaxation). Normal left ventricular filling pressure. Right Ventricle: The right ventricular size is normal. No increase in right ventricular wall thickness. Right ventricular systolic function is normal. Left Atrium: Left atrial size was normal in size. Right Atrium: Right atrial size was normal in size. Pericardium: There is no evidence of pericardial effusion. Mitral Valve: The mitral valve is normal in structure. No evidence of mitral valve regurgitation. No evidence of mitral valve stenosis. Tricuspid Valve: The tricuspid valve is  normal in structure. Tricuspid valve regurgitation is not demonstrated. No evidence of tricuspid stenosis. Aortic Valve: The aortic valve is tricuspid. Aortic valve regurgitation is not visualized. No aortic stenosis is present. Pulmonic Valve: The pulmonic valve was normal in structure. Pulmonic valve regurgitation is not visualized. No evidence of pulmonic stenosis. Aorta: The aortic root and ascending aorta are structurally normal, with no evidence of dilitation. There is borderline dilatation of the aortic root,  measuring 38 mm. Venous: The inferior vena cava is normal in size with greater than 50% respiratory variability, suggesting right atrial pressure of 3 mmHg. IAS/Shunts: The interatrial septum was not well visualized.  LEFT VENTRICLE PLAX 2D LVIDd:         3.50 cm   Diastology LVIDs:         2.20 cm   LV e' medial:    6.74 cm/s LV PW:         1.70 cm   LV E/e' medial:  8.5 LV IVS:        1.40 cm   LV e' lateral:   5.44 cm/s LVOT diam:     2.00 cm   LV E/e' lateral: 10.6 LV SV:         68 LV SV Index:   37 LVOT Area:     3.14 cm  RIGHT VENTRICLE RV S prime:     14.00 cm/s TAPSE (M-mode): 2.1 cm LEFT ATRIUM           Index        RIGHT ATRIUM        Index LA diam:      2.20 cm 1.20 cm/m   RA Area:     19 cm LA Vol (A2C): 41.9 ml 22.95 ml/m  RA Volume:   49 ml LA Vol (A4C): 33.2 ml 18.18 ml/m  AORTIC VALVE LVOT Vmax:   92.00 cm/s LVOT Vmean:  63.700 cm/s LVOT VTI:    0.216 m  AORTA Ao Root diam: 3.80 cm Ao Asc diam:  3.60 cm MITRAL VALVE MV Area (PHT): 3.27 cm    SHUNTS MV Decel Time: 232 msec    Systemic VTI:  0.22 m MV E velocity: 57.60 cm/s  Systemic Diam: 2.00 cm MV A velocity: 92.00 cm/s MV E/A ratio:  0.63 Mihai Croitoru MD Electronically signed by Sanda Klein MD Signature Date/Time: 11/12/2022/2:43:47 PM    Final    DG HIP UNILAT WITH PELVIS 2-3 VIEWS LEFT  Result Date: 11/12/2022 CLINICAL DATA:  Left hip pain after fall. EXAM: DG HIP (WITH OR WITHOUT PELVIS) 2-3V LEFT COMPARISON:  Left  hip x-rays dated May 22, 2012, and May 20, 2012. FINDINGS: No acute fracture or dislocation. Prior bilateral total hip arthroplasties. No evidence of hardware failure or loosening. Progressive bilateral heterotopic ossification nearly bridging the acetabula and greater trochanters. Prior lower lumbar fusion. Soft tissues are unremarkable. IMPRESSION: 1. No acute osseous abnormality. 2. Prior bilateral total hip arthroplasties with progressive bilateral heterotopic ossification. Electronically Signed   By: Titus Dubin M.D.   On: 11/12/2022 10:01   DG Shoulder Right Port  Result Date: 11/11/2022 CLINICAL DATA:  Right shoulder dislocation. EXAM: RIGHT SHOULDER - 1 VIEW COMPARISON:  Same day. FINDINGS: There is been successful reduction of right glenohumeral joint dislocation noted on prior exam. No definite fracture is noted. Degenerative changes are seen involving the acromioclavicular and glenohumeral joints. IMPRESSION: Successful reduction of right glenohumeral joint dislocation. Electronically Signed   By: Marijo Conception M.D.   On: 11/11/2022 14:48   CT Head Wo Contrast  Result Date: 11/11/2022 CLINICAL DATA:  Trauma EXAM: CT HEAD WITHOUT CONTRAST CT CERVICAL SPINE WITHOUT CONTRAST TECHNIQUE: Multidetector CT imaging of the head and cervical spine was performed following the standard protocol without intravenous contrast. Multiplanar CT image reconstructions of the cervical spine were also generated. RADIATION DOSE REDUCTION: This exam was performed according to the departmental dose-optimization program which includes automated exposure control, adjustment  of the mA and/or kV according to patient size and/or use of iterative reconstruction technique. COMPARISON:  MRI Brain 06/07/22, CT head 06/06/22 FINDINGS: CT HEAD FINDINGS Brain: No evidence of acute infarction, hemorrhage, hydrocephalus, extra-axial collection or mass lesion/mass effect. Sequela of moderate chronic microvascular ischemic change.  Advanced generalized volume loss. Vascular: No hyperdense vessel or unexpected calcification. Skull: Normal. Negative for fracture or focal lesion. Sinuses/Orbits: Normal orbits. Air-fluid level in the left maxillary sinus. Other: None. CT CERVICAL SPINE FINDINGS Alignment: Straightening of the normal cervical lordosis. Skull base and vertebrae: Postsurgical changes from C5-C7 ACDF with solid osseous fusion. No evidence of perihardware lucency. Soft tissues and spinal canal: No prevertebral fluid or swelling. No visible canal hematoma. Disc levels:  No evidence of high-grade spinal canal stenosis. Upper chest: Negative. Other: None IMPRESSION: 1. No acute intracranial abnormality. 2. No acute fracture or traumatic malalignment of the cervical spine. Electronically Signed   By: Marin Roberts M.D.   On: 11/11/2022 12:45   CT Cervical Spine Wo Contrast  Result Date: 11/11/2022 CLINICAL DATA:  Trauma EXAM: CT HEAD WITHOUT CONTRAST CT CERVICAL SPINE WITHOUT CONTRAST TECHNIQUE: Multidetector CT imaging of the head and cervical spine was performed following the standard protocol without intravenous contrast. Multiplanar CT image reconstructions of the cervical spine were also generated. RADIATION DOSE REDUCTION: This exam was performed according to the departmental dose-optimization program which includes automated exposure control, adjustment of the mA and/or kV according to patient size and/or use of iterative reconstruction technique. COMPARISON:  MRI Brain 06/07/22, CT head 06/06/22 FINDINGS: CT HEAD FINDINGS Brain: No evidence of acute infarction, hemorrhage, hydrocephalus, extra-axial collection or mass lesion/mass effect. Sequela of moderate chronic microvascular ischemic change. Advanced generalized volume loss. Vascular: No hyperdense vessel or unexpected calcification. Skull: Normal. Negative for fracture or focal lesion. Sinuses/Orbits: Normal orbits. Air-fluid level in the left maxillary sinus. Other: None.  CT CERVICAL SPINE FINDINGS Alignment: Straightening of the normal cervical lordosis. Skull base and vertebrae: Postsurgical changes from C5-C7 ACDF with solid osseous fusion. No evidence of perihardware lucency. Soft tissues and spinal canal: No prevertebral fluid or swelling. No visible canal hematoma. Disc levels:  No evidence of high-grade spinal canal stenosis. Upper chest: Negative. Other: None IMPRESSION: 1. No acute intracranial abnormality. 2. No acute fracture or traumatic malalignment of the cervical spine. Electronically Signed   By: Marin Roberts M.D.   On: 11/11/2022 12:45   DG Wrist Complete Right  Result Date: 11/11/2022 CLINICAL DATA:  Fall, injury, pain, right shoulder dislocation EXAM: RIGHT WRIST - COMPLETE 3+ VIEW COMPARISON:  11/11/2022 FINDINGS: bones are osteopenic. Diffuse degenerative changes throughout the wrist joint with joint space loss, sclerosis and bony spurring of the distal radius, carpal bones, and the proximal metacarpals. No acute osseous finding or fracture. Normal alignment. No focal soft tissue abnormality. Peripheral atherosclerosis present. IMPRESSION: 1. Osteopenia and degenerative changes as above. 2. No acute osseous finding by plain radiography. Electronically Signed   By: Jerilynn Mages.  Shick M.D.   On: 11/11/2022 12:20   DG Shoulder Right  Result Date: 11/11/2022 CLINICAL DATA:  Fall, shoulder pain EXAM: RIGHT SHOULDER - 2+ VIEW COMPARISON:  11/11/2022 FINDINGS: Anterior inferior right shoulder dislocation. Bones are osteopenic. AC joint degenerative change noted without separation. No definite acute osseous finding or displaced fracture. Included right chest unremarkable. IMPRESSION: Anterior inferior right shoulder dislocation. Electronically Signed   By: Jerilynn Mages.  Shick M.D.   On: 11/11/2022 12:19   DG Elbow 2 Views Right  Result Date: 11/11/2022 CLINICAL  DATA:  fall, right shoulder dislocation EXAM: RIGHT ELBOW - 2 VIEW COMPARISON:  11/11/2022 FINDINGS: Limited  two-view exam. No displaced fracture or malalignment. No large effusion. Extremity soft tissue swelling suspected. IMPRESSION: Limited two-view exam. No acute finding by plain radiography. Electronically Signed   By: Jerilynn Mages.  Shick M.D.   On: 11/11/2022 12:18   DG Chest 1 View  Result Date: 11/11/2022 CLINICAL DATA:  Fall, right shoulder pain EXAM: CHEST  1 VIEW COMPARISON:  06/05/2022 FINDINGS: Anterior inferior right shoulder dislocation again noted. Degenerative changes throughout the spine and both shoulders. Similar elevation the right hemidiaphragm. Heart is enlarged without CHF or pneumonia. No effusion or pneumothorax. Aorta atherosclerotic. Lower cervical fusion hardware noted. IMPRESSION: Right shoulder dislocation. Stable chest exam. Electronically Signed   By: Jerilynn Mages.  Shick M.D.   On: 11/11/2022 12:16   DG Humerus Right  Result Date: 11/11/2022 CLINICAL DATA:  Right shoulder pain, fall EXAM: RIGHT HUMERUS - 2+ VIEW COMPARISON:  11/11/2022 FINDINGS: Anterior inferior dislocation of the right shoulder joint. AC joint degenerative change noted. Intact right humerus. No acute osseous finding or fracture. IMPRESSION: Right shoulder dislocation. Electronically Signed   By: Jerilynn Mages.  Shick M.D.   On: 11/11/2022 12:15     Scheduled Meds:  allopurinol  100 mg Oral Daily   apixaban  5 mg Oral BID   levothyroxine  50 mcg Oral Daily   metoprolol tartrate  25 mg Oral BID   sodium chloride flush  3 mL Intravenous Q12H   Continuous Infusions:   LOS: 0 days    Time spent: 51mn    PDomenic Polite MD [facility]   11/13/2022, 11:33 AM

## 2022-11-13 NOTE — Procedures (Signed)
Patient Name: LOISANN ROACH  MRN: [id number]  Epilepsy Attending: Lora Havens  Referring Physician/Provider: Domenic Polite, MD  Date: 11/13/2022 Duration: 23.20 mins  Patient history: 81 year old female with syncope.  EEG to evaluate for seizure.  Level of alertness: awake  AEDs during EEG study: None  Technical aspects: This EEG study was done with scalp electrodes positioned according to the 10-20 International system of electrode placement. Electrical activity was reviewed with band pass filter of 1-'70Hz'$ , sensitivity of 7 uV/mm, display speed of 42m/sec with a '60Hz'$  notched filter applied as appropriate. EEG data were recorded continuously and digitally stored.  Video monitoring was available and reviewed as appropriate.  Description: EEG showed continuous generalized predominantly 2 to 3 Hz delta slowing admixed with 5 to 7 Hz theta slowing.  Hyperventilation and photic stimulation were not performed.     ABNORMALITY - Continuous slow, generalized  IMPRESSION: This study is suggestive of moderate to severe diffuse encephalopathy, nonspecific etiology. No seizures or epileptiform discharges were seen throughout the recording.  Arline Ketter OBarbra Sarks

## 2022-11-13 NOTE — TOC Initial Note (Addendum)
Transition of Care [facility]) - Initial/Assessment Note    Patient Details  Name: [name] MRN: [id number] Date of Birth: 1941/12/16  [facility]) CM/SW Contact:    Bethann Berkshire, [place] Phone Number: 11/13/2022, 12:22 PM  Clinical Narrative:                  Pt ox1; CSW called pt's niece to discuss SNF rec. Niece lives in [place] and pt lives in [place]. Pt was at [facility] in June 20[facility]; she has not had any hospital admissions since then until now. Niece is agreeable to SNF w/u; she has preference for [facility]. Fl2 completed and bed requests faxed in hub.   Niece has questions about eventual ALF placement; CSW provides info for [facility] for niece to follow up with.   Expected Discharge Plan: Skilled Nursing Facility Barriers to Discharge: Insurance Authorization, No SNF bed   Patient Goals and CMS Choice        Expected Discharge Plan and Services Expected Discharge Plan: [place] arrangements for the past 2 months: Apartment                                      Prior Living Arrangements/Services Living arrangements for the past 2 months: Apartment Lives with:: Self Patient language and need for interpreter reviewed:: Yes        Need for Family Participation in Patient Care: Yes (Comment) Care giver support system in place?: No (comment)   Criminal Activity/Legal Involvement Pertinent to Current Situation/Hospitalization: No - Comment as needed  Activities of Daily Living      Permission Sought/Granted                  Emotional Assessment       Orientation: : Oriented to Self      Admission diagnosis:  New onset a-fib ([place]) [I48.91] Atrial fibrillation with RVR ([place]) [I48.91] Fall, initial encounter [W19.XXXA] Patient Active Problem List   Diagnosis Date Noted   Atrial fibrillation with RVR ([place]) 11/11/2022   Fall at home, initial encounter 11/11/2022   B12  deficiency 06/07/2022   Hypothyroidism 06/06/2022   Chronic kidney disease, stage 3b ([place]) 06/06/2022   Low back pain 06/06/2022   QT prolongation 06/06/2022   Hypokalemia 06/06/2022   Syncope and collapse 06/06/2022   Syncope 06/05/2022   Elevated uric acid in blood 11/13/2012   Right thyroid nodule 11/13/2012   Exocrine pancreatic insufficiency 01/07/2012   Hyperlipidemia 08/17/2011   Hypertension 08/17/2011   Osteoarthritis 08/17/2011   Pancreatic adenoma 08/17/2011   PCP:  Merrilee Seashore, MD Pharmacy:   [id number] [place], [address]Phone: [phone]: [phone]    Social Determinants of Health (SDOH) Interventions    Readmission Risk Interventions     No data to display

## 2022-11-13 NOTE — Progress Notes (Signed)
EEG complete - results pending 

## 2022-11-13 NOTE — TOC Progression Note (Signed)
Transition of Care [facility]) - Progression Note    Patient Details  Name: [name] MRN: [id number] Date of Birth: 07/31/1941  Transition of Care [facility]) CM/SW [place], [place] Phone Number: 11/13/2022, 3:03 PM  Clinical Narrative:     [name] can accept pt tomorrow. CSW submitted SNF auth request. Auth status: pending  Expected Discharge Plan: [place] Barriers to Discharge: Insurance Authorization, No SNF bed  Expected Discharge Plan and Services Expected Discharge Plan: [place] arrangements for the past 2 months: Apartment                                       Social Determinants of Health (SDOH) Interventions    Readmission Risk Interventions     No data to display

## 2022-11-14 DIAGNOSIS — I4891 Unspecified atrial fibrillation: Secondary | ICD-10-CM | POA: Diagnosis not present

## 2022-11-14 LAB — CBC
HCT: 37.9 % (ref 36.0–46.0)
Hemoglobin: 12.9 g/dL (ref 12.0–15.0)
MCH: 29.1 pg (ref 26.0–34.0)
MCHC: 34 g/dL (ref 30.0–36.0)
MCV: 85.6 fL (ref 80.0–100.0)
Platelets: 165 10*3/uL (ref 150–400)
RBC: 4.43 MIL/uL (ref 3.87–5.11)
RDW: 13 % (ref 11.5–15.5)
WBC: 10.2 10*3/uL (ref 4.0–10.5)
nRBC: 0 % (ref 0.0–0.2)

## 2022-11-14 LAB — BASIC METABOLIC PANEL
Anion gap: 8 (ref 5–15)
BUN: 38 mg/dL — ABNORMAL HIGH (ref 8–23)
CO2: 23 mmol/L (ref 22–32)
Calcium: 9.2 mg/dL (ref 8.9–10.3)
Chloride: 107 mmol/L (ref 98–111)
Creatinine, Ser: 1.39 mg/dL — ABNORMAL HIGH (ref 0.44–1.00)
GFR, Estimated: 38 mL/min — ABNORMAL LOW (ref >60)
Glucose, Bld: 103 mg/dL — ABNORMAL HIGH (ref 70–99)
Potassium: 3.5 mmol/L (ref 3.5–5.1)
Sodium: 138 mmol/L (ref 135–145)

## 2022-11-14 MED ORDER — METOPROLOL TARTRATE 25 MG PO TABS: 25.0000 mg | ORAL_TABLET | Freq: Two times a day (BID) | ORAL | Status: AC

## 2022-11-14 MED ORDER — APIXABAN 5 MG PO TABS: 5.0000 mg | ORAL_TABLET | Freq: Two times a day (BID) | ORAL | Status: AC

## 2022-11-14 MED ORDER — TRAZODONE HCL 50 MG PO TABS
25.0000 mg | ORAL_TABLET | Freq: Every evening | ORAL | Status: DC | PRN
  Administered 2022-11-14: 25 mg via ORAL
  Filled 2022-11-14: qty 1

## 2022-11-14 MED ORDER — LORAZEPAM 2 MG/ML IJ SOLN
0.5000 mg | Freq: Once | INTRAMUSCULAR | Status: AC
  Administered 2022-11-14: 0.5 mg via INTRAVENOUS
  Filled 2022-11-14: qty 1

## 2022-11-14 NOTE — Discharge Summary (Addendum)
Physician Discharge Summary  [name] WEX:[id number] DOB: August 02, 1941 DOA: 11/11/2022  PCP: Merrilee Seashore, MD  Admit date: 11/11/2022 Discharge date: 11/15/2022  Admitted From: home Disposition:  SNF when bed available  Recommendations for Outpatient Follow-up:  Follow up with PCP in 1-2 weeks Please obtain BMP/CBC in one week  Home Health: none Equipment/Devices: none  Discharge Condition: stable CODE STATUS: Full code Diet Orders (From admission, onward)     Start     Ordered   11/11/22 1518  Diet Heart Room service appropriate? Yes; Fluid consistency: Thin  Diet effective now       Question Answer Comment  Room service appropriate? Yes   Fluid consistency: Thin      11/11/22 1518            HPI: Per admitting MD, [name] is a 81 y.o. female with medical history significant of CKD 3B, hyperlipidemia, hypertension, hypothyroidism, low back pain, gout, pancreatic adenoma pancreatic insufficiency presenting after being found down at home. Patient was reportedly last seen normal last Wednesday by neighbors.  EMS was called today for a wellness check.  Patient was found down on her right side.  EMS checked the home and noted that her pillbox still contain all the pills from last Thursday for on. Patient states that she remembers a fall but does not remember details.  She complains of right shoulder pain. She denies fevers, chills, chest pain, shortness of breath, abdominal pain, constipation, diarrhea, nausea, vomiting.  Hospital Course / Discharge diagnoses: Principal Problem:   Atrial fibrillation with RVR (HCC) Active Problems:   Hyperlipidemia   Hypertension   Exocrine pancreatic insufficiency   Hypothyroidism   Chronic kidney disease, stage 3b (HCC)   Fall at home, initial encounter  Principal problem New onset PAF with RVR-Converted to NSR with dilt gtt. Continue beta blocker, now she was started on metoprolol. 2D echo with preserved EF, no wall  motion abnormality, grade 1 diastolic dysfunction, RV function is preserved. Started eliquis. CHA2DS2-VASc score is 4 (HTN, age, sex). PT OT eval completed, SNF recommended for short-term rehab  Active problems Fall at home-Note admission in June 2023 for syncope, orthostatic is a possibility considering soft BPs, hemoconcentration etc. she is unable to stand up to check orthostatics at this time, has been appropriately hydrated.  EEG did not show any seizures. PT recommending SNF  Right shoulder dislocation-s/p reduction in the ED with adequate reduction confirmed by XR. Wrist, elbow XR nonacute. Continue sling. Leukocytosis-Due to hemoconcentration, resolved with hydration Stage IIIb CKD -baseline creatinine 1.3-1.6, currently at baseline Lactic acidosis - improved Degenerative joint disease-Widespread with multiple orthopedic surgical interventions. PT Hypothyroidism- TSH 4.135. Continue home synthroid  Essential hypertension -Blood pressure soft initially, suspect component of dehydration, blood pressure improved after fluids.  Resume home medications on discharge Gout - Continue home allopurinol (started last hospitalization) Depression/anxiety-Resume Wellbutrin and sertraline  Sepsis ruled out   Discharge Instructions  Discharge Instructions     Amb referral to [facility]   Complete by: As directed       Allergies as of 11/15/2022       Reactions   Codeine    CAN TAKE SYNTHETIC        Medication List     STOP taking these medications    carvedilol 12.5 MG tablet Commonly known as: COREG       TAKE these medications    allopurinol 100 MG tablet Commonly known as: ZYLOPRIM Take 1 tablet (100  mg total) by mouth daily.   amLODipine 5 MG tablet Commonly known as: NORVASC Take 1 tablet (5 mg total) by mouth daily.   apixaban 5 MG Tabs tablet Commonly known as: ELIQUIS Take 1 tablet (5 mg total) by mouth 2 (two) times daily.   buPROPion 150 MG 24 hr  tablet Commonly known as: WELLBUTRIN XL Take 150 mg by mouth every morning.   levothyroxine 50 MCG tablet Commonly known as: SYNTHROID TAKE 1 TABLET BY MOUTH DAILY ON EMPTY STOMACH WITHOUT FOOD OR OTHER MEDS *FOLLOW UP IN 6 WEEKS*   metoprolol tartrate 25 MG tablet Commonly known as: LOPRESSOR Take 1 tablet (25 mg total) by mouth 2 (two) times daily.   sertraline 50 MG tablet Commonly known as: ZOLOFT Take 1 tablet (50 mg total) by mouth daily.         Consultations: none  Procedures/Studies:  EEG adult  Result Date: 11/13/2022 Lora Havens, MD     11/13/2022 11:08 AM Patient Name: [name] MRN: [id number] Epilepsy Attending: Lora Havens Referring Physician/Provider: Domenic Polite, MD Date: 11/13/2022 Duration: 23.20 mins Patient history: 81 year old female with syncope.  EEG to evaluate for seizure. Level of alertness: awake AEDs during EEG study: None Technical aspects: This EEG study was done with scalp electrodes positioned according to the 10-20 International system of electrode placement. Electrical activity was reviewed with band pass filter of 1-'70Hz'$ , sensitivity of 7 uV/mm, display speed of 21m/sec with a '60Hz'$  notched filter applied as appropriate. EEG data were recorded continuously and digitally stored.  Video monitoring was available and reviewed as appropriate. Description: EEG showed continuous generalized predominantly 2 to 3 Hz delta slowing admixed with 5 to 7 Hz theta slowing.  Hyperventilation and photic stimulation were not performed.   ABNORMALITY - Continuous slow, generalized IMPRESSION: This study is suggestive of moderate to severe diffuse encephalopathy, nonspecific etiology. No seizures or epileptiform discharges were seen throughout the recording. PLora Havens  ECHOCARDIOGRAM COMPLETE  Result Date: 11/12/2022    ECHOCARDIOGRAM REPORT   Patient Name:   Sierra PEDate of Exam: 11/12/2022 Medical Rec #:  [id number]   Height:        64.0 in Accession #:    [id number]  Weight:       170.0 lb Date of Birth:  810-03-1941   BSA:          1.826 m Patient Age:    89years     BP:           122/61 mmHg Patient Gender: F            HR:           63 bpm. Exam Location:  Inpatient Procedure: 2D Echo, Color Doppler, Cardiac Doppler and Intracardiac            Opacification Agent Indications:    Atrial Fibrillation I48.91  History:        Patient has prior history of Echocardiogram examinations, most                 recent 06/06/2022. Arrythmias:Atrial Fibrillation,                 Signs/Symptoms:Syncope; Risk Factors:Dyslipidemia and                 Hypertension.  Sonographer:    NGreer PickerelReferring Phys: MNeva Seat B  Sonographer Comments: Image acquisition challenging due to respiratory motion and Image acquisition  challenging due to patient body habitus. IMPRESSIONS  1. Left ventricular ejection fraction, by estimation, is 60 to 65%. The left ventricle has normal function. The left ventricle has no regional wall motion abnormalities. There is moderate concentric left ventricular hypertrophy. Left ventricular diastolic parameters are consistent with Grade I diastolic dysfunction (impaired relaxation).  2. Right ventricular systolic function is normal. The right ventricular size is normal.  3. The mitral valve is normal in structure. No evidence of mitral valve regurgitation. No evidence of mitral stenosis.  4. The aortic valve is tricuspid. Aortic valve regurgitation is not visualized. No aortic stenosis is present.  5. There is borderline dilatation of the aortic root, measuring 38 mm.  6. The inferior vena cava is normal in size with greater than 50% respiratory variability, suggesting right atrial pressure of 3 mmHg. FINDINGS  Left Ventricle: Left ventricular ejection fraction, by estimation, is 60 to 65%. The left ventricle has normal function. The left ventricle has no regional wall motion abnormalities. Definity contrast agent was given  IV to delineate the left ventricular  endocardial borders. The left ventricular internal cavity size was normal in size. There is moderate concentric left ventricular hypertrophy. Left ventricular diastolic parameters are consistent with Grade I diastolic dysfunction (impaired relaxation). Normal left ventricular filling pressure. Right Ventricle: The right ventricular size is normal. No increase in right ventricular wall thickness. Right ventricular systolic function is normal. Left Atrium: Left atrial size was normal in size. Right Atrium: Right atrial size was normal in size. Pericardium: There is no evidence of pericardial effusion. Mitral Valve: The mitral valve is normal in structure. No evidence of mitral valve regurgitation. No evidence of mitral valve stenosis. Tricuspid Valve: The tricuspid valve is normal in structure. Tricuspid valve regurgitation is not demonstrated. No evidence of tricuspid stenosis. Aortic Valve: The aortic valve is tricuspid. Aortic valve regurgitation is not visualized. No aortic stenosis is present. Pulmonic Valve: The pulmonic valve was normal in structure. Pulmonic valve regurgitation is not visualized. No evidence of pulmonic stenosis. Aorta: The aortic root and ascending aorta are structurally normal, with no evidence of dilitation. There is borderline dilatation of the aortic root, measuring 38 mm. Venous: The inferior vena cava is normal in size with greater than 50% respiratory variability, suggesting right atrial pressure of 3 mmHg. IAS/Shunts: The interatrial septum was not well visualized.  LEFT VENTRICLE PLAX 2D LVIDd:         3.50 cm   Diastology LVIDs:         2.20 cm   LV e' medial:    6.74 cm/s LV PW:         1.70 cm   LV E/e' medial:  8.5 LV IVS:        1.40 cm   LV e' lateral:   5.44 cm/s LVOT diam:     2.00 cm   LV E/e' lateral: 10.6 LV SV:         68 LV SV Index:   37 LVOT Area:     3.14 cm  RIGHT VENTRICLE RV S prime:     14.00 cm/s TAPSE (M-mode): 2.1 cm LEFT  ATRIUM           Index        RIGHT ATRIUM        Index LA diam:      2.20 cm 1.20 cm/m   RA Area:     19 cm LA Vol (A2C): 41.9 ml 22.95 ml/m  RA Volume:  49 ml LA Vol (A4C): 33.2 ml 18.18 ml/m  AORTIC VALVE LVOT Vmax:   92.00 cm/s LVOT Vmean:  63.700 cm/s LVOT VTI:    0.216 m  AORTA Ao Root diam: 3.80 cm Ao Asc diam:  3.60 cm MITRAL VALVE MV Area (PHT): 3.27 cm    SHUNTS MV Decel Time: 232 msec    Systemic VTI:  0.22 m MV E velocity: 57.60 cm/s  Systemic Diam: 2.00 cm MV A velocity: 92.00 cm/s MV E/A ratio:  0.63 Mihai Croitoru MD Electronically signed by Sanda Klein MD Signature Date/Time: 11/12/2022/2:43:47 PM    Final    DG HIP UNILAT WITH PELVIS 2-3 VIEWS LEFT  Result Date: 11/12/2022 CLINICAL DATA:  Left hip pain after fall. EXAM: DG HIP (WITH OR WITHOUT PELVIS) 2-3V LEFT COMPARISON:  Left hip x-rays dated May 22, 2012, and May 20, 2012. FINDINGS: No acute fracture or dislocation. Prior bilateral total hip arthroplasties. No evidence of hardware failure or loosening. Progressive bilateral heterotopic ossification nearly bridging the acetabula and greater trochanters. Prior lower lumbar fusion. Soft tissues are unremarkable. IMPRESSION: 1. No acute osseous abnormality. 2. Prior bilateral total hip arthroplasties with progressive bilateral heterotopic ossification. Electronically Signed   By: Titus Dubin M.D.   On: 11/12/2022 10:01   DG Shoulder Right Port  Result Date: 11/11/2022 CLINICAL DATA:  Right shoulder dislocation. EXAM: RIGHT SHOULDER - 1 VIEW COMPARISON:  Same day. FINDINGS: There is been successful reduction of right glenohumeral joint dislocation noted on prior exam. No definite fracture is noted. Degenerative changes are seen involving the acromioclavicular and glenohumeral joints. IMPRESSION: Successful reduction of right glenohumeral joint dislocation. Electronically Signed   By: Marijo Conception M.D.   On: 11/11/2022 14:48   CT Head Wo Contrast  Result Date:  11/11/2022 CLINICAL DATA:  Trauma EXAM: CT HEAD WITHOUT CONTRAST CT CERVICAL SPINE WITHOUT CONTRAST TECHNIQUE: Multidetector CT imaging of the head and cervical spine was performed following the standard protocol without intravenous contrast. Multiplanar CT image reconstructions of the cervical spine were also generated. RADIATION DOSE REDUCTION: This exam was performed according to the departmental dose-optimization program which includes automated exposure control, adjustment of the mA and/or kV according to patient size and/or use of iterative reconstruction technique. COMPARISON:  MRI Brain 06/07/22, CT head 06/06/22 FINDINGS: CT HEAD FINDINGS Brain: No evidence of acute infarction, hemorrhage, hydrocephalus, extra-axial collection or mass lesion/mass effect. Sequela of moderate chronic microvascular ischemic change. Advanced generalized volume loss. Vascular: No hyperdense vessel or unexpected calcification. Skull: Normal. Negative for fracture or focal lesion. Sinuses/Orbits: Normal orbits. Air-fluid level in the left maxillary sinus. Other: None. CT CERVICAL SPINE FINDINGS Alignment: Straightening of the normal cervical lordosis. Skull base and vertebrae: Postsurgical changes from C5-C7 ACDF with solid osseous fusion. No evidence of perihardware lucency. Soft tissues and spinal canal: No prevertebral fluid or swelling. No visible canal hematoma. Disc levels:  No evidence of high-grade spinal canal stenosis. Upper chest: Negative. Other: None IMPRESSION: 1. No acute intracranial abnormality. 2. No acute fracture or traumatic malalignment of the cervical spine. Electronically Signed   By: Marin Roberts M.D.   On: 11/11/2022 12:45   CT Cervical Spine Wo Contrast  Result Date: 11/11/2022 CLINICAL DATA:  Trauma EXAM: CT HEAD WITHOUT CONTRAST CT CERVICAL SPINE WITHOUT CONTRAST TECHNIQUE: Multidetector CT imaging of the head and cervical spine was performed following the standard protocol without intravenous  contrast. Multiplanar CT image reconstructions of the cervical spine were also generated. RADIATION DOSE REDUCTION: This exam was  performed according to the departmental dose-optimization program which includes automated exposure control, adjustment of the mA and/or kV according to patient size and/or use of iterative reconstruction technique. COMPARISON:  MRI Brain 06/07/22, CT head 06/06/22 FINDINGS: CT HEAD FINDINGS Brain: No evidence of acute infarction, hemorrhage, hydrocephalus, extra-axial collection or mass lesion/mass effect. Sequela of moderate chronic microvascular ischemic change. Advanced generalized volume loss. Vascular: No hyperdense vessel or unexpected calcification. Skull: Normal. Negative for fracture or focal lesion. Sinuses/Orbits: Normal orbits. Air-fluid level in the left maxillary sinus. Other: None. CT CERVICAL SPINE FINDINGS Alignment: Straightening of the normal cervical lordosis. Skull base and vertebrae: Postsurgical changes from C5-C7 ACDF with solid osseous fusion. No evidence of perihardware lucency. Soft tissues and spinal canal: No prevertebral fluid or swelling. No visible canal hematoma. Disc levels:  No evidence of high-grade spinal canal stenosis. Upper chest: Negative. Other: None IMPRESSION: 1. No acute intracranial abnormality. 2. No acute fracture or traumatic malalignment of the cervical spine. Electronically Signed   By: Marin Roberts M.D.   On: 11/11/2022 12:45   DG Wrist Complete Right  Result Date: 11/11/2022 CLINICAL DATA:  Fall, injury, pain, right shoulder dislocation EXAM: RIGHT WRIST - COMPLETE 3+ VIEW COMPARISON:  11/11/2022 FINDINGS: bones are osteopenic. Diffuse degenerative changes throughout the wrist joint with joint space loss, sclerosis and bony spurring of the distal radius, carpal bones, and the proximal metacarpals. No acute osseous finding or fracture. Normal alignment. No focal soft tissue abnormality. Peripheral atherosclerosis present.  IMPRESSION: 1. Osteopenia and degenerative changes as above. 2. No acute osseous finding by plain radiography. Electronically Signed   By: Jerilynn Mages.  Shick M.D.   On: 11/11/2022 12:20   DG Shoulder Right  Result Date: 11/11/2022 CLINICAL DATA:  Fall, shoulder pain EXAM: RIGHT SHOULDER - 2+ VIEW COMPARISON:  11/11/2022 FINDINGS: Anterior inferior right shoulder dislocation. Bones are osteopenic. AC joint degenerative change noted without separation. No definite acute osseous finding or displaced fracture. Included right chest unremarkable. IMPRESSION: Anterior inferior right shoulder dislocation. Electronically Signed   By: Jerilynn Mages.  Shick M.D.   On: 11/11/2022 12:19   DG Elbow 2 Views Right  Result Date: 11/11/2022 CLINICAL DATA:  fall, right shoulder dislocation EXAM: RIGHT ELBOW - 2 VIEW COMPARISON:  11/11/2022 FINDINGS: Limited two-view exam. No displaced fracture or malalignment. No large effusion. Extremity soft tissue swelling suspected. IMPRESSION: Limited two-view exam. No acute finding by plain radiography. Electronically Signed   By: Jerilynn Mages.  Shick M.D.   On: 11/11/2022 12:18   DG Chest 1 View  Result Date: 11/11/2022 CLINICAL DATA:  Fall, right shoulder pain EXAM: CHEST  1 VIEW COMPARISON:  06/05/2022 FINDINGS: Anterior inferior right shoulder dislocation again noted. Degenerative changes throughout the spine and both shoulders. Similar elevation the right hemidiaphragm. Heart is enlarged without CHF or pneumonia. No effusion or pneumothorax. Aorta atherosclerotic. Lower cervical fusion hardware noted. IMPRESSION: Right shoulder dislocation. Stable chest exam. Electronically Signed   By: Jerilynn Mages.  Shick M.D.   On: 11/11/2022 12:16   DG Humerus Right  Result Date: 11/11/2022 CLINICAL DATA:  Right shoulder pain, fall EXAM: RIGHT HUMERUS - 2+ VIEW COMPARISON:  11/11/2022 FINDINGS: Anterior inferior dislocation of the right shoulder joint. AC joint degenerative change noted. Intact right humerus. No acute osseous  finding or fracture. IMPRESSION: Right shoulder dislocation. Electronically Signed   By: Jerilynn Mages.  Shick M.D.   On: 11/11/2022 12:15     Subjective: - no chest pain, shortness of breath, no abdominal pain, nausea or vomiting.   Discharge Exam:  BP (!) 151/69 (BP Location: Right Wrist)   Pulse 60   Temp 97.8 F (36.6 C) (Oral)   Resp 19   Ht '5\' 4"'$  (1.626 m)   Wt 77.1 kg   SpO2 90%   BMI 29.18 kg/m   General: Pt is alert, awake, not in acute distress Cardiovascular: RRR, S1/S2 +, no rubs, no gallops Respiratory: CTA bilaterally, no wheezing, no rhonchi Abdominal: Soft, NT, ND, bowel sounds + Extremities: no edema, no cyanosis   The results of significant diagnostics from this hospitalization (including imaging, microbiology, ancillary and laboratory) are listed below for reference.     Microbiology: Recent Results (from the past 240 hour(s))  MRSA Next Gen by PCR, Nasal     Status: None   Collection Time: 11/12/22 10:21 PM   Specimen: Nasal Mucosa; Nasal Swab  Result Value Ref Range Status   MRSA by PCR Next Gen NOT DETECTED NOT DETECTED Final    Comment: (NOTE) The GeneXpert MRSA Assay (FDA approved for NASAL specimens only), is one component of a comprehensive MRSA colonization surveillance program. It is not intended to diagnose MRSA infection nor to guide or monitor treatment for MRSA infections. Test performance is not FDA approved in patients less than 57 years old. Performed at [facility], [place] [address], [place], [place] [address]      Labs: Basic Metabolic Panel: Recent Labs  Lab 11/11/22 1029 11/12/22 0330 11/13/22 0021 11/14/22 0018  NA 142 139 140 138  K 4.3 3.6 3.8 3.5  CL 105 108 110 107  CO2 19* 20* 21* 23  GLUCOSE 157* 124* 77 103*  BUN 46* 57* 46* 38*  CREATININE 1.42* 1.64* 1.19* 1.39*  CALCIUM 10.9* 9.2 8.6* 9.2   Liver Function Tests: Recent Labs  Lab 11/11/22 1029 11/12/22 0330  AST 45* 27  ALT 39 31  ALKPHOS 83 56  BILITOT  0.8 0.8  PROT 7.1 5.3*  ALBUMIN 3.3* 2.6*   CBC: Recent Labs  Lab 11/11/22 1029 11/12/22 0330 11/13/22 0021 11/14/22 0018  WBC 20.9* 14.5* 9.9 10.2  NEUTROABS 16.9*  --  6.9  --   HGB 16.9* 14.0 11.4* 12.9  HCT 50.1* 43.0 33.7* 37.9  MCV 85.2 87.9 86.6 85.6  PLT 230 169 151 165   CBG: No results for input(s): "GLUCAP" in the last 168 hours. Hgb A1c No results for input(s): "HGBA1C" in the last 72 hours. Lipid Profile No results for input(s): "CHOL", "HDL", "LDLCALC", "TRIG", "CHOLHDL", "LDLDIRECT" in the last 72 hours. Thyroid function studies No results for input(s): "TSH", "T4TOTAL", "T3FREE", "THYROIDAB" in the last 72 hours.  Invalid input(s): "FREET3" Urinalysis    Component Value Date/Time   COLORURINE YELLOW 11/11/2022 [place] 11/11/2022 1015   LABSPEC 1.010 11/11/2022 1015   PHURINE 7.5 11/11/2022 1015   GLUCOSEU NEGATIVE 11/11/2022 1015   HGBUR NEGATIVE 11/11/2022 1015   BILIRUBINUR NEGATIVE 11/11/2022 1015   BILIRUBINUR NEG 12/25/2020 1428   [place] 11/11/2022 1015   PROTEINUR NEGATIVE 11/11/2022 1015   UROBILINOGEN 0.2 12/25/2020 1428   UROBILINOGEN 1.0 05/15/2015 2049   NITRITE NEGATIVE 11/11/2022 1015   LEUKOCYTESUR NEGATIVE 11/11/2022 1015    FURTHER DISCHARGE INSTRUCTIONS:   Get Medicines reviewed and adjusted: Please take all your medications with you for your next visit with your Primary MD   Laboratory/radiological data: Please request your Primary MD to go over all hospital tests and procedure/radiological results at the follow up, please ask your Primary MD to get  all Hospital records sent to his/her office.   In some cases, they will be blood work, cultures and biopsy results pending at the time of your discharge. Please request that your primary care M.D. goes through all the records of your hospital data and follows up on these results.   Also Note the following: If you experience worsening of your admission  symptoms, develop shortness of breath, life threatening emergency, suicidal or homicidal thoughts you must seek medical attention immediately by calling 911 or calling your MD immediately  if symptoms less severe.   You must read complete instructions/literature along with all the possible adverse reactions/side effects for all the Medicines you take and that have been prescribed to you. Take any new Medicines after you have completely understood and accpet all the possible adverse reactions/side effects.    Do not drive when taking Pain medications or sleeping medications (Benzodaizepines)   Do not take more than prescribed Pain, Sleep and Anxiety Medications. It is not advisable to combine anxiety,sleep and pain medications without talking with your primary care practitioner   Special Instructions: If you have smoked or chewed Tobacco  in the last 2 yrs please stop smoking, stop any regular Alcohol  and or any Recreational drug use.   Wear Seat belts while driving.   Please note: You were cared for by a hospitalist during your hospital stay. Once you are discharged, your primary care physician will handle any further medical issues. Please note that NO REFILLS for any discharge medications will be authorized once you are discharged, as it is imperative that you return to your primary care physician (or establish a relationship with a primary care physician if you do not have one) for your post hospital discharge needs so that they can reassess your need for medications and monitor your lab values.  Time coordinating discharge: 40 minutes  SIGNED:  Marzetta Board, MD, PhD 11/15/2022, 8:50 AM

## 2022-11-14 NOTE — TOC Progression Note (Signed)
Transition of Care [facility]) - Progression Note    Patient Details  Name: [name] MRN: [id number] Date of Birth: 06/16/1941  Transition of Care [facility]) CM/SW [place], [place] Phone Number: 11/14/2022, 3:47 PM  Clinical Narrative:     CSW has been monitoring SNF auth in [facility] frequently throughout the day. Auth status still currently pending. TOC will continue to follow for auth approval and DC to [facility]; likely tomorrow.   Expected Discharge Plan: Skilled Nursing Facility Barriers to Discharge: Insurance Authorization, No SNF bed  Expected Discharge Plan and Services Expected Discharge Plan: [place] arrangements for the past 2 months: Apartment                                       Social Determinants of Health (SDOH) Interventions    Readmission Risk Interventions     No data to display

## 2022-11-14 NOTE — Plan of Care (Signed)
  Problem: Activity: Goal: Ability to tolerate increased activity will improve Outcome: Progressing   Problem: Cardiac: Goal: Ability to achieve and maintain adequate cardiopulmonary perfusion will improve Outcome: Progressing   Problem: Clinical Measurements: Goal: Ability to maintain clinical measurements within normal limits will improve Outcome: Progressing Goal: Will remain free from infection Outcome: Progressing Goal: Respiratory complications will improve Outcome: Progressing Goal: Cardiovascular complication will be avoided Outcome: Progressing

## 2022-11-14 NOTE — Progress Notes (Signed)
Mobility Specialist Progress Note    11/14/22 1123  Mobility  Activity Transferred from bed to chair  Level of Assistance +2 (takes two people)  Assistive Device Other (Comment) (HHA)  RUE Weight Bearing NWB  Activity Response Tolerated fair  Mobility Referral Yes  $Mobility charge 1 Mobility   Pre-Mobility: 56 HR Post-Mobility: 56 HR  Pt received requiring moderate cues for arousal.C/o back and leg pain. MaxA+1 for bed mobility. MaxA+2 for transfer. Pt unable to fully extend legs. During session, pt repeatedly asking where she was and how she ended up back in the hospital. Left in chair with call bell in reach and chair alarm on. NT present.   Hildred Alamin Mobility Specialist  Please Psychologist, sport and exercise or Rehab Office at [phone]

## 2022-11-14 NOTE — Discharge Instructions (Signed)

## 2022-11-14 NOTE — Progress Notes (Signed)
Mobility Specialist Progress Note    11/14/22 1502  Mobility  Activity Transferred from chair to bed  Level of Assistance +2 (takes two people)  Assistive Device Other (Comment) (HHA)  RUE Weight Bearing NWB  Activity Response Tolerated fair  Mobility Referral Yes  $Mobility charge 1 Mobility   Post-Mobility: 51 HR  Pt received and agreeable. Pt repeatedly asking if she could stay here and for how long. No complaints. Left with call bell in reach and bed alarm on.   Hildred Alamin Mobility Specialist  Please Contact via SecureChat or Rehab Office at [phone]

## 2022-11-15 DIAGNOSIS — R0981 Nasal congestion: Secondary | ICD-10-CM | POA: Diagnosis not present

## 2022-11-15 DIAGNOSIS — S43004A Unspecified dislocation of right shoulder joint, initial encounter: Secondary | ICD-10-CM | POA: Diagnosis not present

## 2022-11-15 DIAGNOSIS — E785 Hyperlipidemia, unspecified: Secondary | ICD-10-CM | POA: Diagnosis present

## 2022-11-15 DIAGNOSIS — B964 Proteus (mirabilis) (morganii) as the cause of diseases classified elsewhere: Secondary | ICD-10-CM | POA: Diagnosis present

## 2022-11-15 DIAGNOSIS — N390 Urinary tract infection, site not specified: Secondary | ICD-10-CM | POA: Diagnosis present

## 2022-11-15 DIAGNOSIS — J9601 Acute respiratory failure with hypoxia: Secondary | ICD-10-CM | POA: Diagnosis not present

## 2022-11-15 DIAGNOSIS — Z79899 Other long term (current) drug therapy: Secondary | ICD-10-CM | POA: Diagnosis not present

## 2022-11-15 DIAGNOSIS — M25511 Pain in right shoulder: Secondary | ICD-10-CM | POA: Diagnosis not present

## 2022-11-15 DIAGNOSIS — K5939 Other megacolon: Secondary | ICD-10-CM | POA: Diagnosis not present

## 2022-11-15 DIAGNOSIS — G9341 Metabolic encephalopathy: Secondary | ICD-10-CM | POA: Diagnosis not present

## 2022-11-15 DIAGNOSIS — F39 Unspecified mood [affective] disorder: Secondary | ICD-10-CM | POA: Diagnosis not present

## 2022-11-15 DIAGNOSIS — Z9181 History of falling: Secondary | ICD-10-CM | POA: Diagnosis not present

## 2022-11-15 DIAGNOSIS — S43003D Unspecified subluxation of unspecified shoulder joint, subsequent encounter: Secondary | ICD-10-CM | POA: Diagnosis not present

## 2022-11-15 DIAGNOSIS — K3189 Other diseases of stomach and duodenum: Secondary | ICD-10-CM | POA: Diagnosis not present

## 2022-11-15 DIAGNOSIS — R2681 Unsteadiness on feet: Secondary | ICD-10-CM | POA: Diagnosis not present

## 2022-11-15 DIAGNOSIS — A419 Sepsis, unspecified organism: Secondary | ICD-10-CM | POA: Diagnosis not present

## 2022-11-15 DIAGNOSIS — E039 Hypothyroidism, unspecified: Secondary | ICD-10-CM | POA: Diagnosis not present

## 2022-11-15 DIAGNOSIS — F05 Delirium due to known physiological condition: Secondary | ICD-10-CM | POA: Diagnosis not present

## 2022-11-15 DIAGNOSIS — I469 Cardiac arrest, cause unspecified: Secondary | ICD-10-CM | POA: Diagnosis not present

## 2022-11-15 DIAGNOSIS — Z4682 Encounter for fitting and adjustment of non-vascular catheter: Secondary | ICD-10-CM | POA: Diagnosis not present

## 2022-11-15 DIAGNOSIS — R41841 Cognitive communication deficit: Secondary | ICD-10-CM | POA: Diagnosis not present

## 2022-11-15 DIAGNOSIS — N183 Chronic kidney disease, stage 3 unspecified: Secondary | ICD-10-CM | POA: Diagnosis not present

## 2022-11-15 DIAGNOSIS — J1282 Pneumonia due to coronavirus disease 2019: Secondary | ICD-10-CM | POA: Diagnosis not present

## 2022-11-15 DIAGNOSIS — I341 Nonrheumatic mitral (valve) prolapse: Secondary | ICD-10-CM | POA: Diagnosis present

## 2022-11-15 DIAGNOSIS — R630 Anorexia: Secondary | ICD-10-CM | POA: Diagnosis not present

## 2022-11-15 DIAGNOSIS — Z8249 Family history of ischemic heart disease and other diseases of the circulatory system: Secondary | ICD-10-CM | POA: Diagnosis not present

## 2022-11-15 DIAGNOSIS — F03A11 Unspecified dementia, mild, with agitation: Secondary | ICD-10-CM | POA: Diagnosis not present

## 2022-11-15 DIAGNOSIS — I444 Left anterior fascicular block: Secondary | ICD-10-CM | POA: Diagnosis present

## 2022-11-15 DIAGNOSIS — R55 Syncope and collapse: Secondary | ICD-10-CM | POA: Diagnosis not present

## 2022-11-15 DIAGNOSIS — Z833 Family history of diabetes mellitus: Secondary | ICD-10-CM | POA: Diagnosis not present

## 2022-11-15 DIAGNOSIS — R1312 Dysphagia, oropharyngeal phase: Secondary | ICD-10-CM | POA: Diagnosis not present

## 2022-11-15 DIAGNOSIS — I4891 Unspecified atrial fibrillation: Secondary | ICD-10-CM | POA: Diagnosis not present

## 2022-11-15 DIAGNOSIS — I150 Renovascular hypertension: Secondary | ICD-10-CM | POA: Diagnosis not present

## 2022-11-15 DIAGNOSIS — D72829 Elevated white blood cell count, unspecified: Secondary | ICD-10-CM | POA: Diagnosis not present

## 2022-11-15 DIAGNOSIS — F03A0 Unspecified dementia, mild, without behavioral disturbance, psychotic disturbance, mood disturbance, and anxiety: Secondary | ICD-10-CM | POA: Diagnosis not present

## 2022-11-15 DIAGNOSIS — R531 Weakness: Secondary | ICD-10-CM | POA: Diagnosis not present

## 2022-11-15 DIAGNOSIS — N179 Acute kidney failure, unspecified: Secondary | ICD-10-CM | POA: Diagnosis not present

## 2022-11-15 DIAGNOSIS — U071 COVID-19: Secondary | ICD-10-CM | POA: Diagnosis not present

## 2022-11-15 DIAGNOSIS — A4102 Sepsis due to Methicillin resistant Staphylococcus aureus: Secondary | ICD-10-CM | POA: Diagnosis not present

## 2022-11-15 DIAGNOSIS — Z96643 Presence of artificial hip joint, bilateral: Secondary | ICD-10-CM | POA: Diagnosis not present

## 2022-11-15 DIAGNOSIS — F331 Major depressive disorder, recurrent, moderate: Secondary | ICD-10-CM | POA: Diagnosis not present

## 2022-11-15 DIAGNOSIS — B9689 Other specified bacterial agents as the cause of diseases classified elsewhere: Secondary | ICD-10-CM | POA: Diagnosis present

## 2022-11-15 DIAGNOSIS — Z6828 Body mass index (BMI) 28.0-28.9, adult: Secondary | ICD-10-CM | POA: Diagnosis not present

## 2022-11-15 DIAGNOSIS — E872 Acidosis, unspecified: Secondary | ICD-10-CM | POA: Diagnosis not present

## 2022-11-15 DIAGNOSIS — F03A2 Unspecified dementia, mild, with psychotic disturbance: Secondary | ICD-10-CM | POA: Diagnosis not present

## 2022-11-15 DIAGNOSIS — F03A3 Unspecified dementia, mild, with mood disturbance: Secondary | ICD-10-CM | POA: Diagnosis not present

## 2022-11-15 DIAGNOSIS — S43004S Unspecified dislocation of right shoulder joint, sequela: Secondary | ICD-10-CM | POA: Diagnosis not present

## 2022-11-15 DIAGNOSIS — F32A Depression, unspecified: Secondary | ICD-10-CM | POA: Diagnosis not present

## 2022-11-15 DIAGNOSIS — R5381 Other malaise: Secondary | ICD-10-CM | POA: Diagnosis not present

## 2022-11-15 DIAGNOSIS — K6389 Other specified diseases of intestine: Secondary | ICD-10-CM | POA: Diagnosis not present

## 2022-11-15 DIAGNOSIS — M6281 Muscle weakness (generalized): Secondary | ICD-10-CM | POA: Diagnosis not present

## 2022-11-15 DIAGNOSIS — R000 Tachycardia, unspecified: Secondary | ICD-10-CM | POA: Diagnosis not present

## 2022-11-15 DIAGNOSIS — M1990 Unspecified osteoarthritis, unspecified site: Secondary | ICD-10-CM | POA: Diagnosis not present

## 2022-11-15 DIAGNOSIS — R918 Other nonspecific abnormal finding of lung field: Secondary | ICD-10-CM | POA: Diagnosis not present

## 2022-11-15 DIAGNOSIS — R6521 Severe sepsis with septic shock: Secondary | ICD-10-CM | POA: Diagnosis not present

## 2022-11-15 DIAGNOSIS — R52 Pain, unspecified: Secondary | ICD-10-CM | POA: Diagnosis not present

## 2022-11-15 DIAGNOSIS — Z87891 Personal history of nicotine dependence: Secondary | ICD-10-CM | POA: Diagnosis not present

## 2022-11-15 DIAGNOSIS — Z7901 Long term (current) use of anticoagulants: Secondary | ICD-10-CM | POA: Diagnosis not present

## 2022-11-15 DIAGNOSIS — Z7401 Bed confinement status: Secondary | ICD-10-CM | POA: Diagnosis not present

## 2022-11-15 DIAGNOSIS — S43006A Unspecified dislocation of unspecified shoulder joint, initial encounter: Secondary | ICD-10-CM | POA: Diagnosis not present

## 2022-11-15 DIAGNOSIS — R112 Nausea with vomiting, unspecified: Secondary | ICD-10-CM | POA: Diagnosis not present

## 2022-11-15 DIAGNOSIS — I129 Hypertensive chronic kidney disease with stage 1 through stage 4 chronic kidney disease, or unspecified chronic kidney disease: Secondary | ICD-10-CM | POA: Diagnosis not present

## 2022-11-15 DIAGNOSIS — N1832 Chronic kidney disease, stage 3b: Secondary | ICD-10-CM | POA: Diagnosis not present

## 2022-11-15 DIAGNOSIS — S43004D Unspecified dislocation of right shoulder joint, subsequent encounter: Secondary | ICD-10-CM | POA: Diagnosis not present

## 2022-11-15 DIAGNOSIS — M109 Gout, unspecified: Secondary | ICD-10-CM | POA: Diagnosis not present

## 2022-11-15 DIAGNOSIS — I480 Paroxysmal atrial fibrillation: Secondary | ICD-10-CM | POA: Diagnosis not present

## 2022-11-15 NOTE — Progress Notes (Signed)
Report given to the nurse at [facility].

## 2022-11-15 NOTE — Progress Notes (Signed)
TRH night cross cover note:   I was notified by RN that patient's HR's are in the 50's, consistent with HR's throughout the day, and that she is due for scheduled dose of metoprolol tartrate this evening.  Systolic blood pressures in the 130s to 140s mmHg, and patient is without any new symptoms at this time.  I asked that the patient's scheduled dose of metoprolol be held this evening.     Babs Bertin, DO Hospitalist

## 2022-11-15 NOTE — TOC Transition Note (Signed)
Transition of Care [facility]) - CM/SW Discharge Note   Patient Details  Name: [name] MRN: [id number] Date of Birth: 06-May-1941  Transition [facility] CM/SW Contact:  Bethann Berkshire, [place] Phone Number: 11/15/2022, 10:22 AM   Clinical Narrative:     Auth approved for [facility] SNF 11/14/22-- 11/18/22. Auth# [id number]  Patient will DC to: [place] Anticipated DC date: 11/15/22 Family notified: Niece [place] Transport by: Corey Harold   Per MD patient ready for DC to [facility]. RN, patient, patient's family, and facility notified of DC. Discharge Summary and FL2 sent to facility. RN to call report prior to discharge [phone] Room 703p). DC packet on chart. Ambulance transport requested for patient.   CSW will sign off for now as social work intervention is no longer needed. Please consult [place] again if new needs arise.   Final next level of care: Skilled Nursing Facility Barriers to Discharge: No Barriers Identified   Patient Goals and CMS Choice        Discharge Placement              Patient chooses bed at: [facility] Patient to be transferred to facility by: [place] Name of family member notified: Niece [place] Patient and family notified of of transfer: 11/15/22  Discharge Plan and Services                                     Social Determinants of Health (SDOH) Interventions     Readmission Risk Interventions     No data to display

## 2022-11-15 NOTE — Progress Notes (Signed)
Patient seen and examined this morning, stable for discharge.  Refer to discharge summary dated 11/30.  Lateisha Thurlow M. Cruzita Lederer, MD, PhD [facility]  Between 7 am - 7 pm you can contact me via Amion (for emergencies) or [place] (non urgent matters).  I am not available 7 pm - 7 am, please contact night coverage MD/APP via Amion

## 2022-11-15 NOTE — Progress Notes (Signed)
PT Cancellation Note  Patient Details Name: RANIYA GOLEMBESKI MRN: [id number] DOB: [birth year]   Cancelled Treatment:    Reason Eval/Treat Not Completed: Other (comment). Transport here about to d/c pt to SNF.   Moishe Spice, PT, DPT Acute Rehabilitation Services  Office: [place] 11/15/2022, 11:14 AM

## 2022-11-15 NOTE — Plan of Care (Signed)
  Problem: Activity: Goal: Ability to tolerate increased activity will improve Outcome: Progressing   Problem: Education: Goal: Knowledge of General Education information will improve Description: Including pain rating scale, medication(s)/side effects and non-pharmacologic comfort measures Outcome: Progressing   Problem: Clinical Measurements: Goal: Will remain free from infection Outcome: Progressing

## 2022-11-18 DIAGNOSIS — S43004S Unspecified dislocation of right shoulder joint, sequela: Secondary | ICD-10-CM | POA: Diagnosis not present

## 2022-11-18 DIAGNOSIS — R52 Pain, unspecified: Secondary | ICD-10-CM | POA: Diagnosis not present

## 2022-11-18 DIAGNOSIS — Z9181 History of falling: Secondary | ICD-10-CM | POA: Diagnosis not present

## 2022-11-18 DIAGNOSIS — R2681 Unsteadiness on feet: Secondary | ICD-10-CM | POA: Diagnosis not present

## 2022-11-18 DIAGNOSIS — R41841 Cognitive communication deficit: Secondary | ICD-10-CM | POA: Diagnosis not present

## 2022-11-18 DIAGNOSIS — I4891 Unspecified atrial fibrillation: Secondary | ICD-10-CM | POA: Diagnosis not present

## 2022-11-18 DIAGNOSIS — R5381 Other malaise: Secondary | ICD-10-CM | POA: Diagnosis not present

## 2022-11-18 DIAGNOSIS — S43003D Unspecified subluxation of unspecified shoulder joint, subsequent encounter: Secondary | ICD-10-CM | POA: Diagnosis not present

## 2022-11-19 DIAGNOSIS — F39 Unspecified mood [affective] disorder: Secondary | ICD-10-CM | POA: Diagnosis not present

## 2022-11-19 DIAGNOSIS — I480 Paroxysmal atrial fibrillation: Secondary | ICD-10-CM | POA: Diagnosis not present

## 2022-11-19 DIAGNOSIS — R55 Syncope and collapse: Secondary | ICD-10-CM | POA: Diagnosis not present

## 2022-11-19 DIAGNOSIS — F32A Depression, unspecified: Secondary | ICD-10-CM | POA: Diagnosis not present

## 2022-11-19 DIAGNOSIS — M1990 Unspecified osteoarthritis, unspecified site: Secondary | ICD-10-CM | POA: Diagnosis not present

## 2022-11-19 DIAGNOSIS — E039 Hypothyroidism, unspecified: Secondary | ICD-10-CM | POA: Diagnosis not present

## 2022-11-19 DIAGNOSIS — N1832 Chronic kidney disease, stage 3b: Secondary | ICD-10-CM | POA: Diagnosis not present

## 2022-11-19 DIAGNOSIS — I150 Renovascular hypertension: Secondary | ICD-10-CM | POA: Diagnosis not present

## 2022-11-19 DIAGNOSIS — M109 Gout, unspecified: Secondary | ICD-10-CM | POA: Diagnosis not present

## 2022-11-20 DIAGNOSIS — F03A11 Unspecified dementia, mild, with agitation: Secondary | ICD-10-CM | POA: Diagnosis not present

## 2022-11-20 DIAGNOSIS — S43004S Unspecified dislocation of right shoulder joint, sequela: Secondary | ICD-10-CM | POA: Diagnosis not present

## 2022-11-20 DIAGNOSIS — R2681 Unsteadiness on feet: Secondary | ICD-10-CM | POA: Diagnosis not present

## 2022-11-20 DIAGNOSIS — F32A Depression, unspecified: Secondary | ICD-10-CM | POA: Diagnosis not present

## 2022-11-20 DIAGNOSIS — R52 Pain, unspecified: Secondary | ICD-10-CM | POA: Diagnosis not present

## 2022-11-20 DIAGNOSIS — F03A3 Unspecified dementia, mild, with mood disturbance: Secondary | ICD-10-CM | POA: Diagnosis not present

## 2022-11-20 DIAGNOSIS — R41841 Cognitive communication deficit: Secondary | ICD-10-CM | POA: Diagnosis not present

## 2022-11-20 DIAGNOSIS — N183 Chronic kidney disease, stage 3 unspecified: Secondary | ICD-10-CM | POA: Diagnosis not present

## 2022-11-20 DIAGNOSIS — I4891 Unspecified atrial fibrillation: Secondary | ICD-10-CM | POA: Diagnosis not present

## 2022-11-20 DIAGNOSIS — F05 Delirium due to known physiological condition: Secondary | ICD-10-CM | POA: Diagnosis not present

## 2022-11-20 DIAGNOSIS — S43003D Unspecified subluxation of unspecified shoulder joint, subsequent encounter: Secondary | ICD-10-CM | POA: Diagnosis not present

## 2022-11-20 DIAGNOSIS — Z9181 History of falling: Secondary | ICD-10-CM | POA: Diagnosis not present

## 2022-11-20 DIAGNOSIS — R5381 Other malaise: Secondary | ICD-10-CM | POA: Diagnosis not present

## 2022-11-20 DIAGNOSIS — F03A2 Unspecified dementia, mild, with psychotic disturbance: Secondary | ICD-10-CM | POA: Diagnosis not present

## 2022-11-20 DIAGNOSIS — S43006A Unspecified dislocation of unspecified shoulder joint, initial encounter: Secondary | ICD-10-CM | POA: Diagnosis not present

## 2022-11-22 ENCOUNTER — Telehealth: Payer: Self-pay

## 2022-11-22 NOTE — Patient Outreach (Signed)
  Care Coordination   Follow up  Visit Note   11/22/2022 Name: [name] MRN: [id number] DOB: 07/25/1941  [name] is a 81 y.o. year old female who sees Merrilee Seashore, MD for primary care. I  reviewed chart.  Patient currently in SNF.  CM will close case.    What matters to the patients health and wellness today?      Goals Addressed             This Visit's Progress    COMPLETED: Hypertension Management       Care Coordination Interventions: Evaluation of current treatment plan related to hypertension self management and patient's adherence to plan as established by provider Reviewed medications with patient and discussed importance of compliance Discussed plans with patient for ongoing care management follow up and provided patient with direct contact information for care management team Patient does monitor blood pressure at home reports less than 140/80.  Patient currently in SNF.  CM will complete goal        SDOH assessments and interventions completed:  No     Care Coordination Interventions:  No, not indicated   Follow up plan: No further intervention required.    Jone Baseman, RN, MSN [place] [facility] Care Management Coordinator Direct Line [phone]

## 2022-11-25 DIAGNOSIS — F331 Major depressive disorder, recurrent, moderate: Secondary | ICD-10-CM | POA: Diagnosis not present

## 2022-11-25 DIAGNOSIS — R5381 Other malaise: Secondary | ICD-10-CM | POA: Diagnosis not present

## 2022-11-25 DIAGNOSIS — S43004S Unspecified dislocation of right shoulder joint, sequela: Secondary | ICD-10-CM | POA: Diagnosis not present

## 2022-11-25 DIAGNOSIS — S43003D Unspecified subluxation of unspecified shoulder joint, subsequent encounter: Secondary | ICD-10-CM | POA: Diagnosis not present

## 2022-11-25 DIAGNOSIS — R2681 Unsteadiness on feet: Secondary | ICD-10-CM | POA: Diagnosis not present

## 2022-11-25 DIAGNOSIS — R41841 Cognitive communication deficit: Secondary | ICD-10-CM | POA: Diagnosis not present

## 2022-11-25 DIAGNOSIS — Z9181 History of falling: Secondary | ICD-10-CM | POA: Diagnosis not present

## 2022-11-25 DIAGNOSIS — R52 Pain, unspecified: Secondary | ICD-10-CM | POA: Diagnosis not present

## 2022-11-25 DIAGNOSIS — I4891 Unspecified atrial fibrillation: Secondary | ICD-10-CM | POA: Diagnosis not present

## 2022-11-26 DIAGNOSIS — I480 Paroxysmal atrial fibrillation: Secondary | ICD-10-CM | POA: Diagnosis not present

## 2022-11-26 DIAGNOSIS — S43004A Unspecified dislocation of right shoulder joint, initial encounter: Secondary | ICD-10-CM | POA: Diagnosis not present

## 2022-11-27 DIAGNOSIS — R52 Pain, unspecified: Secondary | ICD-10-CM | POA: Diagnosis not present

## 2022-11-27 DIAGNOSIS — S43004S Unspecified dislocation of right shoulder joint, sequela: Secondary | ICD-10-CM | POA: Diagnosis not present

## 2022-11-27 DIAGNOSIS — R41841 Cognitive communication deficit: Secondary | ICD-10-CM | POA: Diagnosis not present

## 2022-11-27 DIAGNOSIS — R5381 Other malaise: Secondary | ICD-10-CM | POA: Diagnosis not present

## 2022-11-27 DIAGNOSIS — R2681 Unsteadiness on feet: Secondary | ICD-10-CM | POA: Diagnosis not present

## 2022-11-27 DIAGNOSIS — I4891 Unspecified atrial fibrillation: Secondary | ICD-10-CM | POA: Diagnosis not present

## 2022-11-27 DIAGNOSIS — Z9181 History of falling: Secondary | ICD-10-CM | POA: Diagnosis not present

## 2022-11-27 DIAGNOSIS — S43003D Unspecified subluxation of unspecified shoulder joint, subsequent encounter: Secondary | ICD-10-CM | POA: Diagnosis not present

## 2022-11-28 ENCOUNTER — Ambulatory Visit (INDEPENDENT_AMBULATORY_CARE_PROVIDER_SITE_OTHER): Payer: Medicare HMO

## 2022-11-28 ENCOUNTER — Ambulatory Visit (INDEPENDENT_AMBULATORY_CARE_PROVIDER_SITE_OTHER): Payer: Medicare HMO | Admitting: Physician Assistant

## 2022-11-28 ENCOUNTER — Encounter: Payer: Self-pay | Admitting: Physician Assistant

## 2022-11-28 DIAGNOSIS — M25511 Pain in right shoulder: Secondary | ICD-10-CM | POA: Diagnosis not present

## 2022-11-28 NOTE — Progress Notes (Signed)
Office Visit Note   Patient: [name]           Date of Birth: 03-27-1941           MRN: [id number] Visit Date: 11/28/2022              Requested by: Merrilee Seashore, [address] [place],  [place] [id number] PCP: Merrilee Seashore, MD  Chief Complaint  Patient presents with   Right Shoulder - Pain      HPI: Patient is a pleasant 81 year old woman who presents today from [place] home.  She is 3 weeks status post fall.  She was hospitalized.  She does not remember much about the fall.  At the time she did have an anterior shoulder dislocation which was reduced.  She denies any previous history of troubles with her right shoulder.  Since then she has been immobilized in a sling  Assessment & Plan: Visit Diagnoses:  1. Acute pain of right shoulder     Plan: Status post right shoulder dislocation.  She is now 3 weeks from the injury.  I recommended to her nursing facility that she should remain in the sling for another week or 2 but they could begin physical therapy she will follow-up in 3 weeks  Follow-Up Instructions: No follow-ups on file.   Ortho Exam  Patient is alert, oriented, no adenopathy, well-dressed, normal affect, normal respiratory effort. Examination of her right arm she has good grip strength pulses intact she has good extension and flexion of her arm.  She is able to hold her arm up independently without pain at about 100 degrees.  She has quite a bit of stiffness and can forward elevate to about 110 degrees.  No noted ecchymosis.  No clinical deformity  Imaging: No results found. No images are attached to the encounter.  Labs: Lab Results  Component Value Date   HGBA1C 5.5 12/14/2019   HGBA1C 5.4 12/07/2018   HGBA1C 5.6 12/05/2017   LABURIC 8.1 (H) 06/07/2022   LABURIC 5.7 01/11/2013       Lab Results  Component Value Date   MG 1.8 06/05/2022   MG 1.6 01/29/2011   MG 1.7 01/26/2011   Lab Results  Component  Value Date   VD25OH 35 12/05/2017   VD25OH 25 (L) 09/16/2016   VD25OH 23 (L) 09/07/2015        Latest Ref Rng & Units 11/14/2022   12:18 AM 11/13/2022   12:21 AM 11/12/2022    3:30 AM  CBC EXTENDED  WBC 4.0 - 10.5 K/uL 10.2  9.9  14.5   RBC 3.87 - 5.11 MIL/uL 4.43  3.89  4.89   Hemoglobin 12.0 - 15.0 g/dL 12.9  11.4  14.0   HCT 36.0 - 46.0 % 37.9  33.7  43.0   Platelets 150 - 400 K/uL 165  151  169   NEUT# 1.7 - 7.7 K/uL  6.9    Lymph# 0.7 - 4.0 K/uL  1.9       There is no height or weight on file to calculate BMI.  Orders:  Orders Placed This Encounter  Procedures   XR Shoulder Right   No orders of the defined types were placed in this encounter.    Procedures: No procedures performed  Clinical Data: No additional findings.  ROS:  All other systems negative, except as noted in the HPI. Review of Systems  Objective: Vital Signs: There were no vitals taken for this visit.  Specialty Comments:  No specialty comments available.  PMFS History: Patient Active Problem List   Diagnosis Date Noted   Atrial fibrillation with RVR ([place]) 11/11/2022   Fall at home, initial encounter 11/11/2022   B12 deficiency 06/07/2022   Hypothyroidism 06/06/2022   Chronic kidney disease, stage 3b ([place]) 06/06/2022   Low back pain 06/06/2022   QT prolongation 06/06/2022   Hypokalemia 06/06/2022   Syncope and collapse 06/06/2022   Syncope 06/05/2022   Elevated uric acid in blood 11/13/2012   Right thyroid nodule 11/13/2012   Exocrine pancreatic insufficiency 01/07/2012   Hyperlipidemia 08/17/2011   Hypertension 08/17/2011   Osteoarthritis 08/17/2011   Pancreatic adenoma 08/17/2011   Past Medical History:  Diagnosis Date   Arthritis    Colon polyp    Hx of gout    Hyperlipidemia    Hypertension    Hyperthyroidism    MVP (mitral valve prolapse)    Renal insufficiency    Thyroid disease     Family History  Problem Relation Age of Onset   Heart disease Father     Diabetes Sister    Cancer Brother        caused by agent orange.   Cancer Paternal Aunt        breast    Past Surgical History:  Procedure Laterality Date   BACK SURGERY  2011   LOWER BACK FUSION L4-5   CHOLECYSTECTOMY     microcystic adenoma  02/12   pancreas   NECK SURGERY  2009   NECK FUSION C5-6; C6-7   TOTAL HIP ARTHROPLASTY  01/15/2012   Procedure: TOTAL HIP ARTHROPLASTY;  Surgeon: Gearlean Alf, MD;  Location: [facility];  Service: Orthopedics;  Laterality: Right;   TOTAL HIP ARTHROPLASTY  05/22/2012   Procedure: TOTAL HIP ARTHROPLASTY;  Surgeon: Gearlean Alf, MD;  Location: [facility];  Service: Orthopedics;  Laterality: Left;   Social History   Occupational History   Not on file  Tobacco Use   Smoking status: Former    Packs/day: 0.25    Types: Cigarettes    Quit date: 08/15/1965    Years since quitting: 57.3   Smokeless tobacco: Never  Substance and Sexual Activity   Alcohol use: No   Drug use: No   Sexual activity: Not on file

## 2022-12-02 DIAGNOSIS — R2681 Unsteadiness on feet: Secondary | ICD-10-CM | POA: Diagnosis not present

## 2022-12-02 DIAGNOSIS — Z9181 History of falling: Secondary | ICD-10-CM | POA: Diagnosis not present

## 2022-12-02 DIAGNOSIS — R41841 Cognitive communication deficit: Secondary | ICD-10-CM | POA: Diagnosis not present

## 2022-12-02 DIAGNOSIS — S43003D Unspecified subluxation of unspecified shoulder joint, subsequent encounter: Secondary | ICD-10-CM | POA: Diagnosis not present

## 2022-12-02 DIAGNOSIS — I4891 Unspecified atrial fibrillation: Secondary | ICD-10-CM | POA: Diagnosis not present

## 2022-12-02 DIAGNOSIS — R52 Pain, unspecified: Secondary | ICD-10-CM | POA: Diagnosis not present

## 2022-12-02 DIAGNOSIS — S43004S Unspecified dislocation of right shoulder joint, sequela: Secondary | ICD-10-CM | POA: Diagnosis not present

## 2022-12-02 DIAGNOSIS — R5381 Other malaise: Secondary | ICD-10-CM | POA: Diagnosis not present

## 2022-12-03 DIAGNOSIS — R112 Nausea with vomiting, unspecified: Secondary | ICD-10-CM | POA: Diagnosis not present

## 2022-12-03 DIAGNOSIS — R0981 Nasal congestion: Secondary | ICD-10-CM | POA: Diagnosis not present

## 2022-12-04 DIAGNOSIS — R2681 Unsteadiness on feet: Secondary | ICD-10-CM | POA: Diagnosis not present

## 2022-12-04 DIAGNOSIS — R52 Pain, unspecified: Secondary | ICD-10-CM | POA: Diagnosis not present

## 2022-12-04 DIAGNOSIS — I4891 Unspecified atrial fibrillation: Secondary | ICD-10-CM | POA: Diagnosis not present

## 2022-12-04 DIAGNOSIS — R5381 Other malaise: Secondary | ICD-10-CM | POA: Diagnosis not present

## 2022-12-04 DIAGNOSIS — S43004S Unspecified dislocation of right shoulder joint, sequela: Secondary | ICD-10-CM | POA: Diagnosis not present

## 2022-12-04 DIAGNOSIS — R41841 Cognitive communication deficit: Secondary | ICD-10-CM | POA: Diagnosis not present

## 2022-12-04 DIAGNOSIS — Z9181 History of falling: Secondary | ICD-10-CM | POA: Diagnosis not present

## 2022-12-04 DIAGNOSIS — S43003D Unspecified subluxation of unspecified shoulder joint, subsequent encounter: Secondary | ICD-10-CM | POA: Diagnosis not present

## 2022-12-11 DIAGNOSIS — I4891 Unspecified atrial fibrillation: Secondary | ICD-10-CM | POA: Diagnosis not present

## 2022-12-11 DIAGNOSIS — R41841 Cognitive communication deficit: Secondary | ICD-10-CM | POA: Diagnosis not present

## 2022-12-11 DIAGNOSIS — R52 Pain, unspecified: Secondary | ICD-10-CM | POA: Diagnosis not present

## 2022-12-11 DIAGNOSIS — R2681 Unsteadiness on feet: Secondary | ICD-10-CM | POA: Diagnosis not present

## 2022-12-11 DIAGNOSIS — R5381 Other malaise: Secondary | ICD-10-CM | POA: Diagnosis not present

## 2022-12-11 DIAGNOSIS — Z9181 History of falling: Secondary | ICD-10-CM | POA: Diagnosis not present

## 2022-12-11 DIAGNOSIS — S43004S Unspecified dislocation of right shoulder joint, sequela: Secondary | ICD-10-CM | POA: Diagnosis not present

## 2022-12-11 DIAGNOSIS — S43003D Unspecified subluxation of unspecified shoulder joint, subsequent encounter: Secondary | ICD-10-CM | POA: Diagnosis not present

## 2022-12-13 DIAGNOSIS — S43003D Unspecified subluxation of unspecified shoulder joint, subsequent encounter: Secondary | ICD-10-CM | POA: Diagnosis not present

## 2022-12-13 DIAGNOSIS — I480 Paroxysmal atrial fibrillation: Secondary | ICD-10-CM | POA: Diagnosis not present

## 2022-12-13 DIAGNOSIS — Z7901 Long term (current) use of anticoagulants: Secondary | ICD-10-CM | POA: Diagnosis not present

## 2022-12-13 DIAGNOSIS — R52 Pain, unspecified: Secondary | ICD-10-CM | POA: Diagnosis not present

## 2022-12-13 DIAGNOSIS — R630 Anorexia: Secondary | ICD-10-CM | POA: Diagnosis not present

## 2022-12-13 DIAGNOSIS — F03A0 Unspecified dementia, mild, without behavioral disturbance, psychotic disturbance, mood disturbance, and anxiety: Secondary | ICD-10-CM | POA: Diagnosis not present

## 2022-12-13 DIAGNOSIS — I150 Renovascular hypertension: Secondary | ICD-10-CM | POA: Diagnosis not present

## 2022-12-13 DIAGNOSIS — Z9181 History of falling: Secondary | ICD-10-CM | POA: Diagnosis not present

## 2022-12-13 DIAGNOSIS — E039 Hypothyroidism, unspecified: Secondary | ICD-10-CM | POA: Diagnosis not present

## 2022-12-13 DIAGNOSIS — S43004S Unspecified dislocation of right shoulder joint, sequela: Secondary | ICD-10-CM | POA: Diagnosis not present

## 2022-12-13 DIAGNOSIS — R112 Nausea with vomiting, unspecified: Secondary | ICD-10-CM | POA: Diagnosis not present

## 2022-12-13 DIAGNOSIS — R5381 Other malaise: Secondary | ICD-10-CM | POA: Diagnosis not present

## 2022-12-13 DIAGNOSIS — Z6828 Body mass index (BMI) 28.0-28.9, adult: Secondary | ICD-10-CM | POA: Diagnosis not present

## 2022-12-13 DIAGNOSIS — S43004D Unspecified dislocation of right shoulder joint, subsequent encounter: Secondary | ICD-10-CM | POA: Diagnosis not present

## 2022-12-13 DIAGNOSIS — R2681 Unsteadiness on feet: Secondary | ICD-10-CM | POA: Diagnosis not present

## 2022-12-13 DIAGNOSIS — I4891 Unspecified atrial fibrillation: Secondary | ICD-10-CM | POA: Diagnosis not present

## 2022-12-13 DIAGNOSIS — R41841 Cognitive communication deficit: Secondary | ICD-10-CM | POA: Diagnosis not present

## 2022-12-14 ENCOUNTER — Inpatient Hospital Stay (HOSPITAL_COMMUNITY)
Admission: EM | Admit: 2022-12-14 | Discharge: 2022-12-16 | DRG: 871 | Disposition: E | Payer: Medicare HMO | Source: Skilled Nursing Facility | Attending: Pulmonary Disease | Admitting: Pulmonary Disease

## 2022-12-14 ENCOUNTER — Emergency Department (HOSPITAL_COMMUNITY): Payer: Medicare HMO

## 2022-12-14 ENCOUNTER — Other Ambulatory Visit: Payer: Self-pay

## 2022-12-14 ENCOUNTER — Inpatient Hospital Stay (HOSPITAL_COMMUNITY): Payer: Medicare HMO

## 2022-12-14 DIAGNOSIS — I4891 Unspecified atrial fibrillation: Secondary | ICD-10-CM | POA: Diagnosis present

## 2022-12-14 DIAGNOSIS — J9601 Acute respiratory failure with hypoxia: Secondary | ICD-10-CM | POA: Diagnosis present

## 2022-12-14 DIAGNOSIS — Z8249 Family history of ischemic heart disease and other diseases of the circulatory system: Secondary | ICD-10-CM

## 2022-12-14 DIAGNOSIS — I341 Nonrheumatic mitral (valve) prolapse: Secondary | ICD-10-CM | POA: Diagnosis present

## 2022-12-14 DIAGNOSIS — G9341 Metabolic encephalopathy: Secondary | ICD-10-CM | POA: Diagnosis not present

## 2022-12-14 DIAGNOSIS — I469 Cardiac arrest, cause unspecified: Secondary | ICD-10-CM | POA: Diagnosis not present

## 2022-12-14 DIAGNOSIS — B964 Proteus (mirabilis) (morganii) as the cause of diseases classified elsewhere: Secondary | ICD-10-CM | POA: Diagnosis present

## 2022-12-14 DIAGNOSIS — K3189 Other diseases of stomach and duodenum: Secondary | ICD-10-CM | POA: Diagnosis not present

## 2022-12-14 DIAGNOSIS — E039 Hypothyroidism, unspecified: Secondary | ICD-10-CM | POA: Diagnosis not present

## 2022-12-14 DIAGNOSIS — A419 Sepsis, unspecified organism: Secondary | ICD-10-CM

## 2022-12-14 DIAGNOSIS — R4182 Altered mental status, unspecified: Secondary | ICD-10-CM | POA: Diagnosis not present

## 2022-12-14 DIAGNOSIS — R000 Tachycardia, unspecified: Secondary | ICD-10-CM | POA: Diagnosis not present

## 2022-12-14 DIAGNOSIS — B9689 Other specified bacterial agents as the cause of diseases classified elsewhere: Secondary | ICD-10-CM | POA: Diagnosis present

## 2022-12-14 DIAGNOSIS — J1282 Pneumonia due to coronavirus disease 2019: Secondary | ICD-10-CM | POA: Diagnosis not present

## 2022-12-14 DIAGNOSIS — I444 Left anterior fascicular block: Secondary | ICD-10-CM | POA: Diagnosis present

## 2022-12-14 DIAGNOSIS — K6389 Other specified diseases of intestine: Secondary | ICD-10-CM | POA: Diagnosis not present

## 2022-12-14 DIAGNOSIS — K5939 Other megacolon: Secondary | ICD-10-CM | POA: Diagnosis not present

## 2022-12-14 DIAGNOSIS — Z885 Allergy status to narcotic agent status: Secondary | ICD-10-CM

## 2022-12-14 DIAGNOSIS — Z79899 Other long term (current) drug therapy: Secondary | ICD-10-CM

## 2022-12-14 DIAGNOSIS — J9811 Atelectasis: Secondary | ICD-10-CM | POA: Diagnosis not present

## 2022-12-14 DIAGNOSIS — R531 Weakness: Secondary | ICD-10-CM | POA: Diagnosis not present

## 2022-12-14 DIAGNOSIS — E785 Hyperlipidemia, unspecified: Secondary | ICD-10-CM | POA: Diagnosis present

## 2022-12-14 DIAGNOSIS — Z981 Arthrodesis status: Secondary | ICD-10-CM

## 2022-12-14 DIAGNOSIS — Z833 Family history of diabetes mellitus: Secondary | ICD-10-CM

## 2022-12-14 DIAGNOSIS — Z87891 Personal history of nicotine dependence: Secondary | ICD-10-CM | POA: Diagnosis not present

## 2022-12-14 DIAGNOSIS — Z7901 Long term (current) use of anticoagulants: Secondary | ICD-10-CM | POA: Diagnosis not present

## 2022-12-14 DIAGNOSIS — Z8719 Personal history of other diseases of the digestive system: Secondary | ICD-10-CM

## 2022-12-14 DIAGNOSIS — N179 Acute kidney failure, unspecified: Secondary | ICD-10-CM

## 2022-12-14 DIAGNOSIS — Z96643 Presence of artificial hip joint, bilateral: Secondary | ICD-10-CM | POA: Diagnosis present

## 2022-12-14 DIAGNOSIS — M109 Gout, unspecified: Secondary | ICD-10-CM | POA: Diagnosis present

## 2022-12-14 DIAGNOSIS — I129 Hypertensive chronic kidney disease with stage 1 through stage 4 chronic kidney disease, or unspecified chronic kidney disease: Secondary | ICD-10-CM | POA: Diagnosis present

## 2022-12-14 DIAGNOSIS — U071 COVID-19: Secondary | ICD-10-CM | POA: Diagnosis not present

## 2022-12-14 DIAGNOSIS — F32A Depression, unspecified: Secondary | ICD-10-CM | POA: Diagnosis present

## 2022-12-14 DIAGNOSIS — A4102 Sepsis due to Methicillin resistant Staphylococcus aureus: Principal | ICD-10-CM | POA: Diagnosis present

## 2022-12-14 DIAGNOSIS — Z4682 Encounter for fitting and adjustment of non-vascular catheter: Secondary | ICD-10-CM | POA: Diagnosis not present

## 2022-12-14 DIAGNOSIS — R55 Syncope and collapse: Secondary | ICD-10-CM | POA: Diagnosis not present

## 2022-12-14 DIAGNOSIS — R6521 Severe sepsis with septic shock: Secondary | ICD-10-CM | POA: Diagnosis not present

## 2022-12-14 DIAGNOSIS — R918 Other nonspecific abnormal finding of lung field: Secondary | ICD-10-CM | POA: Diagnosis not present

## 2022-12-14 DIAGNOSIS — N1832 Chronic kidney disease, stage 3b: Secondary | ICD-10-CM | POA: Diagnosis present

## 2022-12-14 DIAGNOSIS — Z79890 Hormone replacement therapy (postmenopausal): Secondary | ICD-10-CM

## 2022-12-14 DIAGNOSIS — Z9049 Acquired absence of other specified parts of digestive tract: Secondary | ICD-10-CM

## 2022-12-14 DIAGNOSIS — F419 Anxiety disorder, unspecified: Secondary | ICD-10-CM | POA: Diagnosis present

## 2022-12-14 DIAGNOSIS — N390 Urinary tract infection, site not specified: Secondary | ICD-10-CM | POA: Diagnosis present

## 2022-12-14 LAB — COMPREHENSIVE METABOLIC PANEL
ALT: 16 U/L (ref 0–44)
AST: 52 U/L — ABNORMAL HIGH (ref 15–41)
Albumin: 2.4 g/dL — ABNORMAL LOW (ref 3.5–5.0)
Alkaline Phosphatase: 88 U/L (ref 38–126)
Anion gap: 25 — ABNORMAL HIGH (ref 5–15)
BUN: 47 mg/dL — ABNORMAL HIGH (ref 8–23)
CO2: 7 mmol/L — ABNORMAL LOW (ref 22–32)
Calcium: 10.6 mg/dL — ABNORMAL HIGH (ref 8.9–10.3)
Chloride: 105 mmol/L (ref 98–111)
Creatinine, Ser: 4.03 mg/dL — ABNORMAL HIGH (ref 0.44–1.00)
GFR, Estimated: 11 mL/min — ABNORMAL LOW (ref >60)
Glucose, Bld: 210 mg/dL — ABNORMAL HIGH (ref 70–99)
Potassium: 4.7 mmol/L (ref 3.5–5.1)
Sodium: 137 mmol/L (ref 135–145)
Total Bilirubin: 0.6 mg/dL (ref 0.3–1.2)
Total Protein: 6.1 g/dL — ABNORMAL LOW (ref 6.5–8.1)

## 2022-12-14 LAB — CBC WITH DIFFERENTIAL/PLATELET
Abs Immature Granulocytes: 0.21 10*3/uL — ABNORMAL HIGH (ref 0.00–0.07)
Basophils Absolute: 0.1 10*3/uL (ref 0.0–0.1)
Basophils Relative: 1 %
Eosinophils Absolute: 0.1 10*3/uL (ref 0.0–0.5)
Eosinophils Relative: 1 %
HCT: 48.3 % — ABNORMAL HIGH (ref 36.0–46.0)
Hemoglobin: 14.9 g/dL (ref 12.0–15.0)
Immature Granulocytes: 1 %
Lymphocytes Relative: 13 %
Lymphs Abs: 2.4 10*3/uL (ref 0.7–4.0)
MCH: 29.6 pg (ref 26.0–34.0)
MCHC: 30.8 g/dL (ref 30.0–36.0)
MCV: 95.8 fL (ref 80.0–100.0)
Monocytes Absolute: 0.7 10*3/uL (ref 0.1–1.0)
Monocytes Relative: 4 %
Neutro Abs: 14.3 10*3/uL — ABNORMAL HIGH (ref 1.7–7.7)
Neutrophils Relative %: 80 %
Platelets: 345 10*3/uL (ref 150–400)
RBC: 5.04 MIL/uL (ref 3.87–5.11)
RDW: 14.5 % (ref 11.5–15.5)
Smear Review: NORMAL
WBC: 17.9 10*3/uL — ABNORMAL HIGH (ref 4.0–10.5)
nRBC: 0.2 % (ref 0.0–0.2)

## 2022-12-14 LAB — LACTIC ACID, PLASMA: Lactic Acid, Venous: >9 mmol/L (ref 0.5–1.9)

## 2022-12-14 LAB — PROTIME-INR
INR: 3.1 — ABNORMAL HIGH (ref 0.8–1.2)
Prothrombin Time: 32 seconds — ABNORMAL HIGH (ref 11.4–15.2)

## 2022-12-14 LAB — LIPASE, BLOOD: Lipase: 42 U/L (ref 11–51)

## 2022-12-14 LAB — APTT: aPTT: 45 seconds — ABNORMAL HIGH (ref 24–36)

## 2022-12-14 LAB — D-DIMER, QUANTITATIVE: D-Dimer, Quant: 6.31 ug/mL-FEU — ABNORMAL HIGH (ref 0.00–0.50)

## 2022-12-14 LAB — C-REACTIVE PROTEIN: CRP: 35.7 mg/dL — ABNORMAL HIGH (ref <1.0)

## 2022-12-14 MED ORDER — DOCUSATE SODIUM 50 MG/5ML PO LIQD
100.0000 mg | Freq: Two times a day (BID) | ORAL | Status: DC
  Administered 2022-12-15: 100 mg
  Filled 2022-12-14: qty 10

## 2022-12-14 MED ORDER — LACTATED RINGERS IV BOLUS
1000.0000 mL | Freq: Once | INTRAVENOUS | Status: AC
  Administered 2022-12-14: 1000 mL via INTRAVENOUS

## 2022-12-14 MED ORDER — LEVOTHYROXINE SODIUM 25 MCG PO TABS: 50.0000 ug | ORAL_TABLET | Freq: Every day | ORAL | Status: DC

## 2022-12-14 MED ORDER — DOCUSATE SODIUM 50 MG/5ML PO LIQD: 100.0000 mg | Freq: Two times a day (BID) | ORAL | Status: DC | PRN

## 2022-12-14 MED ORDER — PREDNISONE 20 MG PO TABS: 50.0000 mg | ORAL_TABLET | Freq: Every day | ORAL | Status: DC

## 2022-12-14 MED ORDER — SODIUM CHLORIDE 0.9 % IV SOLN
200.0000 mg | Freq: Once | INTRAVENOUS | Status: DC
  Filled 2022-12-14: qty 40

## 2022-12-14 MED ORDER — SODIUM CHLORIDE 0.9 % IV SOLN: 2.0000 g | Freq: Once | INTRAVENOUS | Status: DC

## 2022-12-14 MED ORDER — SODIUM CHLORIDE 0.9 % IV SOLN: 100.0000 mg | Freq: Every day | INTRAVENOUS | Status: DC

## 2022-12-14 MED ORDER — PHENYLEPHRINE 80 MCG/ML (10ML) SYRINGE FOR IV PUSH (FOR BLOOD PRESSURE SUPPORT)
PREFILLED_SYRINGE | INTRAVENOUS | Status: AC
  Administered 2022-12-14: 80 ug via INTRAVENOUS
  Filled 2022-12-14: qty 10

## 2022-12-14 MED ORDER — POLYETHYLENE GLYCOL 3350 17 G PO PACK: 17.0000 g | PACK | Freq: Every day | ORAL | Status: DC

## 2022-12-14 MED ORDER — FAMOTIDINE 20 MG PO TABS: 20.0000 mg | ORAL_TABLET | Freq: Every day | ORAL | Status: DC

## 2022-12-14 MED ORDER — VANCOMYCIN HCL IN DEXTROSE 1-5 GM/200ML-% IV SOLN: 1000.0000 mg | Freq: Once | INTRAVENOUS | Status: DC

## 2022-12-14 MED ORDER — PHENYLEPHRINE 80 MCG/ML (10ML) SYRINGE FOR IV PUSH (FOR BLOOD PRESSURE SUPPORT): 80.0000 ug | PREFILLED_SYRINGE | Freq: Once | INTRAVENOUS | Status: AC | PRN

## 2022-12-14 MED ORDER — PHENYLEPHRINE 80 MCG/ML (10ML) SYRINGE FOR IV PUSH (FOR BLOOD PRESSURE SUPPORT): 200.0000 ug | PREFILLED_SYRINGE | Freq: Once | INTRAVENOUS | Status: DC | PRN

## 2022-12-14 MED ORDER — METHYLPREDNISOLONE SODIUM SUCC 125 MG IJ SOLR
1.0000 mg/kg | Freq: Two times a day (BID) | INTRAMUSCULAR | Status: DC
  Administered 2022-12-15: 76.875 mg via INTRAVENOUS
  Filled 2022-12-14: qty 2

## 2022-12-14 MED ORDER — POLYETHYLENE GLYCOL 3350 17 G PO PACK: 17.0000 g | PACK | Freq: Every day | ORAL | Status: DC | PRN

## 2022-12-14 MED ORDER — PHENYLEPHRINE 80 MCG/ML (10ML) SYRINGE FOR IV PUSH (FOR BLOOD PRESSURE SUPPORT): 80.0000 ug | PREFILLED_SYRINGE | Freq: Once | INTRAVENOUS | Status: DC | PRN

## 2022-12-14 MED ORDER — SUCCINYLCHOLINE CHLORIDE 20 MG/ML IJ SOLN
INTRAMUSCULAR | Status: DC | PRN
  Administered 2022-12-14: 80 mg via INTRAVENOUS

## 2022-12-14 MED ORDER — FENTANYL CITRATE PF 50 MCG/ML IJ SOSY: 25.0000 ug | PREFILLED_SYRINGE | INTRAMUSCULAR | Status: DC | PRN

## 2022-12-14 MED ORDER — SODIUM CHLORIDE 0.9 % IV SOLN
500.0000 mg | INTRAVENOUS | Status: DC
  Administered 2022-12-15: 500 mg via INTRAVENOUS
  Filled 2022-12-14: qty 5

## 2022-12-14 MED ORDER — ACETAMINOPHEN 650 MG RE SUPP
650.0000 mg | Freq: Once | RECTAL | Status: AC
  Administered 2022-12-14: 650 mg via RECTAL
  Filled 2022-12-14: qty 1

## 2022-12-14 MED ORDER — ETOMIDATE 2 MG/ML IV SOLN
INTRAVENOUS | Status: DC | PRN
  Administered 2022-12-14: 20 mg via INTRAVENOUS

## 2022-12-14 MED ORDER — IPRATROPIUM-ALBUTEROL 0.5-2.5 (3) MG/3ML IN SOLN: 3.0000 mL | Freq: Four times a day (QID) | RESPIRATORY_TRACT | Status: DC | PRN

## 2022-12-14 MED ORDER — SODIUM CHLORIDE 0.9 % IV SOLN
2.0000 g | INTRAVENOUS | Status: DC
  Administered 2022-12-15: 2 g via INTRAVENOUS
  Filled 2022-12-14: qty 20

## 2022-12-14 MED ORDER — FENTANYL CITRATE PF 50 MCG/ML IJ SOSY
25.0000 ug | PREFILLED_SYRINGE | INTRAMUSCULAR | Status: DC | PRN
  Administered 2022-12-15: 50 ug via INTRAVENOUS
  Administered 2022-12-15: 100 ug via INTRAVENOUS
  Filled 2022-12-14: qty 1
  Filled 2022-12-14: qty 2

## 2022-12-14 NOTE — ED Triage Notes (Signed)
Presents via EMS for respiratory arrest. [facility] called for resp distress . Pt was found to be covid positive today and started on new supplemental O2. EMS arrived to pt mottled, her baseline confused, O2sat 80%, and rhonchi bilaterally. Provided a neb of albuterol and atrovent. No improvement. Started cpap for 15 min but pt became unresponsive during this. BVM used to ventilate for 15 min pta.  CBG 174 with MES, initial BP 113/92

## 2022-12-14 NOTE — H&P (Incomplete)
NAME:  [name], MRN:  [id number], DOB:  01-23-1941, LOS: 0 ADMISSION DATE:  12/12/2022, CONSULTATION DATE:  12/30 REFERRING MD:  Dr. Ronnald Nian, CHIEF COMPLAINT:  Acute respiraotry fialure   History of Present Illness:  Patient is a 81 yo Female w/ pertinent PMH afib on eliquis, htn, hypothyroidism presents to [facility] on 12/30 w/ SOB.  Recently admitted 11/11/2022 after fall after syncope on right shoulder which was dislocated. Found to be in afib rvr with no prior history. Syncope thought related to orthostatic hypotension. Discharged 11/14/2022 to SNF.  Recently tested positive for covid. On 12/30 patient w/ increased wob. EMS arrived and placed patient on bipap. Transferred to [facility].   Upon arrival to [facility], patient obtunded on bipap. Intubated for airway protection. CXR showing ETT good position and multifocal pneumonia. Labs/imaging pending. Started on remdesivir and steroids. Panculture sent. Started on broad spectrum abx. PCCM consulted for icu admission.   Pertinent  Medical History   Past Medical History:  Diagnosis Date   Arthritis    Colon polyp    Hx of gout    Hyperlipidemia    Hypertension    Hyperthyroidism    MVP (mitral valve prolapse)    Renal insufficiency    Thyroid disease      Significant Hospital Events: Including procedures, antibiotic start and stop dates in addition to other pertinent events   12/30: admitted AMS and respiratory failure intubated for airway protection  Interim History / Subjective:  ***  Objective   Blood pressure (!) 109/91, pulse (!) 130, resp. rate 20, height '5\' 4"'$  (1.626 m), SpO2 100 %.    Vent Mode: PRVC FiO2 (%):  [100 %] 100 % Set Rate:  [20 bmp] 20 bmp Vt Set:  [450 mL] 450 mL PEEP:  [6 cmH20] 6 cmH20  No intake or output data in the 24 hours ending 11/20/2022 2254 There were no vitals filed for this visit.  Examination: General: *** HENT: *** Lungs: *** Cardiovascular: *** Abdomen: *** Extremities: *** Neuro:  *** GU: ***  Resolved Hospital Problem list     Assessment & Plan:  Acute respiratory failure: intubated for poor GCS Covid positive Multifocal pneumonia Plan: -airborne precautions -LTVV strategy with tidal volumes of 6-8 cc/kg ideal body weight -check ABG and adjust settings accordingly  -Goal plateau pressures less than 30 and driving pressures less than 15 -Wean PEEP/FiO2 for SpO2 >92% -VAP bundle in place -Daily SAT and SBT -PAD protocol in place -wean sedation for RASS goal 0 to -1 -send tracheal aspirate -covid/flu/rsv, RVP, urine legionella/strep -rocephin/azithro for cap ppx -cont remdesivir and steroids  Acute encephalopathy -likely due to co2 retention vs. hypoxia, abg pending; covid? Plan: -check abg  -check tsh, ammonia, uds, ethanol -CT head -treat sepsis below -limit sedating meds -consider EEG if above negative given home wellbutrin use  Sepsis -covid positive Plan: -panculture -trend LA -1L IV fluids given; consider more fluids pending LA -abx as above for cap ppx -check pct -check rvp, urine leg/strept -trend wbc/fever curve  Afib on AC Plan: -telemetry monitoring -resume heparin gtt if ct head negative for bleed -hold metoprolol  CKD III B Plan: -Trend BMP / urinary output -Replace electrolytes as indicated -Avoid nephrotoxic agents, ensure adequate renal perfusion  Hypothyroidism Plan: -tsh -resume synthroid  HTN Plan: -hold metoprolol and norvasc  Gout Plan: -hold allopurinol  Depression/anxiety Plan: -hold home sertraline/wellbutrin  Best Practice (right click and "Reselect all SmartList Selections" daily)   Diet/type: NPO w/ meds via tube  DVT prophylaxis: SCD heparin gtt if CT head showing no bleed GI prophylaxis: H2B Lines: N/A Foley:  N/A Code Status:  {Code Status:26939} Last date of multidisciplinary goals of care discussion [***]  Labs   CBC: Recent Labs  Lab 12/05/2022 2220  WBC 17.9*  NEUTROABS  PENDING  HGB 14.9  HCT 48.3*  MCV 95.8  PLT 384    Basic Metabolic Panel: No results for input(s): "NA", "K", "CL", "CO2", "GLUCOSE", "BUN", "CREATININE", "CALCIUM", "MG", "PHOS" in the last 168 hours. GFR: CrCl cannot be calculated (Patient's most recent lab result is older than the maximum 21 days allowed.). Recent Labs  Lab 11/21/2022 2220  WBC 17.9*    Liver Function Tests: No results for input(s): "AST", "ALT", "ALKPHOS", "BILITOT", "PROT", "ALBUMIN" in the last 168 hours. No results for input(s): "LIPASE", "AMYLASE" in the last 168 hours. No results for input(s): "AMMONIA" in the last 168 hours.  ABG No results found for: "PHART", "PCO2ART", "PO2ART", "HCO3", "TCO2", "ACIDBASEDEF", "O2SAT"   Coagulation Profile: No results for input(s): "INR", "PROTIME" in the last 168 hours.  Cardiac Enzymes: No results for input(s): "CKTOTAL", "CKMB", "CKMBINDEX", "TROPONINI" in the last 168 hours.  HbA1C: Hgb A1c MFr Bld  Date/Time Value Ref Range Status  12/14/2019 09:06 AM 5.5 <5.7 % of total Hgb Final    Comment:    For the purpose of screening for the presence of diabetes: . <5.7%       Consistent with the absence of diabetes 5.7-6.4%    Consistent with increased risk for diabetes             (prediabetes) > or =6.5%  Consistent with diabetes . This assay result is consistent with a decreased risk of diabetes. . Currently, no consensus exists regarding use of hemoglobin A1c for diagnosis of diabetes in children. . According to American Diabetes Association (ADA) guidelines, hemoglobin A1c <7.0% represents optimal control in non-pregnant diabetic patients. Different metrics may apply to specific patient populations.  Standards of Medical Care in Diabetes(ADA). .   12/07/2018 09:55 AM 5.4 <5.7 % of total Hgb Final    Comment:    For the purpose of screening for the presence of diabetes: . <5.7%       Consistent with the absence of diabetes 5.7-6.4%    Consistent  with increased risk for diabetes             (prediabetes) > or =6.5%  Consistent with diabetes . This assay result is consistent with a decreased risk of diabetes. . Currently, no consensus exists regarding use of hemoglobin A1c for diagnosis of diabetes in children. . According to American Diabetes Association (ADA) guidelines, hemoglobin A1c <7.0% represents optimal control in non-pregnant diabetic patients. Different metrics may apply to specific patient populations.  Standards of Medical Care in Diabetes(ADA). .     CBG: No results for input(s): "GLUCAP" in the last 168 hours.  Review of Systems:   Patient is encephalopathic and/or intubated. Therefore history has been obtained from chart review.    Past Medical History:  She,  has a past medical history of Arthritis, Colon polyp, gout, Hyperlipidemia, Hypertension, Hyperthyroidism, MVP (mitral valve prolapse), Renal insufficiency, and Thyroid disease.   Surgical History:   Past Surgical History:  Procedure Laterality Date   BACK SURGERY  2011   LOWER BACK FUSION L4-5   CHOLECYSTECTOMY     microcystic adenoma  02/12   pancreas   NECK SURGERY  2009   NECK FUSION C5-6; C6-7  TOTAL HIP ARTHROPLASTY  01/15/2012   Procedure: TOTAL HIP ARTHROPLASTY;  Surgeon: Gearlean Alf, MD;  Location: [facility];  Service: Orthopedics;  Laterality: Right;   TOTAL HIP ARTHROPLASTY  05/22/2012   Procedure: TOTAL HIP ARTHROPLASTY;  Surgeon: Gearlean Alf, MD;  Location: [facility];  Service: Orthopedics;  Laterality: Left;     Social History:   reports that she quit smoking about 57 years ago. Her smoking use included cigarettes. She smoked an average of .25 packs per day. She has never used smokeless tobacco. She reports that she does not drink alcohol and does not use drugs.   Family History:  Her family history includes Cancer in her brother and paternal aunt; Diabetes in her sister; Heart disease in her father.   Allergies Allergies   Allergen Reactions   Codeine     CAN TAKE SYNTHETIC     Home Medications  Prior to Admission medications   Medication Sig Start Date End Date Taking? Authorizing Provider  allopurinol (ZYLOPRIM) 100 MG tablet Take 1 tablet (100 mg total) by mouth daily. 06/11/22 12/21/22  Barb Merino, MD  amLODipine (NORVASC) 5 MG tablet Take 1 tablet (5 mg total) by mouth daily. 07/13/21   Elby Showers, MD  apixaban (ELIQUIS) 5 MG TABS tablet Take 1 tablet (5 mg total) by mouth 2 (two) times daily. 11/14/22   Caren Griffins, MD  buPROPion (WELLBUTRIN XL) 150 MG 24 hr tablet Take 150 mg by mouth every morning. 04/10/22   [provider]  levothyroxine (SYNTHROID) 50 MCG tablet TAKE 1 TABLET BY MOUTH DAILY ON EMPTY STOMACH WITHOUT FOOD OR OTHER MEDS *FOLLOW UP IN 6 WEEKS* 07/13/21   Elby Showers, MD  metoprolol tartrate (LOPRESSOR) 25 MG tablet Take 1 tablet (25 mg total) by mouth 2 (two) times daily. 11/14/22   Caren Griffins, MD  sertraline (ZOLOFT) 50 MG tablet Take 1 tablet (50 mg total) by mouth daily. 07/13/21   Elby Showers, MD     Critical care time: ***    ***

## 2022-12-14 NOTE — ED Notes (Signed)
Pt intubated. 7.5et tube, 25 '@teeth'$ 

## 2022-12-14 NOTE — ED Provider Notes (Signed)
Procedure Name: Intubation Date/Time: 12/06/2022 10:33 PM  Performed by: Tedd Sias, PAPre-anesthesia Checklist: Patient identified, Patient being monitored, Emergency Drugs available, Timeout performed and Suction available Oxygen Delivery Method: Non-rebreather mask Preoxygenation: Pre-oxygenation with 100% oxygen Induction Type: Rapid sequence Ventilation: Mask ventilation without difficulty Laryngoscope Size: Glidescope and 4 Tube size: 7.5 mm Number of attempts: 1 Placement Confirmation: ETT inserted through vocal cords under direct vision, CO2 detector and Breath sounds checked- equal and bilateral Secured at: 25 (upper teeth) cm Tube secured with: ETT holder     Intubation was completed with Dr. Ronnald Nian as my supervising physician.  Please see his separate note for induction medications.   Tedd Sias, [place] 12/03/2022 [place], [place], DO 11/15/2022 2331

## 2022-12-14 NOTE — ED Provider Notes (Signed)
[facility] EMERGENCY DEPARTMENT Provider Note   CSN: [id number] Arrival date & time: 11/21/2022  2158     History  Chief Complaint  Patient presents with   Respiratory Arrest    [name] is a 81 y.o. female.  Patient is here with respiratory distress.  Level 5 caveat as patient is obtunded.  History provided by EMS.  Diagnosed with COVID today.  History of A-fib on Eliquis.  They had her on oxygen at facility today and she was diagnosed with COVID.  She slowly got more short of breath and lethargic as the day went on.  When EMS got there she was having increased work of breathing, they tried her on BiPAP but she got obtunded and they started to bag her.  They are not aware of any other issues.  Likely fever today.  Overall history is limited.  She does have a MOST form that says she is full code.  The history is provided by the patient.       Home Medications Prior to Admission medications   Medication Sig Start Date End Date Taking? Authorizing Provider  allopurinol (ZYLOPRIM) 100 MG tablet Take 1 tablet (100 mg total) by mouth daily. 06/11/22 12/21/22  Barb Merino, MD  amLODipine (NORVASC) 5 MG tablet Take 1 tablet (5 mg total) by mouth daily. 07/13/21   Elby Showers, MD  apixaban (ELIQUIS) 5 MG TABS tablet Take 1 tablet (5 mg total) by mouth 2 (two) times daily. 11/14/22   Caren Griffins, MD  buPROPion (WELLBUTRIN XL) 150 MG 24 hr tablet Take 150 mg by mouth every morning. 04/10/22   [provider]  levothyroxine (SYNTHROID) 50 MCG tablet TAKE 1 TABLET BY MOUTH DAILY ON EMPTY STOMACH WITHOUT FOOD OR OTHER MEDS *FOLLOW UP IN 6 WEEKS* 07/13/21   Elby Showers, MD  metoprolol tartrate (LOPRESSOR) 25 MG tablet Take 1 tablet (25 mg total) by mouth 2 (two) times daily. 11/14/22   Caren Griffins, MD  sertraline (ZOLOFT) 50 MG tablet Take 1 tablet (50 mg total) by mouth daily. 07/13/21   Elby Showers, MD      Allergies    Codeine    Review of  Systems   Review of Systems  Physical Exam Updated Vital Signs BP (!) 109/91   Pulse (!) 130   Resp 20   Ht '5\' 4"'$  (1.626 m)   Wt 77 kg   SpO2 100%   BMI 29.14 kg/m  Physical Exam Vitals and nursing note reviewed.  Constitutional:      General: She is in acute distress.     Appearance: She is well-developed. She is ill-appearing.  HENT:     Head: Normocephalic and atraumatic.  Eyes:     Conjunctiva/sclera: Conjunctivae normal.     Pupils: Pupils are equal, round, and reactive to light.  Cardiovascular:     Rate and Rhythm: Regular rhythm. Tachycardia present.     Pulses: Normal pulses.     Heart sounds: No murmur heard. Pulmonary:     Effort: Pulmonary effort is normal. No respiratory distress.     Breath sounds: Normal breath sounds.  Abdominal:     Palpations: Abdomen is soft.  Musculoskeletal:        General: No swelling.     Cervical back: Neck supple.  Skin:    General: Skin is warm and dry.     Capillary Refill: Capillary refill takes less than 2 seconds.  Neurological:  GCS: GCS eye subscore is 1. GCS verbal subscore is 1.  Psychiatric:        Mood and Affect: Mood normal.     ED Results / Procedures / Treatments   Labs (all labs ordered are listed, but only abnormal results are displayed) Labs Reviewed  CBC WITH DIFFERENTIAL/PLATELET - Abnormal; Notable for the following components:      Result Value   WBC 17.9 (*)    HCT 48.3 (*)    Neutro Abs 14.3 (*)    Abs Immature Granulocytes 0.21 (*)    All other components within normal limits  D-DIMER, QUANTITATIVE - Abnormal; Notable for the following components:   D-Dimer, Quant 6.31 (*)    All other components within normal limits  LACTIC ACID, PLASMA - Abnormal; Notable for the following components:   Lactic Acid, Venous >9.0 (*)    All other components within normal limits  PROTIME-INR - Abnormal; Notable for the following components:   Prothrombin Time 32.0 (*)    INR 3.1 (*)    All other  components within normal limits  APTT - Abnormal; Notable for the following components:   aPTT 45 (*)    All other components within normal limits  RESP PANEL BY RT-PCR (RSV, FLU A&B, COVID)  RVPGX2  CULTURE, BLOOD (ROUTINE X 2)  CULTURE, BLOOD (ROUTINE X 2)  URINE CULTURE  MRSA NEXT GEN BY PCR, NASAL  CULTURE, RESPIRATORY W GRAM STAIN  RESPIRATORY PANEL BY PCR  COMPREHENSIVE METABOLIC PANEL  LIPASE, BLOOD  C-REACTIVE PROTEIN  URINALYSIS, ROUTINE W REFLEX MICROSCOPIC  MAGNESIUM  LACTIC ACID, PLASMA  LACTIC ACID, PLASMA  PROCALCITONIN  LEGIONELLA PNEUMOPHILA SEROGP 1 UR AG  STREP PNEUMONIAE URINARY ANTIGEN  BLOOD GAS, ARTERIAL  CBC  BASIC METABOLIC PANEL  TSH  AMMONIA  RAPID URINE DRUG SCREEN, HOSP PERFORMED  ETHANOL  I-STAT VENOUS BLOOD GAS, ED    EKG EKG Interpretation  Date/Time:  Saturday December 14 2022 22:01:27 EST Ventricular Rate:  141 PR Interval:  116 QRS Duration: 96 QT Interval:  394 QTC Calculation: 604 R Axis:   258 Text Interpretation: Supraventricular tachycardia Left anterior fascicular block Consider right ventricular hypertrophy Nonspecific T abnrm, anterolateral leads Confirmed by Lennice Sites ([id number]) on 12/09/2022 10:23:57 PM  Radiology DG Chest Portable 1 View  Result Date: 11/30/2022 CLINICAL DATA:  Status post intubation EXAM: PORTABLE CHEST 1 VIEW COMPARISON:  11/11/2022 FINDINGS: Endotracheal tube is noted in satisfactory position. Cardiac shadow is within normal limits. Lungs demonstrate patchy airspace opacity in the bases consistent with the given clinical history. No bony abnormality is noted. IMPRESSION: Patchy airspace opacities consistent with multifocal pneumonia. Endotracheal tube in satisfactory position. Electronically Signed   By: Inez Catalina M.D.   On: 12/06/2022 22:41    Procedures .Critical Care  Performed by: Lennice Sites, DO Authorized by: Lennice Sites, DO   Critical care provider statement:    Critical care time  (minutes):  45   Critical care was necessary to treat or prevent imminent or life-threatening deterioration of the following conditions:  Sepsis and respiratory failure   Critical care was time spent personally by me on the following activities:  Blood draw for specimens, development of treatment plan with patient or surrogate, discussions with primary provider, evaluation of patient's response to treatment, examination of patient, obtaining history from patient or surrogate, ordering and performing treatments and interventions, ordering and review of laboratory studies, ordering and review of radiographic studies, pulse oximetry, re-evaluation of patient's condition and review  of old charts   I assumed direction of critical care for this patient from another provider in my specialty: no       Medications Ordered in ED Medications  remdesivir 200 mg in sodium chloride 0.9% 250 mL IVPB (has no administration in time range)    Followed by  remdesivir 100 mg in sodium chloride 0.9 % 100 mL IVPB (has no administration in time range)  methylPREDNISolone sodium succinate (SOLU-MEDROL) injection 1 mg/kg (has no administration in time range)    Followed by  predniSONE (DELTASONE) tablet 50 mg (has no administration in time range)  docusate sodium (COLACE) capsule 100 mg (has no administration in time range)  polyethylene glycol (MIRALAX / GLYCOLAX) packet 17 g (has no administration in time range)  famotidine (PEPCID) tablet 20 mg (has no administration in time range)  ipratropium-albuterol (DUONEB) 0.5-2.5 (3) MG/3ML nebulizer solution 3 mL (has no administration in time range)  docusate (COLACE) 50 MG/5ML liquid 100 mg (has no administration in time range)  polyethylene glycol (MIRALAX / GLYCOLAX) packet 17 g (has no administration in time range)  fentaNYL (SUBLIMAZE) injection 25 mcg (has no administration in time range)  fentaNYL (SUBLIMAZE) injection 25-100 mcg (has no administration in time  range)  cefTRIAXone (ROCEPHIN) 2 g in sodium chloride 0.9 % 100 mL IVPB (has no administration in time range)  azithromycin (ZITHROMAX) 500 mg in sodium chloride 0.9 % 250 mL IVPB (has no administration in time range)  levothyroxine (SYNTHROID) tablet 50 mcg (has no administration in time range)  lactated ringers bolus 1,000 mL (1,000 mLs Intravenous New Bag/Given  2228)  lactated ringers bolus 1,000 mL (1,000 mLs Intravenous New Bag/Given 12/04/2022 2257)  acetaminophen (TYLENOL) suppository 650 mg (650 mg Rectal Given 11/16/2022 2302)  PHENYLephrine 80 mcg/ml in normal saline Adult IV Push Syringe (For Blood Pressure Support) (80 mcg Intravenous Given 12/11/2022 2258)    ED Course/ Medical Decision Making/ A&P                           Medical Decision Making Amount and/or Complexity of Data Reviewed Labs: ordered. Radiology: ordered. ECG/medicine tests: ordered.  Risk OTC drugs. Prescription drug management. Decision regarding hospitalization.   [name] is here with respiratory failure.  Patient arrives obtunded with a GCS of 3.  Pulse ox appears to be in the 80s on nonrebreather.  She is tachycardic in the 130s.  Appears to be a sinus tachycardia on EKG per my review and interpretation.  Blood pressure is 109/90.  EMS states she is tested positive for COVID today.  I talked on the phone with niece who states that she is full code.  Symptoms made for the last few days.  She supposedly was on oxygen today at facility and slowly got worse as the day went on.  She got up tender with EMS after being on BiPAP.  Patient was intubated by my PA with etomidate and succinylcholine.  She has some postintubation hypotension that responded to phenylephrine.  Chest x-ray per my review and interpretation showed adequate ETT placement but did also show may be multifocal pneumonia.  She has a white count of 18.  Sepsis protocol initiated and IV antibiotics to be started.  Patient to be admitted to  ICU team for further care.  Overall she is on blood thinners and seems like PE is less likely.  Suspect hypercarbic respiratory failure but she still very obtunded despite being intubated and  not sure if there is some anoxic injury or some sort of head process going on as well.  Will get a CT of the head.  Remaining blood work is pending and patient now under ICU team care.  This chart was dictated using voice recognition software.  Despite best efforts to proofread,  errors can occur which can change the documentation meaning.         Final Clinical Impression(s) / ED Diagnoses Final diagnoses:  Acute respiratory failure with hypoxia [facility])  COVID-19    Rx / DC Orders ED Discharge Orders     None         Lennice Sites, DO 11/26/2022 2330

## 2022-12-15 ENCOUNTER — Inpatient Hospital Stay (HOSPITAL_COMMUNITY): Payer: Medicare HMO

## 2022-12-15 DIAGNOSIS — A419 Sepsis, unspecified organism: Secondary | ICD-10-CM

## 2022-12-15 DIAGNOSIS — R6521 Severe sepsis with septic shock: Secondary | ICD-10-CM

## 2022-12-15 LAB — RESPIRATORY PANEL BY PCR

## 2022-12-15 LAB — BLOOD CULTURE ID PANEL (REFLEXED) - BCID2

## 2022-12-15 LAB — I-STAT ARTERIAL BLOOD GAS, ED
Acid-base deficit: 25 mmol/L — ABNORMAL HIGH (ref 0.0–2.0)
Bicarbonate: 7.2 mmol/L — ABNORMAL LOW (ref 20.0–28.0)
Calcium, Ion: 1.29 mmol/L (ref 1.15–1.40)
HCT: 34 % — ABNORMAL LOW (ref 36.0–46.0)
Hemoglobin: 11.6 g/dL — ABNORMAL LOW (ref 12.0–15.0)
O2 Saturation: 98 %
Patient temperature: 98.3
Potassium: 4.6 mmol/L (ref 3.5–5.1)
Sodium: 138 mmol/L (ref 135–145)
TCO2: 8 mmol/L — ABNORMAL LOW (ref 22–32)
pCO2 arterial: 36.5 mmHg (ref 32–48)
pH, Arterial: 6.902 — CL (ref 7.35–7.45)
pO2, Arterial: 185 mmHg — ABNORMAL HIGH (ref 83–108)

## 2022-12-15 LAB — I-STAT VENOUS BLOOD GAS, ED
Acid-base deficit: 25 mmol/L — ABNORMAL HIGH (ref 0.0–2.0)
Bicarbonate: 7.6 mmol/L — ABNORMAL LOW (ref 20.0–28.0)
Calcium, Ion: 1.26 mmol/L (ref 1.15–1.40)
HCT: 37 % (ref 36.0–46.0)
Hemoglobin: 12.6 g/dL (ref 12.0–15.0)
O2 Saturation: 73 %
Potassium: 4.6 mmol/L (ref 3.5–5.1)
Sodium: 139 mmol/L (ref 135–145)
TCO2: 9 mmol/L — ABNORMAL LOW (ref 22–32)
pCO2, Ven: 39 mmHg — ABNORMAL LOW (ref 44–60)
pH, Ven: 6.899 — CL (ref 7.25–7.43)
pO2, Ven: 64 mmHg — ABNORMAL HIGH (ref 32–45)

## 2022-12-15 LAB — RESP PANEL BY RT-PCR (RSV, FLU A&B, COVID)  RVPGX2
Influenza A by PCR: NEGATIVE
Influenza B by PCR: NEGATIVE
Resp Syncytial Virus by PCR: NEGATIVE
SARS Coronavirus 2 by RT PCR: POSITIVE — AB

## 2022-12-15 LAB — TSH: TSH: 1.345 u[IU]/mL (ref 0.350–4.500)

## 2022-12-15 LAB — URINALYSIS, ROUTINE W REFLEX MICROSCOPIC
Glucose, UA: NEGATIVE mg/dL
Ketones, ur: 15 mg/dL — AB
Nitrite: POSITIVE — AB
Protein, ur: 100 mg/dL — AB
Specific Gravity, Urine: >1.03 — ABNORMAL HIGH (ref 1.005–1.030)
pH: 5 (ref 5.0–8.0)

## 2022-12-15 LAB — GLUCOSE, CAPILLARY: Glucose-Capillary: 195 mg/dL — ABNORMAL HIGH (ref 70–99)

## 2022-12-15 LAB — AMMONIA: Ammonia: 33 umol/L (ref 9–35)

## 2022-12-15 LAB — URINALYSIS, MICROSCOPIC (REFLEX): WBC, UA: >50 WBC/hpf (ref 0–5)

## 2022-12-15 LAB — ETHANOL: Alcohol, Ethyl (B): <10 mg/dL (ref <10)

## 2022-12-15 LAB — LACTIC ACID, PLASMA: Lactic Acid, Venous: >9 mmol/L (ref 0.5–1.9)

## 2022-12-15 LAB — PROCALCITONIN: Procalcitonin: 52.72 ng/mL

## 2022-12-15 LAB — MAGNESIUM: Magnesium: 2 mg/dL (ref 1.7–2.4)

## 2022-12-15 LAB — CBG MONITORING, ED: Glucose-Capillary: 106 mg/dL — ABNORMAL HIGH (ref 70–99)

## 2022-12-15 MED ORDER — SODIUM BICARBONATE 8.4 % IV SOLN
INTRAVENOUS | Status: AC
  Filled 2022-12-15: qty 50

## 2022-12-15 MED ORDER — SODIUM BICARBONATE 8.4 % IV SOLN
100.0000 meq | Freq: Once | INTRAVENOUS | Status: AC
  Administered 2022-12-15: 100 meq via INTRAVENOUS
  Filled 2022-12-15: qty 50

## 2022-12-15 MED ORDER — LACTATED RINGERS IV BOLUS
1000.0000 mL | Freq: Once | INTRAVENOUS | Status: AC
  Administered 2022-12-15: 1000 mL via INTRAVENOUS

## 2022-12-15 MED ORDER — SODIUM BICARBONATE 8.4 % IV SOLN
INTRAVENOUS | Status: DC
  Filled 2022-12-15: qty 1000

## 2022-12-15 MED ORDER — NOREPINEPHRINE 4 MG/250ML-% IV SOLN: 0.0000 ug/min | INTRAVENOUS | Status: DC

## 2022-12-15 MED ORDER — VASOPRESSIN 20 UNITS/100 ML INFUSION FOR SHOCK
INTRAVENOUS | Status: AC
  Administered 2022-12-15: 0.04 [IU]/min via INTRAVENOUS
  Filled 2022-12-15: qty 100

## 2022-12-15 MED ORDER — SODIUM BICARBONATE 8.4 % IV SOLN
INTRAVENOUS | Status: AC
  Filled 2022-12-15: qty 150

## 2022-12-15 MED ORDER — NOREPINEPHRINE 4 MG/250ML-% IV SOLN: 2.0000 ug/min | INTRAVENOUS | Status: DC

## 2022-12-15 MED ORDER — NOREPINEPHRINE 4 MG/250ML-% IV SOLN
INTRAVENOUS | Status: AC
  Administered 2022-12-15: 2 ug/min via INTRAVENOUS
  Filled 2022-12-15: qty 250

## 2022-12-15 MED ORDER — INSULIN ASPART 100 UNIT/ML IJ SOLN: 0.0000 [IU] | INTRAMUSCULAR | Status: DC

## 2022-12-15 MED ORDER — SODIUM CHLORIDE 0.9 % IV SOLN: 250.0000 mL | INTRAVENOUS | Status: DC

## 2022-12-15 MED ORDER — VASOPRESSIN 20 UNITS/100 ML INFUSION FOR SHOCK: 0.0000 [IU]/min | INTRAVENOUS | Status: DC

## 2022-12-16 NOTE — Progress Notes (Signed)
PATIENT NAME: LARKEN URIAS MEDICAL RECORD NUMBER: [id number] Birthday: [birth year]  Age: 82 y.o. Admit Date: 11/25/2022  Provider: Andres Labrum PA-C  Indication: PEA arrest  Technical Description:  CPR performance duration: 16 minutes Was defibrillation or cardioversion used ? no Was external pacer placed ? no Was patient intubated pre/post CPR ? Already intubated Was transvenous pacer placed ? no  Medications Administered Include      Yes/no Amiodarone   Atropin   Calcium   Epinephrine yes  Lidocaine   Magnesium   Norepinephrine yes  Phenylephrine   Sodium bicarbonate yes  Vasopression yes   Evaluation Final Status - Was patient successfully resuscitated ? no If successfully resuscitated - what is current rhythm ? na If successfully resuscitated - what is current hemodynamic status ? na  Miscellaneous Information Patient transported to [facility] from ED. Shortly after arrival patient's bp worsened and went into PEA arrest at 0357. Epi given and was given 2 amps of bicarb. No shockable rhythm. Called patient's niece, [place] over phone and updated that patient was actively coding. Decision was made by niece to stop CPR. CPR stopped and TOD 0413.  JMick Sell101/10/24:19 AM

## 2022-12-16 NOTE — Progress Notes (Signed)
eLink Physician-Brief Progress Note Patient Name: [name] DOB: 1940-12-22 MRN: [id number]   Date of Service  12/23/2022  HPI/Events of Note  Camera'd into the room in response to code blue call, on arrival CPR / ACLS protocol in progress , PCCM ground crew in the room managing the code blue.  eICU Interventions  See above.        Kerry Kass Mackenna Kamer 12-23-2022, 4:09 AM

## 2022-12-16 NOTE — Progress Notes (Signed)
eLink Physician-Brief Progress Note Patient Name: LAVORA BRISBON DOB: March 21, 1941 MRN: [id number]   Date of Service  12-20-22  HPI/Events of Note  Patient admitted with Covid infection, severe bilateral pneumonia, septic shock, severe  metabolic acidosis, acute kidney  injury.  eICU Interventions  New Patient Evaluation.        Kerry Kass Kealy Lewter 20-Dec-2022, 4:19 AM

## 2022-12-16 NOTE — Procedures (Signed)
Central Venous Catheter Insertion Procedure Note  [name]  [id number]  March 26, 1941  Date:08-Jan-2023  Time:2:45 AM   Provider Performing:Tacha Manni D Rollene Rotunda   Procedure: Insertion of Non-tunneled Central Venous [id number]) with US guidance (60109)   Indication(s) Medication administration  Consent Risks of the procedure as well as the alternatives and risks of each were explained to the patient and/or caregiver.  Consent for the procedure was obtained and is signed in the bedside chart  Anesthesia Topical only with 1% lidocaine   Timeout Verified patient identification, verified procedure, site/side was marked, verified correct patient position, special equipment/implants available, medications/allergies/relevant history reviewed, required imaging and test results available.  Sterile Technique Maximal sterile technique including full sterile barrier drape, hand hygiene, sterile gown, sterile gloves, mask, hair covering, sterile ultrasound probe cover (if used).  Procedure Description Area of catheter insertion was cleaned with chlorhexidine and draped in sterile fashion.  With real-time ultrasound guidance a central venous catheter was placed into the left internal jugular vein. Nonpulsatile blood flow and easy flushing noted in all ports.  The catheter was sutured in place and sterile dressing applied.  Complications/Tolerance None; patient tolerated the procedure well. Chest X-ray is ordered to verify placement for internal jugular or subclavian cannulation.   Chest x-ray is not ordered for femoral cannulation.  EBL Minimal  Specimen(s) None  JD Rexene Agent [facility] 01-08-23, 2:46 AM  Please see [email or web address] for pager details.  From 7A-7P if no response, please call [phone]. After hours, please call ELink [phone].

## 2022-12-16 NOTE — Sepsis Progress Note (Signed)
Following for sepsis monitoring ?

## 2022-12-16 NOTE — Progress Notes (Signed)
   12-17-22 0400  Clinical Encounter Type  Visited With Patient not available;Health care provider  Visit Type Code;Death  Referral From Physician;Nurse (John D. Rollene Rotunda, PA; Alexa L. Bobby Rumpf, RN)  Consult/Referral To Chaplain Melvenia Beam)  Recommendations CODE BLUE > DEATH   Chaplain Responded to CODE BLUE Page. Upon arrival Medical Team providing Advanced Life Support Resuscitation Efforts. No Family Present. Andres Labrum and nurse communicated with patient's niece. It was agreed that resuscitation efforts were stopped and patient expired at 0413. Family was not coming to hospital at this time. No funeral Home selected at this time. [address] [place], [place] [phone]

## 2022-12-16 NOTE — Progress Notes (Signed)
Patient arrived to [facility] from ED. During admission, patient went into PEA arrest at 0357. Code blue was called and CPR was initiated. ACLS guidelines were followed. Patient's neice (point of contact) was called and decision was made to stop resuscitation efforts. TOD called at 0413.

## 2022-12-16 DEATH — deceased

## 2022-12-17 LAB — HEMOGLOBIN A1C
Hgb A1c MFr Bld: 5.7 % — ABNORMAL HIGH (ref 4.8–5.6)
Mean Plasma Glucose: 117 mg/dL

## 2022-12-17 LAB — URINE CULTURE: Culture: >=100000 — AB

## 2022-12-17 LAB — CULTURE, BLOOD (ROUTINE X 2)

## 2022-12-20 ENCOUNTER — Ambulatory Visit: Payer: PRIVATE HEALTH INSURANCE | Admitting: Physician Assistant

## 2023-01-08 MED FILL — Medication: Qty: 1 | Status: AC

## 2023-01-16 NOTE — Discharge Summary (Signed)
DEATH SUMMARY   Patient Details  Name: [name] MRN: [id number] DOB: December 27, 1940 RDE:[id number], Mauro Kaufmann, MD  Admission/Discharge Information   Admit Date:  26-Dec-2022  Date of Death: Date of Death: 12-27-22  Time of Death: Time of Death: 0413  Length of Stay: 1   Principle Cause of death: Septic shock Acute respiratory failure AKI  Hospital Diagnoses: Principal Problem:   Acute respiratory failure with hypoxia ([place]) Active Problems:   Septic shock ([place])  COVID infection MRSA bacteremia Urinary tract infection  History of Present Illness:  Patient is a 82 yo Female w/ pertinent PMH afib on eliquis, htn, hypothyroidism presents to [facility] on 12-27-23 w/ SOB.   Recently admitted 11/11/2022 after fall after syncope on right shoulder which was dislocated. Found to be in afib rvr with no prior history. Syncope thought related to orthostatic hypotension. Discharged 11/14/2022 to SNF.   Recently tested positive for covid. On 12-27-2023 patient w/ increased wob and hypoxia placed on nasal cannula. EMS arrived and placed patient on bipap. Transferred to [facility].   Upon arrival to [facility], patient obtunded , being bagged. Intubated for airway protection. CXR showing ETT good position and multifocal pneumonia. Labs/imaging pending. Started on remdesivir and steroids. Panculture sent. Started on broad spectrum abx. PCCM consulted for icu admission. Labs showed AKI with BUN/creatinine of 47/4.0 ABG showed severe metabolic acidosis 6.9 4/97/02, lactate was more than 9  Past Medical History:  Diagnosis Date   Arthritis    Colon polyp    Hx of gout    Hyperlipidemia    Hypertension    Hyperthyroidism    MVP (mitral valve prolapse)    Renal insufficiency    Thyroid disease     Hospital Course: She was treated with ceftriaxone and azithromycin.  Hypotension was treated with fluids and then pressors added.  Head CT did not show any evidence of CVA.  For severe acidosis, she was given 2 A of  bicarbonate and started on bicarbonate drip She arrived to the ICU and went into PEA arrest.  CPR was initiated, patient's niece was called who requested to stop resuscitation efforts. Blood cultures subsequently grew out MRSA.  Urine culture subsequently grew out Klebsiella and Proteus  Procedures: Left IJ central venous line. Endotracheal intubation  Consultations: NA  The results of significant diagnostics from this hospitalization (including imaging, microbiology, ancillary and laboratory) are listed below for reference.   Significant Diagnostic Studies: DG Chest Portable 1 View  Result Date: 12-27-22 CLINICAL DATA:  central line verification EXAM: PORTABLE CHEST 1 VIEW COMPARISON:  2022/12/26 FINDINGS: Left IJ approach central venous catheter tip is at the cavoatrial junction. Esophageal catheter tip is below the field of view. Endotracheal tube tip is at the level of the clavicular heads. Bibasilar atelectasis. IMPRESSION: Left IJ approach central venous catheter tip is at the cavoatrial junction. Electronically Signed   By: Ulyses Jarred M.D.   On: 12/27/22 03:11   CT HEAD WO CONTRAST (5MM)  Result Date: Dec 27, 2022 CLINICAL DATA:  Altered mental status, nontraumatic (Ped 0-17y) EXAM: CT HEAD WITHOUT CONTRAST TECHNIQUE: Contiguous axial images were obtained from the base of the skull through the vertex without intravenous contrast. RADIATION DOSE REDUCTION: This exam was performed according to the departmental dose-optimization program which includes automated exposure control, adjustment of the mA and/or kV according to patient size and/or use of iterative reconstruction technique. COMPARISON:  CT head 11/11/2022 FINDINGS: Brain: Cerebral ventricle sizes are concordant with the degree of cerebral volume loss. Patchy and  confluent areas of decreased attenuation are noted throughout the deep and periventricular white matter of the cerebral hemispheres bilaterally, compatible with chronic  microvascular ischemic disease. No evidence of large-territorial acute infarction. No parenchymal hemorrhage. No mass lesion. No extra-axial collection. No mass effect or midline shift. No hydrocephalus. Basilar cisterns are patent. Vascular: No hyperdense vessel. Skull: No acute fracture or focal lesion. Sinuses/Orbits: Left maxillary sinus mucosal thickening with associated sinus wall hypertrophy consistent with chronic appendicitis. Paranasal sinuses and mastoid air cells are clear. The orbits are unremarkable. Other: Enteric and endotracheal tubes partially visualized. IMPRESSION: 1. No acute intracranial abnormality. 2. Right maxillary chronic sinusitis. Correlate with signs and symptoms for acute sinusitis. Electronically Signed   By: Iven Finn M.D.   On: 03-Jan-2023 01:26   DG Abdomen 1 View  Result Date: 11/26/2022 CLINICAL DATA:  NG tube EXAM: ABDOMEN - 1 VIEW COMPARISON:  Chest x-ray 11/11/2022 FINDINGS: Esophageal tube tip and side port overlie the stomach. There is marked air distension of the stomach. There are multiple dilated air-filled loops of small bowel measuring up to 4.5 cm. IMPRESSION: 1. Esophageal tube tip and side port overlie the stomach. Marked air distension of the stomach. 2. Multiple dilated air-filled loops of small bowel either representing ileus or obstruction. Electronically Signed   By: Donavan Foil M.D.   On: 11/18/2022 23:53   DG Chest Portable 1 View  Result Date: 11/23/2022 CLINICAL DATA:  Status post intubation EXAM: PORTABLE CHEST 1 VIEW COMPARISON:  11/11/2022 FINDINGS: Endotracheal tube is noted in satisfactory position. Cardiac shadow is within normal limits. Lungs demonstrate patchy airspace opacity in the bases consistent with the given clinical history. No bony abnormality is noted. IMPRESSION: Patchy airspace opacities consistent with multifocal pneumonia. Endotracheal tube in satisfactory position. Electronically Signed   By: Inez Catalina M.D.   On:  12/07/2022 22:41   XR Shoulder Right  Result Date: 11/28/2022 Radiographs with a Y view of her right shoulder sella demonstrate humeral head to be reduced in the glenoid.  She does have some degenerative changes of the [facility] joint.  No noted any fracture   Microbiology: Recent Results (from the past 240 hour(s))  Resp panel by RT-PCR (RSV, Flu A&B, Covid) Anterior Nasal Swab     Status: Abnormal   Collection Time: 11/18/2022 10:19 PM   Specimen: Anterior Nasal Swab  Result Value Ref Range Status   SARS Coronavirus 2 by RT PCR POSITIVE (A) NEGATIVE Final    Comment: (NOTE) SARS-CoV-2 target nucleic acids are DETECTED.  The SARS-CoV-2 RNA is generally detectable in upper respiratory specimens during the acute phase of infection. Positive results are indicative of the presence of the identified virus, but do not rule out bacterial infection or co-infection with other pathogens not detected by the test. Clinical correlation with patient history and other diagnostic information is necessary to determine patient infection status. The expected result is Negative.  Fact Sheet for Patients: EntrepreneurPulse.com.au  Fact Sheet for Healthcare Providers: IncredibleEmployment.be  This test is not yet approved or cleared by the [place] FDA and  has been authorized for detection and/or diagnosis of SARS-CoV-2 by FDA under an Emergency Use Authorization (EUA).  This EUA will remain in effect (meaning this test can be used) for the duration of  the COVID-19 declaration under Section 564(b)(1) of the A ct, 21 U.S.C. section 360bbb-3(b)(1), unless the authorization is terminated or revoked sooner.     Influenza A by PCR NEGATIVE NEGATIVE Final   Influenza B by  PCR NEGATIVE NEGATIVE Final    Comment: (NOTE) The Xpert Xpress SARS-CoV-2/FLU/RSV plus assay is intended as an aid in the diagnosis of influenza from Nasopharyngeal swab specimens and should not  be used as a sole basis for treatment. Nasal washings and aspirates are unacceptable for Xpert Xpress SARS-CoV-2/FLU/RSV testing.  Fact Sheet for Patients: EntrepreneurPulse.com.au  Fact Sheet for Healthcare Providers: IncredibleEmployment.be  This test is not yet approved or cleared by the [place] FDA and has been authorized for detection and/or diagnosis of SARS-CoV-2 by FDA under an Emergency Use Authorization (EUA). This EUA will remain in effect (meaning this test can be used) for the duration of the COVID-19 declaration under Section 564(b)(1) of the Act, 21 U.S.C. section 360bbb-3(b)(1), unless the authorization is terminated or revoked.     Resp Syncytial Virus by PCR NEGATIVE NEGATIVE Final    Comment: (NOTE) Fact Sheet for Patients: EntrepreneurPulse.com.au  Fact Sheet for Healthcare Providers: IncredibleEmployment.be  This test is not yet approved or cleared by the [place] FDA and has been authorized for detection and/or diagnosis of SARS-CoV-2 by FDA under an Emergency Use Authorization (EUA). This EUA will remain in effect (meaning this test can be used) for the duration of the COVID-19 declaration under Section 564(b)(1) of the Act, 21 U.S.C. section 360bbb-3(b)(1), unless the authorization is terminated or revoked.  Performed at [facility], [place] [address], [place], [place] [address]   Blood culture (routine x 2)     Status: Abnormal (Preliminary result)   Collection Time: 11/17/2022 10:19 PM   Specimen: BLOOD  Result Value Ref Range Status   Specimen Description BLOOD RIGHT ANTECUBITAL  Final   Special Requests   Final    BOTTLES DRAWN AEROBIC AND ANAEROBIC Blood Culture results may not be optimal due to an excessive volume of blood received in culture bottles   Culture  Setup Time   Final    AEROBIC BOTTLE ONLY GRAM POSITIVE COCCI IN CLUSTERS Organism ID to  follow PATIENT DISCHARGED OR EXPIRED  CRITICAL NOT CALLED PT. EXPIRED     Culture (A)  Final    STAPHYLOCOCCUS AUREUS SUSCEPTIBILITIES TO FOLLOW Performed at [facility], [place] [address], [place], [place] [address]    Report Status PENDING  Incomplete  Respiratory (~20 pathogens) panel by PCR     Status: None   Collection Time: 11/30/2022 10:19 PM   Specimen: Nasopharyngeal Swab; Respiratory  Result Value Ref Range Status   Adenovirus NOT DETECTED NOT DETECTED Final   Coronavirus 229E NOT DETECTED NOT DETECTED Final    Comment: (NOTE) The Coronavirus on the Respiratory Panel, DOES NOT test for the novel  Coronavirus (2019 nCoV)    Coronavirus HKU1 NOT DETECTED NOT DETECTED Final   Coronavirus NL63 NOT DETECTED NOT DETECTED Final   Coronavirus OC43 NOT DETECTED NOT DETECTED Final   Metapneumovirus NOT DETECTED NOT DETECTED Final   Rhinovirus / Enterovirus NOT DETECTED NOT DETECTED Final   Influenza A NOT DETECTED NOT DETECTED Final   Influenza B NOT DETECTED NOT DETECTED Final   Parainfluenza Virus 1 NOT DETECTED NOT DETECTED Final   Parainfluenza Virus 2 NOT DETECTED NOT DETECTED Final   Parainfluenza Virus 3 NOT DETECTED NOT DETECTED Final   Parainfluenza Virus 4 NOT DETECTED NOT DETECTED Final   Respiratory Syncytial Virus NOT DETECTED NOT DETECTED Final   Bordetella pertussis NOT DETECTED NOT DETECTED Final   Bordetella Parapertussis NOT DETECTED NOT DETECTED Final   Chlamydophila pneumoniae NOT DETECTED NOT DETECTED  Final   Mycoplasma pneumoniae NOT DETECTED NOT DETECTED Final    Comment: Performed at [facility], [place] [address], [place], [place] [address]  Blood Culture ID Panel (Reflexed)     Status: Abnormal   Collection Time: 12/03/2022 10:19 PM  Result Value Ref Range Status   Enterococcus faecalis NOT DETECTED NOT DETECTED Final   Enterococcus Faecium NOT DETECTED NOT DETECTED Final   Listeria monocytogenes NOT DETECTED NOT DETECTED Final   Staphylococcus  species DETECTED (A) NOT DETECTED Final    Comment: PATIENT DISCHARGED OR EXPIRED   Staphylococcus aureus (BCID) DETECTED (A) NOT DETECTED Final    Comment: Methicillin (oxacillin)-resistant Staphylococcus aureus (MRSA). MRSA is predictably resistant to beta-lactam antibiotics (except ceftaroline). Preferred therapy is vancomycin unless clinically contraindicated. Patient requires contact precautions if  hospitalized. PATIENT DISCHARGED OR EXPIRED    Staphylococcus epidermidis NOT DETECTED NOT DETECTED Final   Staphylococcus lugdunensis NOT DETECTED NOT DETECTED Final   Streptococcus species NOT DETECTED NOT DETECTED Final   Streptococcus agalactiae NOT DETECTED NOT DETECTED Final   Streptococcus pneumoniae NOT DETECTED NOT DETECTED Final   Streptococcus pyogenes NOT DETECTED NOT DETECTED Final   A.calcoaceticus-baumannii NOT DETECTED NOT DETECTED Final   Bacteroides fragilis NOT DETECTED NOT DETECTED Final   Enterobacterales NOT DETECTED NOT DETECTED Final   Enterobacter cloacae complex NOT DETECTED NOT DETECTED Final   Escherichia coli NOT DETECTED NOT DETECTED Final   Klebsiella aerogenes NOT DETECTED NOT DETECTED Final   Klebsiella oxytoca NOT DETECTED NOT DETECTED Final   Klebsiella pneumoniae NOT DETECTED NOT DETECTED Final   Proteus species NOT DETECTED NOT DETECTED Final   Salmonella species NOT DETECTED NOT DETECTED Final   Serratia marcescens NOT DETECTED NOT DETECTED Final   Haemophilus influenzae NOT DETECTED NOT DETECTED Final   Neisseria meningitidis NOT DETECTED NOT DETECTED Final   Pseudomonas aeruginosa NOT DETECTED NOT DETECTED Final   Stenotrophomonas maltophilia NOT DETECTED NOT DETECTED Final   Candida albicans NOT DETECTED NOT DETECTED Final   Candida auris NOT DETECTED NOT DETECTED Final   Candida glabrata NOT DETECTED NOT DETECTED Final   Candida krusei NOT DETECTED NOT DETECTED Final   Candida parapsilosis NOT DETECTED NOT DETECTED Final   Candida tropicalis  NOT DETECTED NOT DETECTED Final   Cryptococcus neoformans/gattii NOT DETECTED NOT DETECTED Final   Meth resistant mecA/C and MREJ DETECTED (A) NOT DETECTED Final    Comment: PATIENT DISCHARGED OR EXPIRED Performed at [facility], [address] [place], [place] [address]   Urine Culture     Status: Abnormal (Preliminary result)   Collection Time: 11/26/2022 10:20 PM   Specimen: In/Out Cath Urine  Result Value Ref Range Status   Specimen Description IN/OUT CATH URINE  Final   Special Requests NONE  Final   Culture (A)  Final    >=100,000 COLONIES/mL KLEBSIELLA OXYTOCA >=100,000 COLONIES/mL PROTEUS MIRABILIS CULTURE REINCUBATED FOR BETTER GROWTH SUSCEPTIBILITIES TO FOLLOW Performed at [facility], [place] [address], [place], [place] [address]    Report Status PENDING  Incomplete      Signed: Leanna Sato. Elsworth Soho, MD
